# Patient Record
Sex: Male | Born: 1945
Health system: Southern US, Community
[De-identification: ages and names within clinical notes are randomized; demographics above are authoritative.]

## PROBLEM LIST (undated history)

## (undated) DIAGNOSIS — R7303 Prediabetes: Secondary | ICD-10-CM

## (undated) DIAGNOSIS — D494 Neoplasm of unspecified behavior of bladder: Secondary | ICD-10-CM

## (undated) DIAGNOSIS — M1990 Unspecified osteoarthritis, unspecified site: Secondary | ICD-10-CM

## (undated) DIAGNOSIS — K5790 Diverticulosis of intestine, part unspecified, without perforation or abscess without bleeding: Secondary | ICD-10-CM

## (undated) DIAGNOSIS — G4730 Sleep apnea, unspecified: Secondary | ICD-10-CM

## (undated) DIAGNOSIS — C801 Malignant (primary) neoplasm, unspecified: Secondary | ICD-10-CM

## (undated) DIAGNOSIS — K639 Disease of intestine, unspecified: Secondary | ICD-10-CM

## (undated) DIAGNOSIS — J189 Pneumonia, unspecified organism: Secondary | ICD-10-CM

## (undated) DIAGNOSIS — E785 Hyperlipidemia, unspecified: Secondary | ICD-10-CM

## (undated) DIAGNOSIS — I7121 Aneurysm of the ascending aorta, without rupture: Secondary | ICD-10-CM

## (undated) DIAGNOSIS — I10 Essential (primary) hypertension: Secondary | ICD-10-CM

## (undated) DIAGNOSIS — I2510 Atherosclerotic heart disease of native coronary artery without angina pectoris: Secondary | ICD-10-CM

## (undated) DIAGNOSIS — K5792 Diverticulitis of intestine, part unspecified, without perforation or abscess without bleeding: Secondary | ICD-10-CM

## (undated) DIAGNOSIS — I219 Acute myocardial infarction, unspecified: Secondary | ICD-10-CM

## (undated) HISTORY — PX: ABDOMINAL SURGERY: SHX537

## (undated) HISTORY — PX: COLOSTOMY: SHX63

## (undated) HISTORY — PX: APPENDECTOMY: SHX54

## (undated) HISTORY — PX: LUMBAR LAMINECTOMY: SHX95

## (undated) HISTORY — PX: NESBIT PROCEDURE: SHX2087

## (undated) HISTORY — PX: OTHER SURGICAL HISTORY: SHX169

---

## 1999-09-12 DIAGNOSIS — I471 Supraventricular tachycardia, unspecified: Secondary | ICD-10-CM

## 1999-09-12 HISTORY — DX: Supraventricular tachycardia: I47.1

## 1999-09-12 HISTORY — DX: Supraventricular tachycardia, unspecified: I47.10

## 2005-09-11 DIAGNOSIS — K5792 Diverticulitis of intestine, part unspecified, without perforation or abscess without bleeding: Secondary | ICD-10-CM

## 2005-09-11 HISTORY — DX: Diverticulitis of intestine, part unspecified, without perforation or abscess without bleeding: K57.92

## 2005-09-11 HISTORY — PX: COLOSTOMY REVERSAL: SHX5782

## 2005-09-11 HISTORY — PX: COLOSTOMY: SHX63

## 2005-09-11 HISTORY — PX: ABDOMINAL SURGERY: SHX537

## 2006-09-11 HISTORY — PX: HERNIA REPAIR: SHX51

## 2012-09-11 HISTORY — PX: HERNIA MESH REMOVAL: SHX1745

## 2018-07-05 ENCOUNTER — Inpatient Hospital Stay (HOSPITAL_COMMUNITY)
Admission: EM | Admit: 2018-07-05 | Discharge: 2018-07-08 | DRG: 389 | Disposition: A | Discharge status: Home / Self Care | Payer: Medicare HMO | Attending: Family Medicine | Admitting: Family Medicine

## 2018-07-05 DIAGNOSIS — Z0189 Encounter for other specified special examinations: Secondary | ICD-10-CM

## 2018-07-05 DIAGNOSIS — E860 Dehydration: Secondary | ICD-10-CM | POA: Diagnosis present

## 2018-07-05 DIAGNOSIS — Z7952 Long term (current) use of systemic steroids: Secondary | ICD-10-CM

## 2018-07-05 DIAGNOSIS — K5651 Intestinal adhesions [bands], with partial obstruction: Secondary | ICD-10-CM | POA: Diagnosis not present

## 2018-07-05 DIAGNOSIS — Z9049 Acquired absence of other specified parts of digestive tract: Secondary | ICD-10-CM

## 2018-07-05 DIAGNOSIS — I313 Pericardial effusion (noninflammatory): Secondary | ICD-10-CM

## 2018-07-05 DIAGNOSIS — Z87891 Personal history of nicotine dependence: Secondary | ICD-10-CM

## 2018-07-05 DIAGNOSIS — N401 Enlarged prostate with lower urinary tract symptoms: Secondary | ICD-10-CM | POA: Diagnosis present

## 2018-07-05 DIAGNOSIS — I10 Essential (primary) hypertension: Secondary | ICD-10-CM | POA: Diagnosis present

## 2018-07-05 DIAGNOSIS — I3139 Other pericardial effusion (noninflammatory): Secondary | ICD-10-CM

## 2018-07-05 DIAGNOSIS — N3289 Other specified disorders of bladder: Secondary | ICD-10-CM | POA: Diagnosis present

## 2018-07-05 DIAGNOSIS — Z79899 Other long term (current) drug therapy: Secondary | ICD-10-CM

## 2018-07-05 DIAGNOSIS — N329 Bladder disorder, unspecified: Secondary | ICD-10-CM | POA: Diagnosis present

## 2018-07-05 DIAGNOSIS — R739 Hyperglycemia, unspecified: Secondary | ICD-10-CM | POA: Diagnosis present

## 2018-07-05 DIAGNOSIS — Z933 Colostomy status: Secondary | ICD-10-CM

## 2018-07-05 DIAGNOSIS — N390 Urinary tract infection, site not specified: Secondary | ICD-10-CM | POA: Diagnosis present

## 2018-07-05 DIAGNOSIS — N179 Acute kidney failure, unspecified: Secondary | ICD-10-CM | POA: Diagnosis present

## 2018-07-05 DIAGNOSIS — E1165 Type 2 diabetes mellitus with hyperglycemia: Secondary | ICD-10-CM | POA: Diagnosis present

## 2018-07-05 DIAGNOSIS — K56609 Unspecified intestinal obstruction, unspecified as to partial versus complete obstruction: Secondary | ICD-10-CM | POA: Diagnosis present

## 2018-07-05 DIAGNOSIS — D72829 Elevated white blood cell count, unspecified: Secondary | ICD-10-CM | POA: Diagnosis present

## 2018-07-05 DIAGNOSIS — R1032 Left lower quadrant pain: Secondary | ICD-10-CM | POA: Diagnosis not present

## 2018-07-05 HISTORY — DX: Diverticulitis of intestine, part unspecified, without perforation or abscess without bleeding: K57.92

## 2018-07-05 HISTORY — DX: Essential (primary) hypertension: I10

## 2018-07-05 HISTORY — DX: Diverticulosis of intestine, part unspecified, without perforation or abscess without bleeding: K57.90

## 2018-07-06 ENCOUNTER — Emergency Department (HOSPITAL_COMMUNITY): Payer: Medicare HMO

## 2018-07-06 ENCOUNTER — Other Ambulatory Visit: Payer: Self-pay

## 2018-07-06 ENCOUNTER — Encounter (HOSPITAL_COMMUNITY): Payer: Self-pay | Admitting: Emergency Medicine

## 2018-07-06 ENCOUNTER — Inpatient Hospital Stay (HOSPITAL_COMMUNITY): Payer: Medicare HMO

## 2018-07-06 DIAGNOSIS — K56609 Unspecified intestinal obstruction, unspecified as to partial versus complete obstruction: Secondary | ICD-10-CM | POA: Diagnosis not present

## 2018-07-06 DIAGNOSIS — I10 Essential (primary) hypertension: Secondary | ICD-10-CM | POA: Diagnosis present

## 2018-07-06 DIAGNOSIS — D72829 Elevated white blood cell count, unspecified: Secondary | ICD-10-CM | POA: Diagnosis present

## 2018-07-06 DIAGNOSIS — I313 Pericardial effusion (noninflammatory): Secondary | ICD-10-CM | POA: Diagnosis present

## 2018-07-06 DIAGNOSIS — Z7952 Long term (current) use of systemic steroids: Secondary | ICD-10-CM | POA: Diagnosis not present

## 2018-07-06 DIAGNOSIS — R1032 Left lower quadrant pain: Secondary | ICD-10-CM | POA: Diagnosis present

## 2018-07-06 DIAGNOSIS — I361 Nonrheumatic tricuspid (valve) insufficiency: Secondary | ICD-10-CM | POA: Diagnosis not present

## 2018-07-06 DIAGNOSIS — Z9049 Acquired absence of other specified parts of digestive tract: Secondary | ICD-10-CM | POA: Diagnosis not present

## 2018-07-06 DIAGNOSIS — K5651 Intestinal adhesions [bands], with partial obstruction: Secondary | ICD-10-CM | POA: Diagnosis present

## 2018-07-06 DIAGNOSIS — Z933 Colostomy status: Secondary | ICD-10-CM | POA: Diagnosis not present

## 2018-07-06 DIAGNOSIS — Z87891 Personal history of nicotine dependence: Secondary | ICD-10-CM | POA: Diagnosis not present

## 2018-07-06 DIAGNOSIS — E860 Dehydration: Secondary | ICD-10-CM | POA: Diagnosis present

## 2018-07-06 DIAGNOSIS — N401 Enlarged prostate with lower urinary tract symptoms: Secondary | ICD-10-CM | POA: Diagnosis present

## 2018-07-06 DIAGNOSIS — N3289 Other specified disorders of bladder: Secondary | ICD-10-CM | POA: Diagnosis present

## 2018-07-06 DIAGNOSIS — N179 Acute kidney failure, unspecified: Secondary | ICD-10-CM | POA: Diagnosis present

## 2018-07-06 DIAGNOSIS — N390 Urinary tract infection, site not specified: Secondary | ICD-10-CM | POA: Diagnosis present

## 2018-07-06 DIAGNOSIS — E1165 Type 2 diabetes mellitus with hyperglycemia: Secondary | ICD-10-CM | POA: Diagnosis present

## 2018-07-06 DIAGNOSIS — R739 Hyperglycemia, unspecified: Secondary | ICD-10-CM | POA: Diagnosis present

## 2018-07-06 DIAGNOSIS — I3139 Other pericardial effusion (noninflammatory): Secondary | ICD-10-CM

## 2018-07-06 DIAGNOSIS — Z79899 Other long term (current) drug therapy: Secondary | ICD-10-CM | POA: Diagnosis not present

## 2018-07-06 DIAGNOSIS — N329 Bladder disorder, unspecified: Secondary | ICD-10-CM | POA: Diagnosis present

## 2018-07-06 LAB — COMPREHENSIVE METABOLIC PANEL
ALBUMIN: 4.6 g/dL (ref 3.5–5.0)
ALK PHOS: 93 U/L (ref 38–126)
ALT: 18 U/L (ref 0–44)
ANION GAP: 16 — AB (ref 5–15)
AST: 18 U/L (ref 15–41)
BILIRUBIN TOTAL: 1.1 mg/dL (ref 0.3–1.2)
BUN: 48 mg/dL — ABNORMAL HIGH (ref 8–23)
CHLORIDE: 94 mmol/L — AB (ref 98–111)
CO2: 27 mmol/L (ref 22–32)
Calcium: 11.1 mg/dL — ABNORMAL HIGH (ref 8.9–10.3)
Creatinine, Ser: 2.71 mg/dL — ABNORMAL HIGH (ref 0.61–1.24)
GFR calc non Af Amer: 22 mL/min — ABNORMAL LOW (ref >60)
GFR, EST AFRICAN AMERICAN: 26 mL/min — AB (ref >60)
Glucose, Bld: 188 mg/dL — ABNORMAL HIGH (ref 70–99)
Potassium: 4.8 mmol/L (ref 3.5–5.1)
SODIUM: 137 mmol/L (ref 135–145)
Total Protein: 8.6 g/dL — ABNORMAL HIGH (ref 6.5–8.1)

## 2018-07-06 LAB — BASIC METABOLIC PANEL
Anion gap: 14 (ref 5–15)
BUN: 61 mg/dL — AB (ref 8–23)
CALCIUM: 9.2 mg/dL (ref 8.9–10.3)
CHLORIDE: 101 mmol/L (ref 98–111)
CO2: 23 mmol/L (ref 22–32)
Creatinine, Ser: 4.04 mg/dL — ABNORMAL HIGH (ref 0.61–1.24)
GFR, EST AFRICAN AMERICAN: 16 mL/min — AB (ref >60)
GFR, EST NON AFRICAN AMERICAN: 14 mL/min — AB (ref >60)
Glucose, Bld: 122 mg/dL — ABNORMAL HIGH (ref 70–99)
POTASSIUM: 4.2 mmol/L (ref 3.5–5.1)
SODIUM: 138 mmol/L (ref 135–145)

## 2018-07-06 LAB — CBC WITH DIFFERENTIAL/PLATELET
ABS IMMATURE GRANULOCYTES: 0.07 10*3/uL (ref 0.00–0.07)
BASOS PCT: 0 %
Basophils Absolute: 0 10*3/uL (ref 0.0–0.1)
Eosinophils Absolute: 0.2 10*3/uL (ref 0.0–0.5)
Eosinophils Relative: 1 %
HCT: 42.1 % (ref 39.0–52.0)
HEMOGLOBIN: 13.3 g/dL (ref 13.0–17.0)
Immature Granulocytes: 1 %
LYMPHS PCT: 10 %
Lymphs Abs: 1.1 10*3/uL (ref 0.7–4.0)
MCH: 27.7 pg (ref 26.0–34.0)
MCHC: 31.6 g/dL (ref 30.0–36.0)
MCV: 87.5 fL (ref 80.0–100.0)
MONO ABS: 1 10*3/uL (ref 0.1–1.0)
Monocytes Relative: 8 %
NEUTROS PCT: 80 %
Neutro Abs: 9.3 10*3/uL — ABNORMAL HIGH (ref 1.7–7.7)
PLATELETS: 268 10*3/uL (ref 150–400)
RBC: 4.81 MIL/uL (ref 4.22–5.81)
RDW: 14.2 % (ref 11.5–15.5)
WBC: 11.6 10*3/uL — AB (ref 4.0–10.5)
nRBC: 0 % (ref 0.0–0.2)

## 2018-07-06 LAB — CBC
HEMATOCRIT: 45.7 % (ref 39.0–52.0)
Hemoglobin: 15 g/dL (ref 13.0–17.0)
MCH: 28.6 pg (ref 26.0–34.0)
MCHC: 32.8 g/dL (ref 30.0–36.0)
MCV: 87 fL (ref 80.0–100.0)
NRBC: 0 % (ref 0.0–0.2)
Platelets: 319 10*3/uL (ref 150–400)
RBC: 5.25 MIL/uL (ref 4.22–5.81)
RDW: 13.8 % (ref 11.5–15.5)
WBC: 18.9 10*3/uL — ABNORMAL HIGH (ref 4.0–10.5)

## 2018-07-06 LAB — CBG MONITORING, ED: GLUCOSE-CAPILLARY: 125 mg/dL — AB (ref 70–99)

## 2018-07-06 LAB — HEMOGLOBIN A1C
HEMOGLOBIN A1C: 6.4 % — AB (ref 4.8–5.6)
Mean Plasma Glucose: 136.98 mg/dL

## 2018-07-06 LAB — LIPASE, BLOOD: Lipase: 37 U/L (ref 11–51)

## 2018-07-06 MED ORDER — DIATRIZOATE MEGLUMINE & SODIUM 66-10 % PO SOLN
90.0000 mL | Freq: Once | ORAL | Status: AC
  Administered 2018-07-06: 90 mL via NASOGASTRIC
  Filled 2018-07-06: qty 90

## 2018-07-06 MED ORDER — FAMOTIDINE IN NACL 20-0.9 MG/50ML-% IV SOLN
20.0000 mg | Freq: Two times a day (BID) | INTRAVENOUS | Status: DC
  Administered 2018-07-06 – 2018-07-07 (×3): 20 mg via INTRAVENOUS
  Filled 2018-07-06 (×3): qty 50

## 2018-07-06 MED ORDER — LIDOCAINE VISCOUS HCL 2 % MT SOLN
OROMUCOSAL | Status: AC
  Filled 2018-07-06: qty 15

## 2018-07-06 MED ORDER — SODIUM CHLORIDE 0.9 % IV BOLUS
1000.0000 mL | Freq: Once | INTRAVENOUS | Status: AC
  Administered 2018-07-06: 1000 mL via INTRAVENOUS

## 2018-07-06 MED ORDER — LACTATED RINGERS IV BOLUS
1000.0000 mL | Freq: Once | INTRAVENOUS | Status: AC
  Administered 2018-07-06: 1000 mL via INTRAVENOUS

## 2018-07-06 MED ORDER — IOPAMIDOL (ISOVUE-300) INJECTION 61%
INTRAVENOUS | Status: AC
  Filled 2018-07-06: qty 50

## 2018-07-06 MED ORDER — HYDROMORPHONE HCL 1 MG/ML IJ SOLN
0.5000 mg | INTRAMUSCULAR | Status: AC | PRN
  Administered 2018-07-06: 1 mg via INTRAVENOUS
  Filled 2018-07-06: qty 1

## 2018-07-06 MED ORDER — ONDANSETRON HCL 4 MG/2ML IJ SOLN
4.0000 mg | Freq: Three times a day (TID) | INTRAMUSCULAR | Status: AC | PRN
  Administered 2018-07-06: 4 mg via INTRAVENOUS
  Filled 2018-07-06: qty 2

## 2018-07-06 MED ORDER — FAMOTIDINE IN NACL 20-0.9 MG/50ML-% IV SOLN
20.0000 mg | Freq: Once | INTRAVENOUS | Status: AC
  Administered 2018-07-06: 20 mg via INTRAVENOUS
  Filled 2018-07-06: qty 50

## 2018-07-06 MED ORDER — SODIUM CHLORIDE 0.9 % IV SOLN
INTRAVENOUS | Status: DC
  Administered 2018-07-06 – 2018-07-08 (×6): via INTRAVENOUS

## 2018-07-06 NOTE — ED Notes (Signed)
Pt states he is resting well. Denies any c/o nausea. States mild abd discomfort remains. Pt noted to be wearing hospital gown and eyeglasses.

## 2018-07-06 NOTE — ED Notes (Addendum)
X-Ray Tech transporting pt to X-ray for NG Tube Placement then will request Transport Team to transport pt to [phone]. Pt verified has all of his belongings - wearing his eyeglasses, clothing w/slippers, cell phone charger, and eyeglass case placed on bed per pt's request.

## 2018-07-06 NOTE — ED Provider Notes (Signed)
[facility] EMERGENCY DEPARTMENT Provider Note   CSN: [id number] Arrival date & time: 07/05/18  2348     History   Chief Complaint Chief Complaint  Patient presents with  . Abdominal Pain    concerned for bowel obstruction    HPI Richard Austin is a 72 y.o. male.  Patient is a 72 year old male who presents with abdominal pain.  He has had a prior history of diverticulitis as well as a prior history of small bowel obstruction.  He had multiple abdominal surgeries.  He complains of a 1 day history of pain in his left lower abdomen.  He has had vomiting which she describes as fecal matter.  He does have some diarrhea as well.  No fevers.  No hematemesis.  He states it feels similar to his prior small bowel obstructions.     Past Medical History:  Diagnosis Date  . Diverticulitis     Patient Active Problem List   Diagnosis Date Noted  . SBO (small bowel obstruction) ([place]) 07/06/2018    Past Surgical History:  Procedure Laterality Date  . ABDOMINAL SURGERY    . COLOSTOMY          Home Medications    Prior to Admission medications   Medication Sig Start Date End Date Taking? Authorizing Provider  chlorthalidone (HYGROTON) 25 MG tablet Take 25 mg by mouth daily.   Yes [provider]  fluticasone (FLONASE) 50 MCG/ACT nasal spray Place 1 spray into both nostrils daily as needed for allergies or rhinitis.   Yes [provider]  hydrocortisone 2.5 % cream Apply 1 application topically 2 (two) times daily.   Yes [provider]  Icosapent Ethyl (VASCEPA) 1 g CAPS Take 1 capsule by mouth 2 (two) times daily.   Yes [provider]  Javier Docker Oil 350 MG CAPS Take 350 mg by mouth 2 (two) times daily.   Yes [provider]  lisinopril (PRINIVIL,ZESTRIL) 40 MG tablet Take 40 mg by mouth daily.   Yes [provider]  meloxicam (MOBIC) 15 MG tablet Take 15 mg by mouth daily.   Yes [provider]    omeprazole (PRILOSEC) 20 MG capsule Take 40 mg by mouth daily as needed (acid reflux).   Yes [provider]  tadalafil (ADCIRCA/CIALIS) 20 MG tablet Take 20 mg by mouth daily as needed for erectile dysfunction.   Yes [provider]  tamsulosin (FLOMAX) 0.4 MG CAPS capsule Take 0.4 mg by mouth daily after supper.   Yes [provider]    Family History No family history on file.  Social History Social History   Tobacco Use  . Smoking status: Not on file  Substance Use Topics  . Alcohol use: Not Currently  . Drug use: Not Currently     Allergies   Amlodipine; Bystolic [nebivolol hcl]; Statins; and Latex   Review of Systems Review of Systems  Constitutional: Negative for chills, diaphoresis, fatigue and fever.  HENT: Negative for congestion, rhinorrhea and sneezing.   Eyes: Negative.   Respiratory: Negative for cough, chest tightness and shortness of breath.   Cardiovascular: Negative for chest pain and leg swelling.  Gastrointestinal: Positive for abdominal pain, nausea and vomiting. Negative for blood in stool and diarrhea.  Genitourinary: Negative for difficulty urinating, flank pain, frequency and hematuria.  Musculoskeletal: Negative for arthralgias and back pain.  Skin: Negative for rash.  Neurological: Negative for dizziness, speech difficulty, weakness, numbness and headaches.     Physical Exam  Updated Vital Signs BP 104/62 (BP Location: Right Arm)   Pulse 79   Temp 98.9 F (37.2 C)   Resp 18   Ht 5\' 8"  (1.727 m)   Wt 82.1 kg   SpO2 94%   BMI 27.52 kg/m   Physical Exam  Constitutional: He is oriented to person, place, and time. He appears well-developed and well-nourished.  HENT:  Head: Normocephalic and atraumatic.  Eyes: Pupils are equal, round, and reactive to light.  Neck: Normal range of motion. Neck supple.  Cardiovascular: Normal rate, regular rhythm and normal heart sounds.  Pulmonary/Chest: Effort normal and breath  sounds normal. No respiratory distress. He has no wheezes. He has no rales. He exhibits no tenderness.  Abdominal: Soft. Bowel sounds are normal. There is tenderness in the left lower quadrant. There is no rebound and no guarding.  Musculoskeletal: Normal range of motion. He exhibits no edema.  Lymphadenopathy:    He has no cervical adenopathy.  Neurological: He is alert and oriented to person, place, and time.  Skin: Skin is warm and dry. No rash noted.  Psychiatric: He has a normal mood and affect.     ED Treatments / Results  Labs (all labs ordered are listed, but only abnormal results are displayed) Labs Reviewed  COMPREHENSIVE METABOLIC PANEL - Abnormal; Notable for the following components:      Result Value   Chloride 94 (*)    Glucose, Bld 188 (*)    BUN 48 (*)    Creatinine, Ser 2.71 (*)    Calcium 11.1 (*)    Total Protein 8.6 (*)    GFR calc non Af Amer 22 (*)    GFR calc Af Amer 26 (*)    Anion gap 16 (*)    All other components within normal limits  CBC - Abnormal; Notable for the following components:   WBC 18.9 (*)    All other components within normal limits  LIPASE, BLOOD  URINALYSIS, ROUTINE W REFLEX MICROSCOPIC    EKG None  Radiology Ct Abdomen Pelvis Wo Contrast  Result Date: 07/06/2018 CLINICAL DATA:  72 year old male with left lower quadrant abdominal pain, nausea and vomiting. EXAM: CT ABDOMEN AND PELVIS WITHOUT CONTRAST TECHNIQUE: Multidetector CT imaging of the abdomen and pelvis was performed following the standard protocol without IV contrast. COMPARISON:  Abdominal radiograph dated 07/06/2018 FINDINGS: Evaluation of this exam is limited in the absence of intravenous contrast. Lower chest: There is a 6 mm right lower lobe subpleural nodule. The visualized lung bases are clear. Partially visualized pericardial effusion measuring 17 mm anterior to the heart. There is calcification of the mitral annulus. No intra-abdominal free air or free fluid.  Hepatobiliary: No focal liver abnormality is seen. No gallstones, gallbladder wall thickening, or biliary dilatation. Pancreas: Unremarkable. No pancreatic ductal dilatation or surrounding inflammatory changes. Spleen: Normal in size without focal abnormality. Adrenals/Urinary Tract: The adrenal glands are unremarkable. Multiple nonobstructing bilateral renal calculi measure up to 3-4 mm. No hydronephrosis. The visualized ureters appear unremarkable. There is a 2.3 x 1.7 cm partially calcified mass within the urinary bladder along the posterior wall adjacent to the right ureterovesical junction highly concerning for a bladder neoplasm. Further evaluation with cystoscopy is recommended. Stomach/Bowel: There is a small hiatal hernia. The stomach is distended with fluid content. There is dilatation of fluid-filled loops of proximal and mid small bowel measuring up to 4.3 cm in diameter. The distal small bowel loops are collapsed. A transition is noted in the left lower  quadrant. There are postsurgical changes of partial sigmoid resection with anastomotic suture. There is colonic diverticulosis without active inflammatory changes. Vascular/Lymphatic: Moderate aortoiliac atherosclerotic disease. No portal venous gas. There is no adenopathy. Reproductive: Mildly enlarged prostate gland measuring 5.3 cm in transverse diameter. Other: Fat containing left inguinal hernia with probable left inguinal hernia repair plug. Postsurgical changes and scarring of the midline anterior pelvic wall. Musculoskeletal: Degenerative changes of the spine. No acute osseous pathology. IMPRESSION: 1. Small-bowel obstruction with transition in the left lower quadrant, likely secondary to adhesions. 2. Partially visualized pericardial effusion measuring 17 mm anterior to the heart. 3. Bilateral nonobstructing renal calculi. No hydronephrosis. 4. Partially calcified mass in the urinary bladder concerning for bladder neoplasm. Further evaluation  with cystoscopy is recommended. 5. Colonic diverticulosis without active inflammatory changes. Electronically Signed   By: Anner Crete M.D.   On: 07/06/2018 05:54   Dg Abdomen 1 View  Result Date: 07/06/2018 CLINICAL DATA:  Abdominal pain EXAM: ABDOMEN - 1 VIEW COMPARISON:  None. FINDINGS: There is moderate stool in the colon. There is no bowel dilatation or air-fluid level to suggest bowel obstruction. No free air. There are surgical clips in the left abdomen. There are calcifications in the lower pelvis, likely of prostatic etiology. There is degenerative change in the lumbar spine. IMPRESSION: No evident bowel obstruction or free air. Moderate stool in colon. Prostatic calculi noted. Postoperative changes noted in left abdomen. Electronically Signed   By: Lowella Grip III M.D.   On: 07/06/2018 00:27    Procedures Procedures (including critical care time)  Medications Ordered in ED Medications  sodium chloride 0.9 % bolus 1,000 mL (has no administration in time range)  HYDROmorphone (DILAUDID) injection 0.5-1 mg (has no administration in time range)  ondansetron (ZOFRAN) injection 4 mg (has no administration in time range)     Initial Impression / Assessment and Plan / ED Course  I have reviewed the triage vital signs and the nursing notes.  Pertinent labs & imaging results that were available during my care of the patient were reviewed by me and considered in my medical decision making (see chart for details).     Patient presents with abdominal pain and vomiting.  CT scan shows evidence of a small bowel obstruction.  His labs show an acute kidney injury.  We do not have any old labs available for comparison but patient denies any known history of kidney dysfunction.  He was given IV fluids.  I spoke with Dr. Redmond Pulling with general surgery who will see the patient.  He requested medicine admission.  I spoke with Dr. Myna Hidalgo with the hospitalist service who will admit the patient for  further treatment.  NG tube was attempted by nursing and unsuccessful.  Final Clinical Impressions(s) / ED Diagnoses   Final diagnoses:  SBO (small bowel obstruction) ([place])  AKI (acute kidney injury) [facility])    ED Discharge Orders    None       Malvin Johns, MD 07/06/18 0710

## 2018-07-06 NOTE — Consult Note (Addendum)
[facility] Surgery Consult Note  Richard Austin [birth year]  [id number].    Requesting MD: Tamera Punt, MD Chief Complaint/Reason for Consult: SBO  HPI:  Richard Austin is a pleasant 72 year old male with a past medical history of hypertension, SBO, and diverticulitis who presented to [facility] emergency department with 24 hours of left lower quadrant abdominal pain.  Patient states that the pain started yesterday morning/afternoon.  Pain described as sharp, nonradiating, temporary relieved after vomiting.  He developed nausea, vomiting, and diarrhea last night.  Also endorses belching and denies flatus.  Patient reports similar episodes in the past, stating this is his fourth small bowel obstruction in the past 3 to 4 years.  He reports 2 previous hospitalizations in 2015 and 2016 for small bowel obstruction that resolved with nasogastric decompression and nonoperative management.    He reports a surgical history of sigmoid colectomy in 8250 complicated by anastomotic leak, take back to the OR and postop day 1 for colostomy. He also has a history of colostomy takedown. He developed a ventral hernia that was repaired with mesh and the mesh became chronically infected. His most recent abdominal surgery was an abdominal wall reconstruction in 2014 for his large ventral hernia.   The patient is a former smoker who quit roughly 15 years ago.  States he drinks alcohol occasionally but not daily.  Denies illicit drug use.  Is currently employed as a Presenter, broadcasting.  He and his wife moved to [place] in May 2019 from [place].  ROS: Review of Systems  Constitutional: Negative for chills and fever.  Gastrointestinal: Positive for abdominal pain, diarrhea, nausea and vomiting. Negative for blood in stool and melena.  All other systems reviewed and are negative.   History reviewed. No pertinent family history.  Past Medical History:  Diagnosis Date  . Diverticulitis   . Diverticulosis   .  Hypertension     Past Surgical History:  Procedure Laterality Date  . ABDOMINAL SURGERY    . COLOSTOMY      Social History:  reports that he drank alcohol. He reports that he has current or past drug history. His tobacco history is not on file.  Allergies:  Allergies  Allergen Reactions  . Amlodipine Other (See Comments)    Muscle tightness, fatigue   . Bystolic [Nebivolol Hcl] Other (See Comments)    Muscle tightness and fatigue   . Statins Other (See Comments)    Muscle tightness and fatigue   . Latex Rash     (Not in a hospital admission)  Blood pressure (!) 120/58, pulse 75, temperature 98.9 F (37.2 C), resp. rate 18, height _0  (1.727 m), weight 82.1 kg, SpO2 96 %. Physical Exam: Physical Exam  Constitutional: He is oriented to person, place, and time. He appears well-developed and well-nourished.  Non-toxic appearance. He does not appear ill.  HENT:  Head: Normocephalic and atraumatic.  Mouth/Throat: Oropharynx is clear and moist.  Eyes: Pupils are equal, round, and reactive to light. EOM are normal. No scleral icterus.  Cardiovascular: Normal rate, regular rhythm, normal heart sounds and intact distal pulses. Exam reveals no gallop and no friction rub.  No murmur heard. Pulmonary/Chest: Effort normal and breath sounds normal. No stridor. No respiratory distress. He has no wheezes. He has no rhonchi. He has no rales.  Abdominal: He exhibits distension. There is tenderness in the left lower quadrant. There is no rebound.  Tinkering bowel sounds in the right hemiabdomen; tender to palpation of left lower quadrant with  palpable underlying small bowel distention.  There is no peritonitis  Neurological: He is alert and oriented to person, place, and time.  Skin: Skin is warm and dry. No rash noted.  Psychiatric: He has a normal mood and affect. His behavior is normal.    Results for orders placed or performed during the hospital encounter of 07/05/18 (from the past 48  hour(s))  Lipase, blood     Status: None   Collection Time: 07/06/18 12:05 AM  Result Value Ref Range   Lipase 37 11 - 51 U/L    Comment: Performed at [facility], [address], [place], [place] [address]  Comprehensive metabolic panel     Status: Abnormal   Collection Time: 07/06/18 12:05 AM  Result Value Ref Range   Sodium 137 135 - 145 mmol/L   Potassium 4.8 3.5 - 5.1 mmol/L   Chloride 94 (L) 98 - 111 mmol/L   CO2 27 22 - 32 mmol/L   Glucose, Bld 188 (H) 70 - 99 mg/dL   BUN 48 (H) 8 - 23 mg/dL   Creatinine, Ser 2.71 (H) 0.61 - 1.24 mg/dL   Calcium 11.1 (H) 8.9 - 10.3 mg/dL   Total Protein 8.6 (H) 6.5 - 8.1 g/dL   Albumin 4.6 3.5 - 5.0 g/dL   AST 18 15 - 41 U/L   ALT 18 0 - 44 U/L   Alkaline Phosphatase 93 38 - 126 U/L   Total Bilirubin 1.1 0.3 - 1.2 mg/dL   GFR calc non Af Amer 22 (L) >60 mL/min   GFR calc Af Amer 26 (L) >60 mL/min    Comment: (NOTE) The eGFR has been calculated using the CKD EPI equation. This calculation has not been validated in all clinical situations. eGFR's persistently <60 mL/min signify possible Chronic Kidney Disease.    Anion gap 16 (H) 5 - 15    Comment: Performed at [facility], [place] [address] [place], [place] [address]  CBC     Status: Abnormal   Collection Time: 07/06/18 12:05 AM  Result Value Ref Range   WBC 18.9 (H) 4.0 - 10.5 K/uL   RBC 5.25 4.22 - 5.81 MIL/uL   Hemoglobin 15.0 13.0 - 17.0 g/dL   HCT 45.7 39.0 - 52.0 %   MCV 87.0 80.0 - 100.0 fL   MCH 28.6 26.0 - 34.0 pg   MCHC 32.8 30.0 - 36.0 g/dL   RDW 13.8 11.5 - 15.5 %   Platelets 319 150 - 400 K/uL   nRBC 0.0 0.0 - 0.2 %    Comment: Performed at [facility], [place] [address], [place], [place] [address]   Ct Abdomen Pelvis Wo Contrast  Result Date: 07/06/2018 CLINICAL DATA:  72 year old male with left lower quadrant abdominal pain, nausea and vomiting. EXAM: CT ABDOMEN AND PELVIS WITHOUT CONTRAST TECHNIQUE: Multidetector CT imaging of the abdomen and  pelvis was performed following the standard protocol without IV contrast. COMPARISON:  Abdominal radiograph dated 07/06/2018 FINDINGS: Evaluation of this exam is limited in the absence of intravenous contrast. Lower chest: There is a 6 mm right lower lobe subpleural nodule. The visualized lung bases are clear. Partially visualized pericardial effusion measuring 17 mm anterior to the heart. There is calcification of the mitral annulus. No intra-abdominal free air or free fluid. Hepatobiliary: No focal liver abnormality is seen. No gallstones, gallbladder wall thickening, or biliary dilatation. Pancreas: Unremarkable. No pancreatic ductal dilatation or surrounding inflammatory changes. Spleen: Normal in size without  focal abnormality. Adrenals/Urinary Tract: The adrenal glands are unremarkable. Multiple nonobstructing bilateral renal calculi measure up to 3-4 mm. No hydronephrosis. The visualized ureters appear unremarkable. There is a 2.3 x 1.7 cm partially calcified mass within the urinary bladder along the posterior wall adjacent to the right ureterovesical junction highly concerning for a bladder neoplasm. Further evaluation with cystoscopy is recommended. Stomach/Bowel: There is a small hiatal hernia. The stomach is distended with fluid content. There is dilatation of fluid-filled loops of proximal and mid small bowel measuring up to 4.3 cm in diameter. The distal small bowel loops are collapsed. A transition is noted in the left lower quadrant. There are postsurgical changes of partial sigmoid resection with anastomotic suture. There is colonic diverticulosis without active inflammatory changes. Vascular/Lymphatic: Moderate aortoiliac atherosclerotic disease. No portal venous gas. There is no adenopathy. Reproductive: Mildly enlarged prostate gland measuring 5.3 cm in transverse diameter. Other: Fat containing left inguinal hernia with probable left inguinal hernia repair plug. Postsurgical changes and scarring  of the midline anterior pelvic wall. Musculoskeletal: Degenerative changes of the spine. No acute osseous pathology. IMPRESSION: 1. Small-bowel obstruction with transition in the left lower quadrant, likely secondary to adhesions. 2. Partially visualized pericardial effusion measuring 17 mm anterior to the heart. 3. Bilateral nonobstructing renal calculi. No hydronephrosis. 4. Partially calcified mass in the urinary bladder concerning for bladder neoplasm. Further evaluation with cystoscopy is recommended. 5. Colonic diverticulosis without active inflammatory changes. Electronically Signed   By: Anner Crete M.D.   On: 07/06/2018 05:54   Dg Abdomen 1 View  Result Date: 07/06/2018 CLINICAL DATA:  Abdominal pain EXAM: ABDOMEN - 1 VIEW COMPARISON:  None. FINDINGS: There is moderate stool in the colon. There is no bowel dilatation or air-fluid level to suggest bowel obstruction. No free air. There are surgical clips in the left abdomen. There are calcifications in the lower pelvis, likely of prostatic etiology. There is degenerative change in the lumbar spine. IMPRESSION: No evident bowel obstruction or free air. Moderate stool in colon. Prostatic calculi noted. Postoperative changes noted in left abdomen. Electronically Signed   By: Lowella Grip III M.D.   On: 07/06/2018 00:27   Assessment/Plan HTN PMH Diverticulitis Bladder mass - will need urology F/U  AKI - Cr 2.7 per primary team Leukocytosis  SBO, recurrent, suspect secondary to adhesive disease - past abd surgeries:  Sigmoid colectomy >>leak>>colostomy in 2007, ventral hernia repair with mesh >>mesh infection>>persistent hernia, colostomy takedown, abdominal wall reconstruction 2014 by plastic surgery. - afebrile, VSS - CT abd/pelv 10/26 - small HH, SBO w/ transition zone LLQ, fat containing LIH - place NG tube and start small bowel protocol; initial NG tube placement in the ER failed.  I have asked another nurse to attempt NG  placement if this fails I recommend sending the patient to fluoroscopy for placement of nasogastric tube. -General surgery will follow  FEN - NPO. IVF, NG to LIWS ID - no abx recommended from surgical perspective VTE - SCDs, ok for chemical VTE from [place], [facility] Surgery 07/06/2018, 8:24 AM Pager: [phone] Consults: [phone]

## 2018-07-06 NOTE — ED Triage Notes (Addendum)
Pt reports L sided abd pain relieved with pressure applied to abd, emesis and distention starting today. States he thinks he was vomiting stool earlier today. Hs extensive bowel reconstruction and diverticulitis, bowel obstruction in the past. Last normal BM this morning. Poor PO intake today.

## 2018-07-06 NOTE — ED Notes (Signed)
Attempted NG tube x3 without success. MD Belfi made aware.

## 2018-07-06 NOTE — ED Notes (Signed)
Gen surg rounding

## 2018-07-06 NOTE — H&P (Addendum)
History and Physical    Richard Austin VVO:[id number] DOB: June 08, 1946 DOA: 07/05/2018  PCP: Pa, Eunice   Patient coming from: Home.  I have personally briefly reviewed patient's old medical records in [place]  Chief Complaint: Abdominal pain.  HPI: Richard Austin is a 72 y.o. male with medical history significant of diverticulosis, history of diverticulitis, history of SBO x3 in the past, hypertension who is coming to the emergency department with complaints of abdominal pain since yesterday about 1000 associated with nausea, 10-15 episodes of emesis and 3-4 episodes of diarrhea.  He states that he felt nauseous most of the day on Friday at work, but did not produce emesis until he started driving home, but had to pulled over and vomit.  Since then, he has had at least 10 more episodes of emesis.  He also started having diarrhea.  He mentions that his last regular/normal bowel movement was yesterday morning.  He denies fever, chills, sore throat, rhinorrhea, dyspnea, wheezing, hemoptysis, chest pain, palpitations, dizziness, diaphoresis, PND, orthopnea or pitting edema of the lower extremities.  No melena or hematochezia.  Denies dysuria, frequency or hematuria, but states that he is urinating less volume than usual.  His urine looks darker than usual.  He denies polyuria, polydipsia, polyphagia or blurred vision.  No heat or cold intolerance and denies skin pruritus.  ED Course: Initial vital signs temperature 90 F, pulse 100, respirations 18, blood pressure 122/82 mmHg and O2 sat 96% on room air.  The patient received a 1000 mL NS bolus and famotidine 20 mg IVPB in the emergency department.  His white count was 18.9, hemoglobin 15.0 g/dL and platelets 319.  CMP shows a chloride of 94 mmol/L.  Glucose 188, BUN 48, creatinine 2.71 and calcium 11.6 mg/dL.  Total protein was 8.6 g/dL, but this is secondary to hemoconcentration.  Lipase was 37.  Imaging: shows SBO with  transition in the left lower quadrant likely secondary to adhesions.  There was a partially visualized pericardial effusion in the anterior heart.  Please see images and full radiology report for further detail.  There is a partially calcified mass in the urinary bladder concerning for bladder neoplasm.  Review of Systems: As per HPI otherwise 10 point review of systems negative.   Past Medical History:  Diagnosis Date  . Diverticulitis   . Diverticulosis   . Hypertension     Past Surgical History:  Procedure Laterality Date  . ABDOMINAL SURGERY    . APPENDECTOMY  1970s  . COLOSTOMY    . LUMBAR LAMINECTOMY  1970s     reports that he drank alcohol. He reports that he has current or past drug history. His tobacco history is not on file.  Allergies  Allergen Reactions  . Amlodipine Other (See Comments)    Muscle tightness, fatigue   . Bystolic [Nebivolol Hcl] Other (See Comments)    Muscle tightness and fatigue   . Statins Other (See Comments)    Muscle tightness and fatigue   . Latex Rash    Family History  Problem Relation Age of Onset  . Peptic Ulcer Mother   . Cirrhosis Mother   . Alcohol abuse Mother   . Bladder Cancer Father   . Throat cancer Maternal Grandfather    Prior to Admission medications   Medication Sig Start Date End Date Taking? Authorizing Provider  chlorthalidone (HYGROTON) 25 MG tablet Take 25 mg by mouth daily.   Yes [provider]  fluticasone Asencion Islam)  50 MCG/ACT nasal spray Place 1 spray into both nostrils daily as needed for allergies or rhinitis.   Yes [provider]  hydrocortisone 2.5 % cream Apply 1 application topically 2 (two) times daily.   Yes [provider]  Icosapent Ethyl (VASCEPA) 1 g CAPS Take 1 capsule by mouth 2 (two) times daily.   Yes [provider]  Javier Docker Oil 350 MG CAPS Take 350 mg by mouth 2 (two) times daily.   Yes [provider]  lisinopril (PRINIVIL,ZESTRIL) 40 MG tablet Take  40 mg by mouth daily.   Yes [provider]  meloxicam (MOBIC) 15 MG tablet Take 15 mg by mouth daily.   Yes [provider]  omeprazole (PRILOSEC) 20 MG capsule Take 40 mg by mouth daily as needed (acid reflux).   Yes [provider]  tadalafil (ADCIRCA/CIALIS) 20 MG tablet Take 20 mg by mouth daily as needed for erectile dysfunction.   Yes [provider]  tamsulosin (FLOMAX) 0.4 MG CAPS capsule Take 0.4 mg by mouth daily after supper.   Yes [provider]    Physical Exam: Vitals:   07/06/18 0615 07/06/18 0715 07/06/18 0730 07/06/18 0800  BP: (!) 104/57 116/63 126/63 (!) 120/58  Pulse: 81 77 75 75  Resp:      Temp:      TempSrc:      SpO2: 97% 96% 97% 96%  Weight:      Height:        Constitutional: NAD, calm, comfortable Eyes: PERRL, lids and conjunctivae normal ENMT: Mucous membranes are mildly dry. Posterior pharynx clear of any exudate or lesions. Neck: normal, supple, no masses, no thyromegaly Respiratory: Clear to auscultation bilaterally, no wheezing, no crackles. Normal respiratory effort. No accessory muscle use.  Cardiovascular: Regular rate and rhythm, no murmurs / rubs / gallops. No extremity edema. 2+ pedal pulses. No carotid bruits.  Abdomen: Distended, positive left quadrants tenderness, no guarding or rebound.  No masses palpated. No hepatosplenomegaly. Bowel sounds positive.  Musculoskeletal: no clubbing / cyanosis. No joint deformity upper and lower extremities. Good ROM, no contractures. Normal muscle tone.  Skin: no rashes, lesions, ulcers. No induration Neurologic: CN 2-12 grossly intact. Sensation intact, DTR normal. Strength 5/5 in all 4.  Psychiatric: Normal judgment and insight. Alert and oriented x 3. Normal mood.   Labs on Admission: I have personally reviewed following labs and imaging studies  CBC: Recent Labs  Lab 07/06/18 0005  WBC 18.9*  HGB 15.0  HCT 45.7  MCV 87.0  PLT 878   Basic Metabolic  Panel: Recent Labs  Lab 07/06/18 0005  NA 137  K 4.8  CL 94*  CO2 27  GLUCOSE 188*  BUN 48*  CREATININE 2.71*  CALCIUM 11.1*   GFR: Estimated Creatinine Clearance: 26.1 mL/min (A) (by C-G formula based on SCr of 2.71 mg/dL (H)). Liver Function Tests: Recent Labs  Lab 07/06/18 0005  AST 18  ALT 18  ALKPHOS 93  BILITOT 1.1  PROT 8.6*  ALBUMIN 4.6   Recent Labs  Lab 07/06/18 0005  LIPASE 37   No results for input(s): AMMONIA in the last 168 hours. Coagulation Profile: No results for input(s): INR, PROTIME in the last 168 hours. Cardiac Enzymes: No results for input(s): CKTOTAL, CKMB, CKMBINDEX, TROPONINI in the last 168 hours. BNP (last 3 results) No results for input(s): PROBNP in the last 8760 hours. HbA1C: No results for input(s): HGBA1C in the last 72 hours. CBG: Recent Labs  Lab  07/06/18 1009  GLUCAP 125*   Lipid Profile: No results for input(s): CHOL, HDL, LDLCALC, TRIG, CHOLHDL, LDLDIRECT in the last 72 hours. Thyroid Function Tests: No results for input(s): TSH, T4TOTAL, FREET4, T3FREE, THYROIDAB in the last 72 hours. Anemia Panel: No results for input(s): VITAMINB12, FOLATE, FERRITIN, TIBC, IRON, RETICCTPCT in the last 72 hours. Urine analysis: No results found for: COLORURINE, APPEARANCEUR, LABSPEC, PHURINE, GLUCOSEU, HGBUR, BILIRUBINUR, KETONESUR, PROTEINUR, UROBILINOGEN, NITRITE, LEUKOCYTESUR  Radiological Exams on Admission: Ct Abdomen Pelvis Wo Contrast  Result Date: 07/06/2018 CLINICAL DATA:  72 year old male with left lower quadrant abdominal pain, nausea and vomiting. EXAM: CT ABDOMEN AND PELVIS WITHOUT CONTRAST TECHNIQUE: Multidetector CT imaging of the abdomen and pelvis was performed following the standard protocol without IV contrast. COMPARISON:  Abdominal radiograph dated 07/06/2018 FINDINGS: Evaluation of this exam is limited in the absence of intravenous contrast. Lower chest: There is a 6 mm right lower lobe subpleural nodule. The  visualized lung bases are clear. Partially visualized pericardial effusion measuring 17 mm anterior to the heart. There is calcification of the mitral annulus. No intra-abdominal free air or free fluid. Hepatobiliary: No focal liver abnormality is seen. No gallstones, gallbladder wall thickening, or biliary dilatation. Pancreas: Unremarkable. No pancreatic ductal dilatation or surrounding inflammatory changes. Spleen: Normal in size without focal abnormality. Adrenals/Urinary Tract: The adrenal glands are unremarkable. Multiple nonobstructing bilateral renal calculi measure up to 3-4 mm. No hydronephrosis. The visualized ureters appear unremarkable. There is a 2.3 x 1.7 cm partially calcified mass within the urinary bladder along the posterior wall adjacent to the right ureterovesical junction highly concerning for a bladder neoplasm. Further evaluation with cystoscopy is recommended. Stomach/Bowel: There is a small hiatal hernia. The stomach is distended with fluid content. There is dilatation of fluid-filled loops of proximal and mid small bowel measuring up to 4.3 cm in diameter. The distal small bowel loops are collapsed. A transition is noted in the left lower quadrant. There are postsurgical changes of partial sigmoid resection with anastomotic suture. There is colonic diverticulosis without active inflammatory changes. Vascular/Lymphatic: Moderate aortoiliac atherosclerotic disease. No portal venous gas. There is no adenopathy. Reproductive: Mildly enlarged prostate gland measuring 5.3 cm in transverse diameter. Other: Fat containing left inguinal hernia with probable left inguinal hernia repair plug. Postsurgical changes and scarring of the midline anterior pelvic wall. Musculoskeletal: Degenerative changes of the spine. No acute osseous pathology. IMPRESSION: 1. Small-bowel obstruction with transition in the left lower quadrant, likely secondary to adhesions. 2. Partially visualized pericardial effusion  measuring 17 mm anterior to the heart. 3. Bilateral nonobstructing renal calculi. No hydronephrosis. 4. Partially calcified mass in the urinary bladder concerning for bladder neoplasm. Further evaluation with cystoscopy is recommended. 5. Colonic diverticulosis without active inflammatory changes. Electronically Signed   By: Anner Crete M.D.   On: 07/06/2018 05:54   Dg Abdomen 1 View  Result Date: 07/06/2018 CLINICAL DATA:  Abdominal pain EXAM: ABDOMEN - 1 VIEW COMPARISON:  None. FINDINGS: There is moderate stool in the colon. There is no bowel dilatation or air-fluid level to suggest bowel obstruction. No free air. There are surgical clips in the left abdomen. There are calcifications in the lower pelvis, likely of prostatic etiology. There is degenerative change in the lumbar spine. IMPRESSION: No evident bowel obstruction or free air. Moderate stool in colon. Prostatic calculi noted. Postoperative changes noted in left abdomen. Electronically Signed   By: Lowella Grip III M.D.   On: 07/06/2018 00:27    EKG: Independently reviewed.  Assessment/Plan Principal Problem:   SBO (small bowel obstruction) (HCC) Admit to MedSurg/inpatient. Continue IV fluids. Continue NGT suction. Monitor intake and output. Monitor WBC, electrolytes and renal function. General surgery is following.  Active Problems:   AKI (acute kidney injury) ([place]) Continue IV fluids. Monitor intake and output. Follow-up BUN and creatinine. Consult nephrology if he worsens.    Pericardial effusion Check echocardiogram in a.m.    Bladder mass Will need cytoscopy at some point, may be transferred to [facility] for urology to evaluate once the SBO has resolved and he has been cleared by surgery.  His father had bladder CA.    Hypercalcemia I suspect this is due to dehydration and hemoconcentration. Continue IV fluids. Follow-up calcium level. Consider further work-up if hypercalcemia persists.     Hyperglycemia Currently n.p.o. Check hemoglobin A1c.    Hypertension Hold lisinopril. Monitor blood pressure, renal function electrolytes..    Leukocytosis Secondary to hemoconcentration. Continue IV hydration. Follow WBC.   DVT prophylaxis: SCDs. Code Status: Full code. Family Communication: His wife was present in the ED room Disposition Plan: Admit for NGT suction, IV hydration and symptoms management. Consults called: General surgery. Admission status: Inpatient/MedSurg.   Reubin Milan MD [facility] Pager [phone]  If 7PM-7AM, please contact night-coverage [email or web address] Password [email or web address]  07/06/2018, 10:41 AM

## 2018-07-06 NOTE — ED Notes (Signed)
Dr. Olevia Bowens rounding

## 2018-07-06 NOTE — ED Notes (Signed)
Pt arrived to [facility] via stretcher. Pt ambulated to bed w/o difficulty. Spouse at bedside.

## 2018-07-07 LAB — CBC WITH DIFFERENTIAL/PLATELET
Abs Immature Granulocytes: 0.04 10*3/uL (ref 0.00–0.07)
BASOS ABS: 0.1 10*3/uL (ref 0.0–0.1)
BASOS PCT: 1 %
Eosinophils Absolute: 0.4 10*3/uL (ref 0.0–0.5)
Eosinophils Relative: 4 %
HCT: 41.7 % (ref 39.0–52.0)
HEMOGLOBIN: 13 g/dL (ref 13.0–17.0)
IMMATURE GRANULOCYTES: 0 %
LYMPHS PCT: 10 %
Lymphs Abs: 1 10*3/uL (ref 0.7–4.0)
MCH: 27.6 pg (ref 26.0–34.0)
MCHC: 31.2 g/dL (ref 30.0–36.0)
MCV: 88.5 fL (ref 80.0–100.0)
MONO ABS: 0.8 10*3/uL (ref 0.1–1.0)
Monocytes Relative: 8 %
NEUTROS ABS: 7.5 10*3/uL (ref 1.7–7.7)
NEUTROS PCT: 77 %
NRBC: 0 % (ref 0.0–0.2)
PLATELETS: 246 10*3/uL (ref 150–400)
RBC: 4.71 MIL/uL (ref 4.22–5.81)
RDW: 14.2 % (ref 11.5–15.5)
WBC: 9.8 10*3/uL (ref 4.0–10.5)

## 2018-07-07 LAB — COMPREHENSIVE METABOLIC PANEL
ALBUMIN: 3.3 g/dL — AB (ref 3.5–5.0)
ALK PHOS: 73 U/L (ref 38–126)
ALT: 15 U/L (ref 0–44)
AST: 13 U/L — AB (ref 15–41)
Anion gap: 12 (ref 5–15)
BUN: 67 mg/dL — ABNORMAL HIGH (ref 8–23)
CALCIUM: 8.9 mg/dL (ref 8.9–10.3)
CHLORIDE: 103 mmol/L (ref 98–111)
CO2: 26 mmol/L (ref 22–32)
CREATININE: 3.86 mg/dL — AB (ref 0.61–1.24)
GFR calc non Af Amer: 14 mL/min — ABNORMAL LOW (ref >60)
GFR, EST AFRICAN AMERICAN: 17 mL/min — AB (ref >60)
GLUCOSE: 103 mg/dL — AB (ref 70–99)
Potassium: 4 mmol/L (ref 3.5–5.1)
Sodium: 141 mmol/L (ref 135–145)
Total Bilirubin: 1.1 mg/dL (ref 0.3–1.2)
Total Protein: 5.9 g/dL — ABNORMAL LOW (ref 6.5–8.1)

## 2018-07-07 LAB — URINALYSIS, ROUTINE W REFLEX MICROSCOPIC
Bilirubin Urine: NEGATIVE
GLUCOSE, UA: NEGATIVE mg/dL
Ketones, ur: NEGATIVE mg/dL
Nitrite: NEGATIVE
PROTEIN: 30 mg/dL — AB
Specific Gravity, Urine: 1.017 (ref 1.005–1.030)
WBC, UA: >50 WBC/hpf — ABNORMAL HIGH (ref 0–5)
pH: 5 (ref 5.0–8.0)

## 2018-07-07 LAB — GLUCOSE, CAPILLARY
GLUCOSE-CAPILLARY: 73 mg/dL (ref 70–99)
GLUCOSE-CAPILLARY: 93 mg/dL (ref 70–99)

## 2018-07-07 MED ORDER — TAMSULOSIN HCL 0.4 MG PO CAPS
0.4000 mg | ORAL_CAPSULE | Freq: Every day | ORAL | Status: DC
  Administered 2018-07-07: 0.4 mg via ORAL
  Filled 2018-07-07: qty 1

## 2018-07-07 MED ORDER — ACETAMINOPHEN 325 MG PO TABS
650.0000 mg | ORAL_TABLET | Freq: Four times a day (QID) | ORAL | Status: DC | PRN
  Administered 2018-07-07: 650 mg via ORAL
  Filled 2018-07-07: qty 2

## 2018-07-07 MED ORDER — ACETAMINOPHEN 650 MG RE SUPP: 650.0000 mg | Freq: Four times a day (QID) | RECTAL | Status: DC | PRN

## 2018-07-07 MED ORDER — HYDRALAZINE HCL 20 MG/ML IJ SOLN: 10.0000 mg | Freq: Four times a day (QID) | INTRAMUSCULAR | Status: DC | PRN

## 2018-07-07 MED ORDER — SODIUM CHLORIDE 0.9 % IV SOLN
1.0000 g | INTRAVENOUS | Status: DC
  Administered 2018-07-07: 1 g via INTRAVENOUS
  Filled 2018-07-07 (×2): qty 10

## 2018-07-07 MED ORDER — FAMOTIDINE IN NACL 20-0.9 MG/50ML-% IV SOLN: 20.0000 mg | INTRAVENOUS | Status: DC

## 2018-07-07 MED ORDER — HEPARIN SODIUM (PORCINE) 5000 UNIT/ML IJ SOLN
5000.0000 [IU] | Freq: Three times a day (TID) | INTRAMUSCULAR | Status: DC
  Administered 2018-07-07 – 2018-07-08 (×3): 5000 [IU] via SUBCUTANEOUS
  Filled 2018-07-07 (×3): qty 1

## 2018-07-07 NOTE — Progress Notes (Signed)
Patient Demographics:    Richard Austin, is a 72 y.o. male, DOB - 1946-03-08, MGN:[id number]  Admit date - 07/05/2018   Admitting Physician Vianne Bulls, MD  Outpatient Primary MD for the patient is [place], [place]  LOS - 1   Chief Complaint  Patient presents with  . Abdominal Pain    concerned for bowel obstruction        Subjective:    Richard Austin today has no fevers, no further emesis,  No chest pain, abdominal pain and nausea is no worse, had BM, tolerating NG tube okay  Assessment  & Plan :    Principal Problem:   SBO (small bowel obstruction) (HCC) Active Problems:   AKI (acute kidney injury) ([place])   Hyperglycemia   Hypertension   Leukocytosis   Bladder mass   Pericardial effusion   Hypercalcemia  CT Abd/Pelvis- IMPRESSION: 1. Small-bowel obstruction with transition in the left lower quadrant, likely secondary to adhesions. 2. Partially visualized pericardial effusion measuring 17 mm anterior to the heart. 3. Bilateral nonobstructing renal calculi. No hydronephrosis. 4. Partially calcified mass in the urinary bladder concerning for bladder neoplasm. Further evaluation with cystoscopy is recommended. 5. Colonic diverticulosis without active inflammatory changes  Brief Summary   71 y.o. male with medical history significant of diverticulosis, history of diverticulitis, history of SBO x3 in the past, hypertension admitted on 07/06/2018 with persistent nausea vomiting and loose stools as well as abdominal pain and found to have SBO with transition point in the left lower quadrant most likely due to adhesions... At the time of admission there was also concerns about calcified bladder mass and pericardial effusion  Plan:- 1)pSBO, suspect secondary to adhesive disease-   -had bowel movement on 07/06/2018, abdominal distention abdominal pain very slowly improving  with NG tube suction, discussed with general surgery team, past abd surgeries:Sigmoid colectomy >>leak>>colostomy in 2007, ventral hernia repair with mesh >>mesh infection>>persistent hernia, colostomy takedown, abdominal wall reconstruction 2014 by plastic surgery in [place]). - 07/06/18 NG placed in rads, SB Protocol >> contrast diffusely throughout colon - NG 1,050 mL/24 h  -Surgical team recommends NG clamping trial 07/07/18 - check residuals in 4-6 hours  after clamping if pt tolerates clamp trial and residuals <250 mL then D/C NGT and advance diet as tolerated to SOFT.  2)FEN - A1c 6.4, sips/chips, clamp NGT, c/n IVF until oral intake is more reliable  3)Possible UTI--- started on IV Rocephin on 07/07/2018 pending urine culture, abnormal UA could be secondary to dehydration and calcified bladder mass  4)AKI----acute kidney injury  , suspect this is due to dehydration in the setting of small bowel obstruction and GI losses, hold lisinopril, hold chlorthalidone, stop meloxicam,    creatinine on admission= 2.71 (peaked at 4.0) ,   baseline creatinine =  Unknown   , creatinine is now= 3.86 , renally adjust medications, avoid nephrotoxic agents/dehydration/hypotension  5)Calcified Bladder Mass----patient will need outpatient follow-up with urologist for cystoscopy and further evaluation   6)Possible Pericardial Effusion----CT tends to overestimate pericardial effusion, await echocardiogram patient appears hemodynamically stable  7)h/o BPH with LUTS--- restart Flomax, please see #5 above  Disposition/Need for in-Hospital Stay- patient unable to be discharged at this time due to need for IV  fluids given small bowel obstruction with inability to tolerate significant oral intake and significant acute kidney injury which will worsen without IV fluids, also awaiting urine cultures to decide on p.o. antibiotics  Code Status : Full  Disposition Plan  : home  Consults  :  Gen Surgery.... Will  need urology as outpatient may need cardiology depending on echo findings   DVT Prophylaxis  : Heparin    Lab Results  Component Value Date   PLT 246 07/07/2018    Inpatient Medications  Scheduled Meds: . heparin injection (subcutaneous)  5,000 Units Subcutaneous Q8H  . tamsulosin  0.4 mg Oral Daily   Continuous Infusions: . sodium chloride 125 mL/hr at 07/07/18 1222  . cefTRIAXone (ROCEPHIN)  IV 1 g (07/07/18 0828)  . famotidine (PEPCID) IV 20 mg (07/07/18 0829)   PRN Meds:.acetaminophen, acetaminophen    Anti-infectives (From admission, onward)   Start     Dose/Rate Route Frequency Ordered Stop   07/07/18 0900  cefTRIAXone (ROCEPHIN) 1 g in sodium chloride 0.9 % 100 mL IVPB     1 g 200 mL/hr over 30 Minutes Intravenous Every 24 hours 07/07/18 0749          Objective:   Vitals:   07/06/18 1050 07/06/18 1207 07/06/18 1441 07/07/18 0541  BP: (!) 101/58 124/72 114/60 132/70  Pulse: 77 74 72 73  Resp: 18 18 19 19   Temp: 98.3 F (36.8 C) 98.2 F (36.8 C) 97.9 F (36.6 C) 98 F (36.7 C)  TempSrc: Oral Oral Oral Oral  SpO2: 99% 96% 95% 95%  Weight:  80.5 kg    Height:  5' 8.5" (1.74 m)      Wt Readings from Last 3 Encounters:  07/06/18 80.5 kg     Intake/Output Summary (Last 24 hours) at 07/07/2018 1256 Last data filed at 07/07/2018 1030 Gross per 24 hour  Intake 1010.62 ml  Output 1250 ml  Net -239.38 ml   Physical Exam Patient is examined daily including today on 07/07/18 , exams remain the same as of yesterday except that has changed   Gen:- Awake Alert,  In no apparent distress  HEENT:- [place].AT, No sclera icterus Nose- NG tube intermittent wall suction Neck-Supple Neck,No JVD,.  Lungs-  CTAB , fairly symmetrical air movement CV- S1, S2 normal, regular Abd-  +ve B.Sounds, Abd Soft, less distended, abdominal discomfort with palpation without rebound or guarding Extremity/Skin:- No  edema, good pulses Psych-affect is appropriate, oriented  x3 Neuro-no new focal deficits, no tremors   Data Review:   Micro Results No results found for this or any previous visit (from the past 240 hour(s)).  Radiology Reports Ct Abdomen Pelvis Wo Contrast  Result Date: 07/06/2018 CLINICAL DATA:  72 year old male with left lower quadrant abdominal pain, nausea and vomiting. EXAM: CT ABDOMEN AND PELVIS WITHOUT CONTRAST TECHNIQUE: Multidetector CT imaging of the abdomen and pelvis was performed following the standard protocol without IV contrast. COMPARISON:  Abdominal radiograph dated 07/06/2018 FINDINGS: Evaluation of this exam is limited in the absence of intravenous contrast. Lower chest: There is a 6 mm right lower lobe subpleural nodule. The visualized lung bases are clear. Partially visualized pericardial effusion measuring 17 mm anterior to the heart. There is calcification of the mitral annulus. No intra-abdominal free air or free fluid. Hepatobiliary: No focal liver abnormality is seen. No gallstones, gallbladder wall thickening, or biliary dilatation. Pancreas: Unremarkable. No pancreatic ductal dilatation or surrounding inflammatory changes. Spleen: Normal in size without focal abnormality. Adrenals/Urinary Tract: The  adrenal glands are unremarkable. Multiple nonobstructing bilateral renal calculi measure up to 3-4 mm. No hydronephrosis. The visualized ureters appear unremarkable. There is a 2.3 x 1.7 cm partially calcified mass within the urinary bladder along the posterior wall adjacent to the right ureterovesical junction highly concerning for a bladder neoplasm. Further evaluation with cystoscopy is recommended. Stomach/Bowel: There is a small hiatal hernia. The stomach is distended with fluid content. There is dilatation of fluid-filled loops of proximal and mid small bowel measuring up to 4.3 cm in diameter. The distal small bowel loops are collapsed. A transition is noted in the left lower quadrant. There are postsurgical changes of partial  sigmoid resection with anastomotic suture. There is colonic diverticulosis without active inflammatory changes. Vascular/Lymphatic: Moderate aortoiliac atherosclerotic disease. No portal venous gas. There is no adenopathy. Reproductive: Mildly enlarged prostate gland measuring 5.3 cm in transverse diameter. Other: Fat containing left inguinal hernia with probable left inguinal hernia repair plug. Postsurgical changes and scarring of the midline anterior pelvic wall. Musculoskeletal: Degenerative changes of the spine. No acute osseous pathology. IMPRESSION: 1. Small-bowel obstruction with transition in the left lower quadrant, likely secondary to adhesions. 2. Partially visualized pericardial effusion measuring 17 mm anterior to the heart. 3. Bilateral nonobstructing renal calculi. No hydronephrosis. 4. Partially calcified mass in the urinary bladder concerning for bladder neoplasm. Further evaluation with cystoscopy is recommended. 5. Colonic diverticulosis without active inflammatory changes. Electronically Signed   By: Anner Crete M.D.   On: 07/06/2018 05:54   Dg Abdomen 1 View  Result Date: 07/06/2018 CLINICAL DATA:  Abdominal pain EXAM: ABDOMEN - 1 VIEW COMPARISON:  None. FINDINGS: There is moderate stool in the colon. There is no bowel dilatation or air-fluid level to suggest bowel obstruction. No free air. There are surgical clips in the left abdomen. There are calcifications in the lower pelvis, likely of prostatic etiology. There is degenerative change in the lumbar spine. IMPRESSION: No evident bowel obstruction or free air. Moderate stool in colon. Prostatic calculi noted. Postoperative changes noted in left abdomen. Electronically Signed   By: Lowella Grip III M.D.   On: 07/06/2018 00:27   Dg Abd Portable 1v-small Bowel Obstruction Protocol-initial, 8 Hr Delay  Result Date: 07/07/2018 CLINICAL DATA:  Small-bowel obstruction EXAM: PORTABLE ABDOMEN - 1 VIEW COMPARISON:  CT abdomen and  pelvis July 06, 2018 FINDINGS: Oral contrast was administered. By report, this image was obtained 8 hours after oral contrast administration. Nasogastric tube tip and side port are in the stomach. Contrast reaches the colon. Most of the contrast is in the colon. There are loops of mildly dilated small bowel without appreciable air-fluid levels. No free air. IMPRESSION: Contrast seen in colon. There are loops of mildly dilated small bowel. Suspect partially resolving small-bowel obstruction. No free air. Nasogastric tube tip and side port are in the stomach. Electronically Signed   By: Lowella Grip III M.D.   On: 07/07/2018 02:55   Dg Intro Long Gi Tube  Result Date: 07/06/2018 CLINICAL DATA:  Insert NG tube. EXAM: INTRO LONG GI TUBE CONTRAST:  None FLUOROSCOPY TIME:  Fluoroscopy Time:  48 seconds Radiation Exposure Index (if provided by the fluoroscopic device): Not provided Number of Acquired Spot Images: 0 COMPARISON:  None. FINDINGS: The NG tube was inserted under fluoroscopic guidance after lubricating the patient's right nostril with lidocaine jelly. By the end of the study, the distal tip of the NG tube was located in the fundus of the stomach. IMPRESSION: NG tube placement as above.  Electronically Signed   By: Dorise Bullion III M.D   On: 07/06/2018 12:08     CBC Recent Labs  Lab 07/06/18 0005 07/06/18 1308 07/07/18 0225  WBC 18.9* 11.6* 9.8  HGB 15.0 13.3 13.0  HCT 45.7 42.1 41.7  PLT 319 268 246  MCV 87.0 87.5 88.5  MCH 28.6 27.7 27.6  MCHC 32.8 31.6 31.2  RDW 13.8 14.2 14.2  LYMPHSABS  --  1.1 1.0  MONOABS  --  1.0 0.8  EOSABS  --  0.2 0.4  BASOSABS  --  0.0 0.1    Chemistries  Recent Labs  Lab 07/06/18 0005 07/06/18 1308 07/07/18 0225  NA 137 138 141  K 4.8 4.2 4.0  CL 94* 101 103  CO2 27 23 26   GLUCOSE 188* 122* 103*  BUN 48* 61* 67*  CREATININE 2.71* 4.04* 3.86*  CALCIUM 11.1* 9.2 8.9  AST 18  --  13*  ALT 18  --  15  ALKPHOS 93  --  73  BILITOT 1.1   --  1.1   ------------------------------------------------------------------------------------------------------------------ No results for input(s): CHOL, HDL, LDLCALC, TRIG, CHOLHDL, LDLDIRECT in the last 72 hours.  Lab Results  Component Value Date   HGBA1C 6.4 (H) 07/07/2018   ------------------------------------------------------------------------------------------------------------------ No results for input(s): TSH, T4TOTAL, T3FREE, THYROIDAB in the last 72 hours.  Invalid input(s): FREET3 ------------------------------------------------------------------------------------------------------------------ No results for input(s): VITAMINB12, FOLATE, FERRITIN, TIBC, IRON, RETICCTPCT in the last 72 hours.  Coagulation profile No results for input(s): INR, PROTIME in the last 168 hours.  No results for input(s): DDIMER in the last 72 hours.  Cardiac Enzymes No results for input(s): CKMB, TROPONINI, MYOGLOBIN in the last 168 hours.  Invalid input(s): CK ------------------------------------------------------------------------------------------------------------------ No results found for: BNP   Roxan Hockey M.D on 07/07/2018 at 12:56 PM  Pager---[phone] Go to [email or web address] - password [email or web address] for contact info  [facility] - Office  [phone]

## 2018-07-07 NOTE — Progress Notes (Signed)
Patient's NG clamping trial started, patient tolerated clamp trials for 6 hours now, checked residuals and with only 30 mls output, MD made aware , started patient on clear liquid.

## 2018-07-07 NOTE — Progress Notes (Addendum)
[facility] Progress Note     Subjective: CC:  Reports feeling better- still some abdominal soreness but less bloating. Has had 2-3 loose BMs. Reports high NG output yesterday that slowed down significantly after he had a BM yesterday evening.   Objective: Vital signs in last 24 hours: Temp:  [97.9 F (36.6 C)-98.3 F (36.8 C)] 98 F (36.7 C) (10/27 0541) Pulse Rate:  [72-77] 73 (10/27 0541) Resp:  [18-19] 19 (10/27 0541) BP: (101-132)/(58-72) 132/70 (10/27 0541) SpO2:  [95 %-99 %] 95 % (10/27 0541) Weight:  [80.5 kg] 80.5 kg (10/26 1207) Last BM Date: 07/07/18  Intake/Output from previous day: 10/26 0701 - 10/27 0700 In: 2010.6 [I.V.:10.6; IV Piggyback:2000] Out: 1050 [Emesis/NG output:1050] Intake/Output this shift: No intake/output data recorded.  PE: Gen:  Alert, NAD, pleasant Card:  Regular rate and rhythm, pedal pulses 2+ BL Pulm:  Normal effort, clear to auscultation bilaterally Abd: Soft, TTP LLQ without peritonitis, +BS  Skin: warm and dry, no rashes  Psych: A&Ox3   Lab Results:  Recent Labs    07/06/18 1308 07/07/18 0225  WBC 11.6* 9.8  HGB 13.3 13.0  HCT 42.1 41.7  PLT 268 246   BMET Recent Labs    07/06/18 1308 07/07/18 0225  NA 138 141  K 4.2 4.0  CL 101 103  CO2 23 26  GLUCOSE 122* 103*  BUN 61* 67*  CREATININE 4.04* 3.86*  CALCIUM 9.2 8.9   PT/INR No results for input(s): LABPROT, INR in the last 72 hours. CMP     Component Value Date/Time   NA 141 07/07/2018 0225   K 4.0 07/07/2018 0225   CL 103 07/07/2018 0225   CO2 26 07/07/2018 0225   GLUCOSE 103 (H) 07/07/2018 0225   BUN 67 (H) 07/07/2018 0225   CREATININE 3.86 (H) 07/07/2018 0225   CALCIUM 8.9 07/07/2018 0225   PROT 5.9 (L) 07/07/2018 0225   ALBUMIN 3.3 (L) 07/07/2018 0225   AST 13 (L) 07/07/2018 0225   ALT 15 07/07/2018 0225   ALKPHOS 73 07/07/2018 0225   BILITOT 1.1 07/07/2018 0225   GFRNONAA 14 (L) 07/07/2018 0225   GFRAA 17 (L) 07/07/2018 0225    Lipase     Component Value Date/Time   LIPASE 37 07/06/2018 0005       Studies/Results: Ct Abdomen Pelvis Wo Contrast  Result Date: 07/06/2018 CLINICAL DATA:  71 year old male with left lower quadrant abdominal pain, nausea and vomiting. EXAM: CT ABDOMEN AND PELVIS WITHOUT CONTRAST TECHNIQUE: Multidetector CT imaging of the abdomen and pelvis was performed following the standard protocol without IV contrast. COMPARISON:  Abdominal radiograph dated 07/06/2018 FINDINGS: Evaluation of this exam is limited in the absence of intravenous contrast. Lower chest: There is a 6 mm right lower lobe subpleural nodule. The visualized lung bases are clear. Partially visualized pericardial effusion measuring 17 mm anterior to the heart. There is calcification of the mitral annulus. No intra-abdominal free air or free fluid. Hepatobiliary: No focal liver abnormality is seen. No gallstones, gallbladder wall thickening, or biliary dilatation. Pancreas: Unremarkable. No pancreatic ductal dilatation or surrounding inflammatory changes. Spleen: Normal in size without focal abnormality. Adrenals/Urinary Tract: The adrenal glands are unremarkable. Multiple nonobstructing bilateral renal calculi measure up to 3-4 mm. No hydronephrosis. The visualized ureters appear unremarkable. There is a 2.3 x 1.7 cm partially calcified mass within the urinary bladder along the posterior wall adjacent to the right ureterovesical junction highly concerning for a bladder neoplasm. Further evaluation with cystoscopy is recommended.  Stomach/Bowel: There is a small hiatal hernia. The stomach is distended with fluid content. There is dilatation of fluid-filled loops of proximal and mid small bowel measuring up to 4.3 cm in diameter. The distal small bowel loops are collapsed. A transition is noted in the left lower quadrant. There are postsurgical changes of partial sigmoid resection with anastomotic suture. There is colonic diverticulosis  without active inflammatory changes. Vascular/Lymphatic: Moderate aortoiliac atherosclerotic disease. No portal venous gas. There is no adenopathy. Reproductive: Mildly enlarged prostate gland measuring 5.3 cm in transverse diameter. Other: Fat containing left inguinal hernia with probable left inguinal hernia repair plug. Postsurgical changes and scarring of the midline anterior pelvic wall. Musculoskeletal: Degenerative changes of the spine. No acute osseous pathology. IMPRESSION: 1. Small-bowel obstruction with transition in the left lower quadrant, likely secondary to adhesions. 2. Partially visualized pericardial effusion measuring 17 mm anterior to the heart. 3. Bilateral nonobstructing renal calculi. No hydronephrosis. 4. Partially calcified mass in the urinary bladder concerning for bladder neoplasm. Further evaluation with cystoscopy is recommended. 5. Colonic diverticulosis without active inflammatory changes. Electronically Signed   By: Anner Crete M.D.   On: 07/06/2018 05:54   Dg Abdomen 1 View  Result Date: 07/06/2018 CLINICAL DATA:  Abdominal pain EXAM: ABDOMEN - 1 VIEW COMPARISON:  None. FINDINGS: There is moderate stool in the colon. There is no bowel dilatation or air-fluid level to suggest bowel obstruction. No free air. There are surgical clips in the left abdomen. There are calcifications in the lower pelvis, likely of prostatic etiology. There is degenerative change in the lumbar spine. IMPRESSION: No evident bowel obstruction or free air. Moderate stool in colon. Prostatic calculi noted. Postoperative changes noted in left abdomen. Electronically Signed   By: Lowella Grip III M.D.   On: 07/06/2018 00:27   Dg Abd Portable 1v-small Bowel Obstruction Protocol-initial, 8 Hr Delay  Result Date: 07/07/2018 CLINICAL DATA:  Small-bowel obstruction EXAM: PORTABLE ABDOMEN - 1 VIEW COMPARISON:  CT abdomen and pelvis July 06, 2018 FINDINGS: Oral contrast was administered. By report,  this image was obtained 8 hours after oral contrast administration. Nasogastric tube tip and side port are in the stomach. Contrast reaches the colon. Most of the contrast is in the colon. There are loops of mildly dilated small bowel without appreciable air-fluid levels. No free air. IMPRESSION: Contrast seen in colon. There are loops of mildly dilated small bowel. Suspect partially resolving small-bowel obstruction. No free air. Nasogastric tube tip and side port are in the stomach. Electronically Signed   By: Lowella Grip III M.D.   On: 07/07/2018 02:55   Dg Intro Long Gi Tube  Result Date: 07/06/2018 CLINICAL DATA:  Insert NG tube. EXAM: INTRO LONG GI TUBE CONTRAST:  None FLUOROSCOPY TIME:  Fluoroscopy Time:  48 seconds Radiation Exposure Index (if provided by the fluoroscopic device): Not provided Number of Acquired Spot Images: 0 COMPARISON:  None. FINDINGS: The NG tube was inserted under fluoroscopic guidance after lubricating the patient's right nostril with lidocaine jelly. By the end of the study, the distal tip of the NG tube was located in the fundus of the stomach. IMPRESSION: NG tube placement as above. Electronically Signed   By: Dorise Bullion III M.D   On: 07/06/2018 12:08    Anti-infectives: Anti-infectives (From admission, onward)   Start     Dose/Rate Route Frequency Ordered Stop   07/07/18 0900  cefTRIAXone (ROCEPHIN) 1 g in sodium chloride 0.9 % 100 mL IVPB     1  g 200 mL/hr over 30 Minutes Intravenous Every 24 hours 07/07/18 0749       Assessment/Plan HTN PMH Diverticulitis Bladder mass - will need urology F/U  AKI - Cr 2.7 per primary team Leukocytosis  pSBO, suspect secondary to adhesive disease  - past abd surgeries:  Sigmoid colectomy >>leak>>colostomy in 2007, ventral hernia repair with mesh >>mesh infection>>persistent hernia, colostomy takedown, abdominal wall reconstruction 2014 by plastic surgery in [place]). - afebrile, VSS - CT abd/pelv 10/26 -  small HH, SBO w/ transition zone LLQ, fat containing LIH - 10/26 NG placed in rads, SB Protocol >> contrast diffusely throughout colon - NG 1,050 mL/24 h  - NG clamping trial today - check residuals in 4-6 hours (2:30-4 PM). If pt tolerates clamp trial and residuals <250 mL then D/C NGT and advance diet as tolerated to SOFT.  FEN - sips/chips, clamp NGT, IVF ID - no abx recommended from surgical perspective VTE - SCDs, ok for chemical VTE from surgial perspective   LOS: 1 day    Richard Austin, [facility] Surgery Pager: [phone]  Agree with above. His last obstruction was about a year ago.  He is doing better. Wife in room.    Richard Overall, MD, [facility] Surgery Pager: [phone] Office phone:  [phone]

## 2018-07-08 ENCOUNTER — Inpatient Hospital Stay (HOSPITAL_COMMUNITY): Payer: Medicare HMO

## 2018-07-08 ENCOUNTER — Encounter (HOSPITAL_COMMUNITY): Payer: Self-pay | Admitting: *Deleted

## 2018-07-08 DIAGNOSIS — I361 Nonrheumatic tricuspid (valve) insufficiency: Secondary | ICD-10-CM

## 2018-07-08 LAB — COMPREHENSIVE METABOLIC PANEL
ALT: 16 U/L (ref 0–44)
AST: 14 U/L — AB (ref 15–41)
Albumin: 3.1 g/dL — ABNORMAL LOW (ref 3.5–5.0)
Alkaline Phosphatase: 65 U/L (ref 38–126)
Anion gap: 7 (ref 5–15)
BILIRUBIN TOTAL: 0.9 mg/dL (ref 0.3–1.2)
BUN: 51 mg/dL — AB (ref 8–23)
CALCIUM: 8.2 mg/dL — AB (ref 8.9–10.3)
CHLORIDE: 110 mmol/L (ref 98–111)
CO2: 24 mmol/L (ref 22–32)
CREATININE: 1.73 mg/dL — AB (ref 0.61–1.24)
GFR, EST AFRICAN AMERICAN: 44 mL/min — AB (ref >60)
GFR, EST NON AFRICAN AMERICAN: 38 mL/min — AB (ref >60)
Glucose, Bld: 90 mg/dL (ref 70–99)
Potassium: 3.7 mmol/L (ref 3.5–5.1)
Sodium: 141 mmol/L (ref 135–145)
TOTAL PROTEIN: 5.4 g/dL — AB (ref 6.5–8.1)

## 2018-07-08 LAB — CBC WITH DIFFERENTIAL/PLATELET
ABS IMMATURE GRANULOCYTES: 0.03 10*3/uL (ref 0.00–0.07)
BASOS PCT: 1 %
Basophils Absolute: 0 10*3/uL (ref 0.0–0.1)
EOS ABS: 0.3 10*3/uL (ref 0.0–0.5)
Eosinophils Relative: 6 %
HEMATOCRIT: 36.5 % — AB (ref 39.0–52.0)
Hemoglobin: 11.8 g/dL — ABNORMAL LOW (ref 13.0–17.0)
IMMATURE GRANULOCYTES: 1 %
LYMPHS ABS: 1.1 10*3/uL (ref 0.7–4.0)
Lymphocytes Relative: 19 %
MCH: 28.5 pg (ref 26.0–34.0)
MCHC: 32.3 g/dL (ref 30.0–36.0)
MCV: 88.2 fL (ref 80.0–100.0)
MONO ABS: 0.7 10*3/uL (ref 0.1–1.0)
Monocytes Relative: 11 %
NEUTROS ABS: 3.8 10*3/uL (ref 1.7–7.7)
Neutrophils Relative %: 62 %
Platelets: 219 10*3/uL (ref 150–400)
RBC: 4.14 MIL/uL — ABNORMAL LOW (ref 4.22–5.81)
RDW: 13.8 % (ref 11.5–15.5)
WBC: 6 10*3/uL (ref 4.0–10.5)
nRBC: 0 % (ref 0.0–0.2)

## 2018-07-08 LAB — URINE CULTURE
CULTURE: NO GROWTH
Special Requests: NORMAL

## 2018-07-08 LAB — BASIC METABOLIC PANEL
ANION GAP: 5 (ref 5–15)
BUN: 40 mg/dL — ABNORMAL HIGH (ref 8–23)
CALCIUM: 8.4 mg/dL — AB (ref 8.9–10.3)
CO2: 27 mmol/L (ref 22–32)
CREATININE: 1.45 mg/dL — AB (ref 0.61–1.24)
Chloride: 109 mmol/L (ref 98–111)
GFR, EST AFRICAN AMERICAN: 54 mL/min — AB (ref >60)
GFR, EST NON AFRICAN AMERICAN: 47 mL/min — AB (ref >60)
Glucose, Bld: 115 mg/dL — ABNORMAL HIGH (ref 70–99)
Potassium: 4.3 mmol/L (ref 3.5–5.1)
SODIUM: 141 mmol/L (ref 135–145)

## 2018-07-08 LAB — ECHOCARDIOGRAM COMPLETE
HEIGHTINCHES: 68.5 in
Weight: 2839.52 oz

## 2018-07-08 LAB — GLUCOSE, CAPILLARY
GLUCOSE-CAPILLARY: 133 mg/dL — AB (ref 70–99)
Glucose-Capillary: 91 mg/dL (ref 70–99)

## 2018-07-08 NOTE — Discharge Summary (Signed)
Richard Austin, is a 72 y.o. male  DOB Jul 20, 1946  MRN [id number].  Admission date:  07/05/2018  Admitting Physician  Vianne Bulls, MD  Discharge Date:  07/08/2018   Primary MD  Denzil Magnuson, NP  Recommendations for primary care physician for things to follow:   1) GI soft diet advised----drink plenty fluids, okay to use stool softeners/laxatives as needed 2) follow-up with General surgery as advised 3) hold chlorthalidone/diuretic, hold lisinopril/ACE inhibitor, hold meloxicam due to kidney concerns 4) repeat BMP/kidney and electrolyte test with your regular doctor within the next 4 to 5 days 5)Avoid ibuprofen/Advil/Aleve/Motrin/Goody Powders/Naproxen/BC powders/Meloxicam/Diclofenac/Indomethacin and other Nonsteroidal anti-inflammatory medications as these will make you more likely to bleed and can cause stomach ulcers, can also cause Kidney problems.  6)Calcified Bladder Mass----patient will need outpatient follow-up with urologist for cystoscopy and further evaluation   Admission Diagnosis  Small bowel obstruction ([place]) [K56.609] SBO (small bowel obstruction) ([place]) [K56.609] Encounter for imaging study to confirm nasogastric (NG) tube placement [Z01.89] AKI (acute kidney injury) ([place]) [N17.9]   Discharge Diagnosis  Small bowel obstruction ([place]) [K56.609] SBO (small bowel obstruction) ([place]) [K56.609] Encounter for imaging study to confirm nasogastric (NG) tube placement [Z01.89] AKI (acute kidney injury) ([place]) [N17.9]    Principal Problem:   SBO (small bowel obstruction) ([place]) Active Problems:   AKI (acute kidney injury) ([place])   Hyperglycemia   Hypertension   Leukocytosis   Bladder mass   Pericardial effusion   Hypercalcemia     Past Medical History:  Diagnosis Date  . Diverticulitis   . Diverticulosis   . Hypertension     Past Surgical History:  Procedure Laterality Date  .  ABDOMINAL SURGERY    . APPENDECTOMY  1970s  . COLOSTOMY    . LUMBAR LAMINECTOMY  1970s     HPI  from the history and physical done on the day of admission:    Chief Complaint: Abdominal pain.  HPI: Richard Austin is a 72 y.o. male with medical history significant of diverticulosis, history of diverticulitis, history of SBO x3 in the past, hypertension who is coming to the emergency department with complaints of abdominal pain since yesterday about 1000 associated with nausea, 10-15 episodes of emesis and 3-4 episodes of diarrhea.  He states that he felt nauseous most of the day on Friday at work, but did not produce emesis until he started driving home, but had to pulled over and vomit.  Since then, he has had at least 10 more episodes of emesis.  He also started having diarrhea.  He mentions that his last regular/normal bowel movement was yesterday morning.  He denies fever, chills, sore throat, rhinorrhea, dyspnea, wheezing, hemoptysis, chest pain, palpitations, dizziness, diaphoresis, PND, orthopnea or pitting edema of the lower extremities.  No melena or hematochezia.  Denies dysuria, frequency or hematuria, but states that he is urinating less volume than usual.  His urine looks darker than usual.  He denies polyuria, polydipsia, polyphagia or blurred vision.  No heat or cold intolerance and denies  skin pruritus.  ED Course: Initial vital signs temperature 90 F, pulse 100, respirations 18, blood pressure 122/82 mmHg and O2 sat 96% on room air.  The patient received a 1000 mL NS bolus and famotidine 20 mg IVPB in the emergency department.  His white count was 18.9, hemoglobin 15.0 g/dL and platelets 319.  CMP shows a chloride of 94 mmol/L.  Glucose 188, BUN 48, creatinine 2.71 and calcium 11.6 mg/dL.  Total protein was 8.6 g/dL, but this is secondary to hemoconcentration.  Lipase was 37.  Imaging: shows SBO with transition in the left lower quadrant likely secondary to adhesions.  There was a  partially visualized pericardial effusion in the anterior heart.  Please see images and full radiology report for further detail.  There is a partially calcified mass in the urinary bladder concerning for bladder neoplasm.   Hospital Course:     CT Abd/Pelvis- IMPRESSION: 1. Small-bowel obstruction with transition in the left lower quadrant, likely secondary to adhesions. 2. Partially visualized pericardial effusion measuring 17 mm anterior to the heart. 3. Bilateral nonobstructing renal calculi. No hydronephrosis. 4. Partially calcified mass in the urinary bladder concerning for bladder neoplasm. Further evaluation with cystoscopy is recommended. 5. Colonic diverticulosis without active inflammatory changes  Brief Summary  71 y.o.malewith medical history significant ofdiverticulosis, history of diverticulitis, history of SBO x3 in the past, hypertension admitted on 07/06/2018 with persistent nausea vomiting and loose stools as well as abdominal pain and found to have SBO with transition point in the left lower quadrant most likely due to adhesions... At the time of admission there was also concerns about calcified bladder mass and pericardial effusion  Plan:- 1)pSBO, suspect secondary to adhesive disease- -had bowel movement on 07/06/2018, abdominal distention abdominal pain very slowly improving with NG tube suction, discussed with general surgery team, past abd surgeries:Sigmoid colectomy >>leak>>colostomy in 2007, ventral hernia repair with mesh >>mesh infection>>persistent hernia, colostomy takedown, abdominal wall reconstruction 2014 [place] -07/06/18 NG placed in rads, SB Protocol >> contrastdiffusely throughout colon - NG 1,050 mL/24 h -Patient tolerated NG tube clamping trial on 07/07/2018,.  NG tube removed on 07/07/2018, patient eating and drinking fine, having bowel movements and flatus, no abdominal pain no nausea no vomiting patient eager  to go home  2)Dm2/hyperglycemia-A1c 6.4,  stable, dietary modification discussed, no indication for oral hypoglycemics at this time  3)Possible UTI---  treated with IV Rocephin , urine culture negative to date, abnormal UA could be secondary to dehydration and calcified bladder mass  4)AKI----acute kidney injury  , suspect this is due to dehydration in the setting of small bowel obstruction and GI losses, continue to hold lisinopril, continue to hold chlorthalidone, stop meloxicam,    creatinine on admission= 2.71 (peaked at 4.0) ,   baseline creatinine =  Unknown   , creatinine is now= 1.4 , repeat BMP with PCP in the next 4 days  5)Calcified Bladder Mass----patient will need outpatient follow-up with urologist for cystoscopy and further evaluation   6)Possible Pericardial Effusion----CT tends to overestimate pericardial effusion, appears hemodynamically stable, echocardiogram completed official report pending.  Patient is ambulating without dyspnea on exertion, no dizziness, no palpitations, no chest pains, at rest no orthopnea  7)h/o BPH with LUTS--- restart Flomax, please see #5 above   Code Status : Full  Disposition Plan  : home  Consults  :  Gen Surgery....  Discharge Condition: Stable  Follow UP--urology as outpatient due to calcified bladder mass, general surgery as needed  Diet and Activity recommendation:  As advised  Discharge Instructions    Discharge Instructions    Call MD for:  difficulty breathing, headache or visual disturbances   Complete by:  As directed    Call MD for:  extreme fatigue   Complete by:  As directed    Call MD for:  persistant dizziness or light-headedness   Complete by:  As directed    Call MD for:  persistant nausea and vomiting   Complete by:  As directed    Call MD for:  severe uncontrolled pain   Complete by:  As directed    Call MD for:  temperature >100.4   Complete by:  As directed    Diet - low sodium heart healthy    Complete by:  As directed    Discharge instructions   Complete by:  As directed    1) GI soft diet advised----drink plenty fluids, okay to use stool softeners/laxatives as needed 2) follow-up with general surgery as advised 3) hold chlorthalidone/diuretic, hold lisinopril/ACE inhibitor hold meloxicam due to kidney concerns 4) repeat BMP/kidney and electrolyte test with your regular doctor within the next 4 to 5 days 5)Avoid ibuprofen/Advil/Aleve/Motrin/Goody Powders/Naproxen/BC powders/Meloxicam/Diclofenac/Indomethacin and other Nonsteroidal anti-inflammatory medications as these will make you more likely to bleed and can cause stomach ulcers, can also cause Kidney problems.  6)Calcified Bladder Mass----patient will need outpatient follow-up with urologist for cystoscopy and further evaluation   Increase activity slowly   Complete by:  As directed         Discharge Medications     Allergies as of 07/08/2018      Reactions   Amlodipine Other (See Comments)   Muscle tightness, fatigue    Bystolic [nebivolol Hcl] Other (See Comments)   Muscle tightness and fatigue    Statins Other (See Comments)   Muscle tightness and fatigue    Latex Rash      Medication List    STOP taking these medications   chlorthalidone 25 MG tablet Commonly known as:  HYGROTON   lisinopril 40 MG tablet Commonly known as:  PRINIVIL,ZESTRIL   meloxicam 15 MG tablet Commonly known as:  MOBIC     TAKE these medications   fluticasone 50 MCG/ACT nasal spray Commonly known as:  FLONASE Place 1 spray into both nostrils daily as needed for allergies or rhinitis.   hydrocortisone 2.5 % cream Apply 1 application topically 2 (two) times daily.   Krill Oil 350 MG Caps Take 350 mg by mouth 2 (two) times daily.   omeprazole 20 MG capsule Commonly known as:  PRILOSEC Take 40 mg by mouth daily as needed (acid reflux).   tadalafil 20 MG tablet Commonly known as:  ADCIRCA/CIALIS Take 20 mg by mouth daily  as needed for erectile dysfunction.   tamsulosin 0.4 MG Caps capsule Commonly known as:  FLOMAX Take 0.4 mg by mouth daily after supper.   VASCEPA 1 g Caps Generic drug:  Icosapent Ethyl Take 1 capsule by mouth 2 (two) times daily.       Major procedures and Radiology Reports - PLEASE review detailed and final reports for all details, in brief -   Ct Abdomen Pelvis Wo Contrast  Result Date: 07/06/2018 CLINICAL DATA:  72 year old male with left lower quadrant abdominal pain, nausea and vomiting. EXAM: CT ABDOMEN AND PELVIS WITHOUT CONTRAST TECHNIQUE: Multidetector CT imaging of the abdomen and pelvis was performed following the standard protocol without IV contrast. COMPARISON:  Abdominal radiograph dated 07/06/2018 FINDINGS: Evaluation  of this exam is limited in the absence of intravenous contrast. Lower chest: There is a 6 mm right lower lobe subpleural nodule. The visualized lung bases are clear. Partially visualized pericardial effusion measuring 17 mm anterior to the heart. There is calcification of the mitral annulus. No intra-abdominal free air or free fluid. Hepatobiliary: No focal liver abnormality is seen. No gallstones, gallbladder wall thickening, or biliary dilatation. Pancreas: Unremarkable. No pancreatic ductal dilatation or surrounding inflammatory changes. Spleen: Normal in size without focal abnormality. Adrenals/Urinary Tract: The adrenal glands are unremarkable. Multiple nonobstructing bilateral renal calculi measure up to 3-4 mm. No hydronephrosis. The visualized ureters appear unremarkable. There is a 2.3 x 1.7 cm partially calcified mass within the urinary bladder along the posterior wall adjacent to the right ureterovesical junction highly concerning for a bladder neoplasm. Further evaluation with cystoscopy is recommended. Stomach/Bowel: There is a small hiatal hernia. The stomach is distended with fluid content. There is dilatation of fluid-filled loops of proximal and mid  small bowel measuring up to 4.3 cm in diameter. The distal small bowel loops are collapsed. A transition is noted in the left lower quadrant. There are postsurgical changes of partial sigmoid resection with anastomotic suture. There is colonic diverticulosis without active inflammatory changes. Vascular/Lymphatic: Moderate aortoiliac atherosclerotic disease. No portal venous gas. There is no adenopathy. Reproductive: Mildly enlarged prostate gland measuring 5.3 cm in transverse diameter. Other: Fat containing left inguinal hernia with probable left inguinal hernia repair plug. Postsurgical changes and scarring of the midline anterior pelvic wall. Musculoskeletal: Degenerative changes of the spine. No acute osseous pathology. IMPRESSION: 1. Small-bowel obstruction with transition in the left lower quadrant, likely secondary to adhesions. 2. Partially visualized pericardial effusion measuring 17 mm anterior to the heart. 3. Bilateral nonobstructing renal calculi. No hydronephrosis. 4. Partially calcified mass in the urinary bladder concerning for bladder neoplasm. Further evaluation with cystoscopy is recommended. 5. Colonic diverticulosis without active inflammatory changes. Electronically Signed   By: Anner Crete M.D.   On: 07/06/2018 05:54   Dg Abdomen 1 View  Result Date: 07/06/2018 CLINICAL DATA:  Abdominal pain EXAM: ABDOMEN - 1 VIEW COMPARISON:  None. FINDINGS: There is moderate stool in the colon. There is no bowel dilatation or air-fluid level to suggest bowel obstruction. No free air. There are surgical clips in the left abdomen. There are calcifications in the lower pelvis, likely of prostatic etiology. There is degenerative change in the lumbar spine. IMPRESSION: No evident bowel obstruction or free air. Moderate stool in colon. Prostatic calculi noted. Postoperative changes noted in left abdomen. Electronically Signed   By: Lowella Grip III M.D.   On: 07/06/2018 00:27   Dg Abd Portable  1v-small Bowel Obstruction Protocol-initial, 8 Hr Delay  Result Date: 07/07/2018 CLINICAL DATA:  Small-bowel obstruction EXAM: PORTABLE ABDOMEN - 1 VIEW COMPARISON:  CT abdomen and pelvis July 06, 2018 FINDINGS: Oral contrast was administered. By report, this image was obtained 8 hours after oral contrast administration. Nasogastric tube tip and side port are in the stomach. Contrast reaches the colon. Most of the contrast is in the colon. There are loops of mildly dilated small bowel without appreciable air-fluid levels. No free air. IMPRESSION: Contrast seen in colon. There are loops of mildly dilated small bowel. Suspect partially resolving small-bowel obstruction. No free air. Nasogastric tube tip and side port are in the stomach. Electronically Signed   By: Lowella Grip III M.D.   On: 07/07/2018 02:55   Dg Intro Long Gi Tube  Result  Date: 07/06/2018 CLINICAL DATA:  Insert NG tube. EXAM: INTRO LONG GI TUBE CONTRAST:  None FLUOROSCOPY TIME:  Fluoroscopy Time:  48 seconds Radiation Exposure Index (if provided by the fluoroscopic device): Not provided Number of Acquired Spot Images: 0 COMPARISON:  None. FINDINGS: The NG tube was inserted under fluoroscopic guidance after lubricating the patient's right nostril with lidocaine jelly. By the end of the study, the distal tip of the NG tube was located in the fundus of the stomach. IMPRESSION: NG tube placement as above. Electronically Signed   By: Dorise Bullion III M.D   On: 07/06/2018 12:08    Micro Results   Recent Results (from the past 240 hour(s))  Urine Culture     Status: None   Collection Time: 07/07/18  7:50 AM  Result Value Ref Range Status   Specimen Description URINE, CLEAN CATCH  Final   Special Requests Normal  Final   Culture   Final    NO GROWTH Performed at [facility], [place] [address], [place], [place] [address]    Report Status 07/08/2018 FINAL  Final       Today   Subjective    Jakobe Blau today has  no new concerns, ambulating the hallways without dizziness, no palpitations, no chest pains, no dyspnea on exertion, no pleuritic symptoms, no neck pain, patient is eating and drinking well, had a couple bowel movements, passing gas, no abdominal pain no nausea no vomiting  Patient is eager to go home, echocardiogram done, official echo report pending, patient requesting discharge home, patient agrees to call back in a.m. to get official report of his echo          Patient has been seen and examined prior to discharge   Objective   Blood pressure 125/66, pulse 70, temperature (!) 97.5 F (36.4 C), temperature source Oral, resp. rate 18, height 5' 8.5" (1.74 m), weight 80.5 kg, SpO2 99 %.   Intake/Output Summary (Last 24 hours) at 07/08/2018 1800 Last data filed at 07/08/2018 0600 Gross per 24 hour  Intake 0 ml  Output 350 ml  Net -350 ml    Exam Patient is examined daily including today on 07/08/18 , exams remain the same as of yesterday except that has changed   Gen:- Awake Alert,  In no apparent distress  HEENT:- .AT, No sclera icterus Neck-Supple Neck,No JVD,.  Lungs-  CTAB , fairly symmetrical air movement CV- S1, S2 normal, regular Abd-  +ve B.Sounds, Abd Soft, ND, NT Extremity/Skin:- No  edema, good pulses Psych-affect is appropriate, oriented x3 Neuro-no new focal deficits, no tremors   Data Review   CBC w Diff:  Lab Results  Component Value Date   WBC 6.0 07/08/2018   HGB 11.8 (L) 07/08/2018   HCT 36.5 (L) 07/08/2018   PLT 219 07/08/2018   LYMPHOPCT 19 07/08/2018   MONOPCT 11 07/08/2018   EOSPCT 6 07/08/2018   BASOPCT 1 07/08/2018    CMP:  Lab Results  Component Value Date   NA 141 07/08/2018   K 4.3 07/08/2018   CL 109 07/08/2018   CO2 27 07/08/2018   BUN 40 (H) 07/08/2018   CREATININE 1.45 (H) 07/08/2018   PROT 5.4 (L) 07/08/2018   ALBUMIN 3.1 (L) 07/08/2018   BILITOT 0.9 07/08/2018   ALKPHOS 65 07/08/2018   AST 14 (L) 07/08/2018   ALT 16  07/08/2018  . Total Discharge time is about 33 minutes  Roxan Hockey M.D on 07/08/2018 at 6:00 PM  Pager---[phone]  Go to [email or web address] - password [email or web address] for contact info  [facility] - Office  [phone]

## 2018-07-08 NOTE — Discharge Instructions (Signed)
1) GI soft diet advised----drink plenty fluids, okay to use stool softeners/laxatives as needed 2) follow-up with general surgery as advised 3) hold chlorthalidone/diuretic, hold lisinopril/ACE inhibitor hold meloxicam due to kidney concerns 4) repeat BMP/kidney and electrolyte test with your regular doctor within the next 4 to 5 days 5)Avoid ibuprofen/Advil/Aleve/Motrin/Goody Powders/Naproxen/BC powders/Meloxicam/Diclofenac/Indomethacin and other Nonsteroidal anti-inflammatory medications as these will make you more likely to bleed and can cause stomach ulcers, can also cause Kidney problems.  6)Calcified Bladder Mass----patient will need outpatient follow-up with urologist for cystoscopy and further evaluation

## 2018-07-08 NOTE — Procedures (Signed)
Patient requested that physician speak to him about reason for ordering test before having test performed.

## 2018-07-08 NOTE — Progress Notes (Signed)
[facility] Progress Note     Subjective: CC:  Abdominal pain and distention improved. Having BMs and flatus. Denies nausea or vomiting.  Objective: Vital signs in last 24 hours: Temp:  [98.1 F (36.7 C)-98.5 F (36.9 C)] 98.1 F (36.7 C) (10/28 0434) Pulse Rate:  [64-78] 64 (10/28 0434) Resp:  [15-16] 16 (10/28 0434) BP: (128-143)/(65-70) 130/70 (10/28 0434) SpO2:  [96 %-97 %] 96 % (10/28 0434) Last BM Date: 07/07/18  Intake/Output from previous day: 10/27 0701 - 10/28 0700 In: 112 [P.O.:112] Out: 980 [Urine:550; Emesis/NG output:230; Stool:200] Intake/Output this shift: No intake/output data recorded.  PE: Gen:  Alert, NAD, pleasant Card:  Regular rate and rhythm, pedal pulses 2+ BL Pulm:  Normal effort, clear to auscultation bilaterally Abd: Soft, mild mostly subjective LLQ tenderness, ND, +BS  Skin: warm and dry, no rashes  Psych: A&Ox3   Lab Results:  Recent Labs    07/07/18 0225 07/08/18 0154  WBC 9.8 6.0  HGB 13.0 11.8*  HCT 41.7 36.5*  PLT 246 219   BMET Recent Labs    07/07/18 0225 07/08/18 0154  NA 141 141  K 4.0 3.7  CL 103 110  CO2 26 24  GLUCOSE 103* 90  BUN 67* 51*  CREATININE 3.86* 1.73*  CALCIUM 8.9 8.2*   PT/INR No results for input(s): LABPROT, INR in the last 72 hours. CMP     Component Value Date/Time   NA 141 07/08/2018 0154   K 3.7 07/08/2018 0154   CL 110 07/08/2018 0154   CO2 24 07/08/2018 0154   GLUCOSE 90 07/08/2018 0154   BUN 51 (H) 07/08/2018 0154   CREATININE 1.73 (H) 07/08/2018 0154   CALCIUM 8.2 (L) 07/08/2018 0154   PROT 5.4 (L) 07/08/2018 0154   ALBUMIN 3.1 (L) 07/08/2018 0154   AST 14 (L) 07/08/2018 0154   ALT 16 07/08/2018 0154   ALKPHOS 65 07/08/2018 0154   BILITOT 0.9 07/08/2018 0154   GFRNONAA 38 (L) 07/08/2018 0154   GFRAA 44 (L) 07/08/2018 0154   Lipase     Component Value Date/Time   LIPASE 37 07/06/2018 0005       Studies/Results: Dg Abd Portable 1v-small Bowel Obstruction  Protocol-initial, 8 Hr Delay  Result Date: 07/07/2018 CLINICAL DATA:  Small-bowel obstruction EXAM: PORTABLE ABDOMEN - 1 VIEW COMPARISON:  CT abdomen and pelvis July 06, 2018 FINDINGS: Oral contrast was administered. By report, this image was obtained 8 hours after oral contrast administration. Nasogastric tube tip and side port are in the stomach. Contrast reaches the colon. Most of the contrast is in the colon. There are loops of mildly dilated small bowel without appreciable air-fluid levels. No free air. IMPRESSION: Contrast seen in colon. There are loops of mildly dilated small bowel. Suspect partially resolving small-bowel obstruction. No free air. Nasogastric tube tip and side port are in the stomach. Electronically Signed   By: Lowella Grip III M.D.   On: 07/07/2018 02:55   Dg Intro Long Gi Tube  Result Date: 07/06/2018 CLINICAL DATA:  Insert NG tube. EXAM: INTRO LONG GI TUBE CONTRAST:  None FLUOROSCOPY TIME:  Fluoroscopy Time:  48 seconds Radiation Exposure Index (if provided by the fluoroscopic device): Not provided Number of Acquired Spot Images: 0 COMPARISON:  None. FINDINGS: The NG tube was inserted under fluoroscopic guidance after lubricating the patient's right nostril with lidocaine jelly. By the end of the study, the distal tip of the NG tube was located in the fundus of the stomach. IMPRESSION:  NG tube placement as above. Electronically Signed   By: Dorise Bullion III M.D   On: 07/06/2018 12:08    Anti-infectives: Anti-infectives (From admission, onward)   Start     Dose/Rate Route Frequency Ordered Stop   07/07/18 0900  cefTRIAXone (ROCEPHIN) 1 g in sodium chloride 0.9 % 100 mL IVPB     1 g 200 mL/hr over 30 Minutes Intravenous Every 24 hours 07/07/18 0749         Assessment/Plan HTN PMH Diverticulitis Bladder mass - will need urology F/U AKI- improving  Leukocytosis - resolved  pSBO, suspect secondary to adhesive disease             - past abd  surgeries:Sigmoid colectomy >>leak>>colostomy in 2007, ventral hernia repair with mesh >>mesh infection>>persistent hernia, colostomy takedown, abdominal wall reconstruction 2014 by plastic surgery in [place]). - afebrile, VSS - CT abd/pelv 10/26 - small HH, SBO w/ transition zone LLQ, fat containing LIH - 10/26 NG placed in rads, SB Protocol >> contrast diffusely throughout colon - NG removed and patient tolerating clears, advance to soft diet  - stable for discharge from surgical perspective. Patient knows to call as needed. We discussed chronic pSBO and all of his questions were answered.  FEN - SOFT ID - no abx recommended from surgical perspective VTE - SCDs, SQ heparin    LOS: 2 days    Obie Dredge, [facility] Surgery Pager: [phone]

## 2018-07-08 NOTE — Progress Notes (Signed)
  Echocardiogram 2D Echocardiogram has been performed.  Richard Austin M 07/08/2018, 1:57 PM

## 2018-07-26 ENCOUNTER — Other Ambulatory Visit: Payer: Self-pay | Admitting: Urology

## 2018-08-14 ENCOUNTER — Encounter (HOSPITAL_COMMUNITY): Payer: Self-pay

## 2018-08-14 ENCOUNTER — Other Ambulatory Visit: Payer: Self-pay

## 2018-08-14 ENCOUNTER — Encounter (HOSPITAL_COMMUNITY)
Admission: RE | Admit: 2018-08-14 | Discharge: 2018-08-14 | Disposition: A | Discharge status: Home / Self Care | Payer: Medicare HMO | Source: Ambulatory Visit | Attending: Urology | Admitting: Urology

## 2018-08-14 DIAGNOSIS — Z01818 Encounter for other preprocedural examination: Secondary | ICD-10-CM | POA: Diagnosis not present

## 2018-08-14 DIAGNOSIS — I10 Essential (primary) hypertension: Secondary | ICD-10-CM | POA: Insufficient documentation

## 2018-08-14 DIAGNOSIS — R9431 Abnormal electrocardiogram [ECG] [EKG]: Secondary | ICD-10-CM | POA: Insufficient documentation

## 2018-08-14 HISTORY — DX: Prediabetes: R73.03

## 2018-08-14 HISTORY — DX: Unspecified osteoarthritis, unspecified site: M19.90

## 2018-08-14 HISTORY — DX: Neoplasm of unspecified behavior of bladder: D49.4

## 2018-08-14 HISTORY — DX: Disease of intestine, unspecified: K63.9

## 2018-08-14 LAB — HEMOGLOBIN A1C
Hgb A1c MFr Bld: 6.2 % — ABNORMAL HIGH (ref 4.8–5.6)
Mean Plasma Glucose: 131.24 mg/dL

## 2018-08-14 LAB — BASIC METABOLIC PANEL
ANION GAP: 11 (ref 5–15)
BUN: 27 mg/dL — ABNORMAL HIGH (ref 8–23)
CO2: 24 mmol/L (ref 22–32)
Calcium: 9.7 mg/dL (ref 8.9–10.3)
Chloride: 100 mmol/L (ref 98–111)
Creatinine, Ser: 1 mg/dL (ref 0.61–1.24)
GFR calc Af Amer: >60 mL/min (ref >60)
GFR calc non Af Amer: >60 mL/min (ref >60)
Glucose, Bld: 222 mg/dL — ABNORMAL HIGH (ref 70–99)
Potassium: 4.3 mmol/L (ref 3.5–5.1)
Sodium: 135 mmol/L (ref 135–145)

## 2018-08-14 LAB — CBC
HCT: 44.1 % (ref 39.0–52.0)
Hemoglobin: 14 g/dL (ref 13.0–17.0)
MCH: 28.2 pg (ref 26.0–34.0)
MCHC: 31.7 g/dL (ref 30.0–36.0)
MCV: 88.9 fL (ref 80.0–100.0)
Platelets: 295 10*3/uL (ref 150–400)
RBC: 4.96 MIL/uL (ref 4.22–5.81)
RDW: 13.4 % (ref 11.5–15.5)
WBC: 7.4 10*3/uL (ref 4.0–10.5)
nRBC: 0 % (ref 0.0–0.2)

## 2018-08-14 NOTE — Patient Instructions (Signed)
Richard Austin  08/14/2018   Your procedure is scheduled on: 08-21-18  Report to [facility] Main  Entrance  Report to admitting at 630 AM    Call this number if you have problems the morning of surgery [phone]   Remember: Do not eat food or drink liquids :After Midnight. BRUSH YOUR TEETH MORNING OF SURGERY AND RINSE YOUR MOUTH OUT, NO CHEWING GUM CANDY OR MINTS.     Take these medicines the morning of surgery with A SIP OF WATER: FLONASE NASAL SPRAY IF NEEDED                                You may not have any metal on your body including hair pins and              piercings  Do not wear jewelry, make-up, lotions, powders or perfumes, deodorant             Do not wear nail polish.  Do not shave  48 hours prior to surgery.              Men may shave face and neck.   Do not bring valuables to the hospital. [place].  Contacts, dentures or bridgework may not be worn into surgery.  Leave suitcase in the car. After surgery it may be brought to your room.     Patients discharged the day of surgery will not be allowed to drive home.  Name and phone number of your driver: Eyvonne Left CELL [phone]  Special Instructions: N/A              Please read over the following fact sheets you were given: _____________________________________________________________________             [facility] - Preparing for Surgery Before surgery, you can play an important role.  Because skin is not sterile, your skin needs to be as free of germs as possible.  You can reduce the number of germs on your skin by washing with CHG (chlorahexidine gluconate) soap before surgery.  CHG is an antiseptic cleaner which kills germs and bonds with the skin to continue killing germs even after washing. Please DO NOT use if you have an allergy to CHG or antibacterial soaps.  If your skin becomes reddened/irritated stop using the CHG and  inform your nurse when you arrive at Short Stay. Do not shave (including legs and underarms) for at least 48 hours prior to the first CHG shower.  You may shave your face/neck. Please follow these instructions carefully:  1.  Shower with CHG Soap the night before surgery and the  morning of Surgery.  2.  If you choose to wash your hair, wash your hair first as usual with your  normal  shampoo.  3.  After you shampoo, rinse your hair and body thoroughly to remove the  shampoo.                           4.  Use CHG as you would any other liquid soap.  You can apply chg directly  to the skin and wash  Gently with a scrungie or clean washcloth.  5.  Apply the CHG Soap to your body ONLY FROM THE NECK DOWN.   Do not use on face/ open                           Wound or open sores. Avoid contact with eyes, ears mouth and genitals (private parts).                       Wash face,  Genitals (private parts) with your normal soap.             6.  Wash thoroughly, paying special attention to the area where your surgery  will be performed.  7.  Thoroughly rinse your body with warm water from the neck down.  8.  DO NOT shower/wash with your normal soap after using and rinsing off  the CHG Soap.                9.  Pat yourself dry with a clean towel.            10.  Wear clean pajamas.            11.  Place clean sheets on your bed the night of your first shower and do not  sleep with pets. Day of Surgery : Do not apply any lotions/deodorants the morning of surgery.  Please wear clean clothes to the hospital/surgery center.  FAILURE TO FOLLOW THESE INSTRUCTIONS MAY RESULT IN THE CANCELLATION OF YOUR SURGERY PATIENT SIGNATURE_________________________________  NURSE SIGNATURE__________________________________  ________________________________________________________________________

## 2018-08-15 NOTE — Progress Notes (Signed)
Final EKG done 08/14/2018-epic

## 2018-08-20 NOTE — Anesthesia Preprocedure Evaluation (Addendum)
Anesthesia Evaluation  Patient identified by MRN, date of birth, ID band Patient awake    Reviewed: Allergy & Precautions, NPO status , Patient's Chart, lab work & pertinent test results  History of Anesthesia Complications Negative for: history of anesthetic complications  Airway Mallampati: II  TM Distance: >3 FB Neck ROM: Full    Dental no notable dental hx. (+) Dental Advisory Given   Pulmonary former smoker,    Pulmonary exam normal        Cardiovascular hypertension, Pt. on medications Normal cardiovascular exam     Neuro/Psych negative neurological ROS     GI/Hepatic negative GI ROS, Neg liver ROS,   Endo/Other  negative endocrine ROS  Renal/GU      Musculoskeletal negative musculoskeletal ROS (+)   Abdominal   Peds  Hematology negative hematology ROS (+)   Anesthesia Other Findings Day of surgery medications reviewed with the patient.  Reproductive/Obstetrics                            Anesthesia Physical Anesthesia Plan  ASA: II  Anesthesia Plan: General   Post-op Pain Management:    Induction: Intravenous  PONV Risk Score and Plan: 3 and Ondansetron, Dexamethasone and Diphenhydramine  Airway Management Planned: LMA  Additional Equipment:   Intra-op Plan:   Post-operative Plan: Extubation in OR  Informed Consent: I have reviewed the patients History and Physical, chart, labs and discussed the procedure including the risks, benefits and alternatives for the proposed anesthesia with the patient or authorized representative who has indicated his/her understanding and acceptance.   Dental advisory given  Plan Discussed with: CRNA and Anesthesiologist  Anesthesia Plan Comments:        Anesthesia Quick Evaluation

## 2018-08-21 ENCOUNTER — Ambulatory Visit (HOSPITAL_COMMUNITY): Payer: Medicare HMO | Admitting: Anesthesiology

## 2018-08-21 ENCOUNTER — Ambulatory Visit (HOSPITAL_COMMUNITY)
Admission: RE | Admit: 2018-08-21 | Discharge: 2018-08-21 | Disposition: A | Discharge status: Home / Self Care | Payer: Medicare HMO | Source: Ambulatory Visit | Attending: Urology | Admitting: Urology

## 2018-08-21 ENCOUNTER — Encounter (HOSPITAL_COMMUNITY): Payer: Self-pay

## 2018-08-21 ENCOUNTER — Encounter (HOSPITAL_COMMUNITY)
Admission: RE | Disposition: A | Discharge status: Home / Self Care | Payer: Self-pay | Source: Ambulatory Visit | Attending: Urology

## 2018-08-21 DIAGNOSIS — Z79899 Other long term (current) drug therapy: Secondary | ICD-10-CM | POA: Insufficient documentation

## 2018-08-21 DIAGNOSIS — I10 Essential (primary) hypertension: Secondary | ICD-10-CM | POA: Insufficient documentation

## 2018-08-21 DIAGNOSIS — D494 Neoplasm of unspecified behavior of bladder: Secondary | ICD-10-CM | POA: Diagnosis present

## 2018-08-21 DIAGNOSIS — Z87891 Personal history of nicotine dependence: Secondary | ICD-10-CM | POA: Insufficient documentation

## 2018-08-21 DIAGNOSIS — N529 Male erectile dysfunction, unspecified: Secondary | ICD-10-CM | POA: Insufficient documentation

## 2018-08-21 DIAGNOSIS — C672 Malignant neoplasm of lateral wall of bladder: Secondary | ICD-10-CM | POA: Insufficient documentation

## 2018-08-21 DIAGNOSIS — N5201 Erectile dysfunction due to arterial insufficiency: Secondary | ICD-10-CM | POA: Insufficient documentation

## 2018-08-21 HISTORY — PX: TRANSURETHRAL RESECTION OF BLADDER TUMOR WITH MITOMYCIN-C: SHX6459

## 2018-08-21 LAB — GLUCOSE, CAPILLARY: Glucose-Capillary: 119 mg/dL — ABNORMAL HIGH (ref 70–99)

## 2018-08-21 SURGERY — TRANSURETHRAL RESECTION OF BLADDER TUMOR WITH MITOMYCIN-C
Anesthesia: General | Site: Bladder

## 2018-08-21 MED ORDER — 0.9 % SODIUM CHLORIDE (POUR BTL) OPTIME
TOPICAL | Status: DC | PRN
  Administered 2018-08-21: 1000 mL

## 2018-08-21 MED ORDER — EPHEDRINE SULFATE-NACL 50-0.9 MG/10ML-% IV SOSY
PREFILLED_SYRINGE | INTRAVENOUS | Status: DC | PRN
  Administered 2018-08-21: 5 mg via INTRAVENOUS

## 2018-08-21 MED ORDER — CEFAZOLIN SODIUM-DEXTROSE 2-4 GM/100ML-% IV SOLN
2.0000 g | INTRAVENOUS | Status: AC
  Administered 2018-08-21: 2 g via INTRAVENOUS
  Filled 2018-08-21: qty 100

## 2018-08-21 MED ORDER — DEXAMETHASONE SODIUM PHOSPHATE 10 MG/ML IJ SOLN
INTRAMUSCULAR | Status: AC
  Filled 2018-08-21: qty 1

## 2018-08-21 MED ORDER — SODIUM CHLORIDE 0.9 % IR SOLN
Status: DC | PRN
  Administered 2018-08-21: 6000 mL

## 2018-08-21 MED ORDER — FENTANYL CITRATE (PF) 100 MCG/2ML IJ SOLN
INTRAMUSCULAR | Status: AC
  Filled 2018-08-21: qty 2

## 2018-08-21 MED ORDER — GEMCITABINE CHEMO FOR BLADDER INSTILLATION 2000 MG
2000.0000 mg | Freq: Once | INTRAVENOUS | Status: AC
  Administered 2018-08-21: 2000 mg via INTRAVESICAL
  Filled 2018-08-21: qty 52.6

## 2018-08-21 MED ORDER — FENTANYL CITRATE (PF) 100 MCG/2ML IJ SOLN
INTRAMUSCULAR | Status: AC
  Administered 2018-08-21: 25 ug via INTRAVENOUS
  Filled 2018-08-21: qty 2

## 2018-08-21 MED ORDER — DIPHENHYDRAMINE HCL 50 MG/ML IJ SOLN
INTRAMUSCULAR | Status: AC
  Filled 2018-08-21: qty 1

## 2018-08-21 MED ORDER — DIPHENHYDRAMINE HCL 50 MG/ML IJ SOLN
INTRAMUSCULAR | Status: DC | PRN
  Administered 2018-08-21: 12.5 mg via INTRAVENOUS

## 2018-08-21 MED ORDER — HYDROCODONE-ACETAMINOPHEN 5-325 MG PO TABS: 1.0000 | ORAL_TABLET | ORAL | 0 refills | Status: AC | PRN

## 2018-08-21 MED ORDER — LIDOCAINE 2% (20 MG/ML) 5 ML SYRINGE
INTRAMUSCULAR | Status: DC | PRN
  Administered 2018-08-21: 100 mg via INTRAVENOUS

## 2018-08-21 MED ORDER — LACTATED RINGERS IV SOLN
INTRAVENOUS | Status: DC
  Administered 2018-08-21: 08:00:00 via INTRAVENOUS

## 2018-08-21 MED ORDER — FENTANYL CITRATE (PF) 100 MCG/2ML IJ SOLN
25.0000 ug | INTRAMUSCULAR | Status: DC | PRN
  Administered 2018-08-21 (×2): 25 ug via INTRAVENOUS
  Administered 2018-08-21: 50 ug via INTRAVENOUS

## 2018-08-21 MED ORDER — ONDANSETRON HCL 4 MG/2ML IJ SOLN
INTRAMUSCULAR | Status: DC | PRN
  Administered 2018-08-21: 4 mg via INTRAVENOUS

## 2018-08-21 MED ORDER — EPHEDRINE 5 MG/ML INJ
INTRAVENOUS | Status: AC
  Filled 2018-08-21: qty 10

## 2018-08-21 MED ORDER — LIDOCAINE 2% (20 MG/ML) 5 ML SYRINGE
INTRAMUSCULAR | Status: AC
  Filled 2018-08-21: qty 5

## 2018-08-21 MED ORDER — PROPOFOL 10 MG/ML IV BOLUS
INTRAVENOUS | Status: AC
  Filled 2018-08-21: qty 20

## 2018-08-21 MED ORDER — FENTANYL CITRATE (PF) 100 MCG/2ML IJ SOLN
INTRAMUSCULAR | Status: DC | PRN
  Administered 2018-08-21: 50 ug via INTRAVENOUS

## 2018-08-21 MED ORDER — PROPOFOL 10 MG/ML IV BOLUS
INTRAVENOUS | Status: DC | PRN
  Administered 2018-08-21: 200 mg via INTRAVENOUS

## 2018-08-21 MED ORDER — DEXAMETHASONE SODIUM PHOSPHATE 10 MG/ML IJ SOLN
INTRAMUSCULAR | Status: DC | PRN
  Administered 2018-08-21: 10 mg via INTRAVENOUS

## 2018-08-21 MED ORDER — PROMETHAZINE HCL 25 MG/ML IJ SOLN: 6.2500 mg | INTRAMUSCULAR | Status: DC | PRN

## 2018-08-21 MED ORDER — ONDANSETRON HCL 4 MG/2ML IJ SOLN
INTRAMUSCULAR | Status: AC
  Filled 2018-08-21: qty 2

## 2018-08-21 SURGICAL SUPPLY — 16 items
BAG URINE DRAINAGE (UROLOGICAL SUPPLIES) ×3 IMPLANT
BAG URO CATCHER STRL LF (MISCELLANEOUS) ×3 IMPLANT
CATH SILICON 18FR 30CC (CATHETERS) ×3 IMPLANT
COVER WAND RF STERILE (DRAPES) IMPLANT
ELECT REM PT RETURN 15FT ADLT (MISCELLANEOUS) ×3 IMPLANT
GLOVE BIO SURGEON STRL SZ7.5 (GLOVE) ×3 IMPLANT
GOWN STRL REUS W/TWL LRG LVL3 (GOWN DISPOSABLE) ×3 IMPLANT
LOOP CUT BIPOLAR 24F LRG (ELECTROSURGICAL) IMPLANT
MANIFOLD NEPTUNE II (INSTRUMENTS) ×3 IMPLANT
PACK CYSTO (CUSTOM PROCEDURE TRAY) ×3 IMPLANT
PLUG CATH AND CAP STER (CATHETERS) ×3 IMPLANT
SET ASPIRATION TUBING (TUBING) IMPLANT
SYRINGE IRR TOOMEY STRL 70CC (SYRINGE) IMPLANT
TUBING CONNECTING 10 (TUBING) ×2 IMPLANT
TUBING CONNECTING 10' (TUBING) ×1
TUBING UROLOGY SET (TUBING) ×3 IMPLANT

## 2018-08-21 NOTE — Anesthesia Procedure Notes (Signed)
Procedure Name: LMA Insertion Date/Time: 08/21/2018 8:30 AM Performed by: Lind Covert, CRNA Pre-anesthesia Checklist: Patient identified, Emergency Drugs available, Suction available, Patient being monitored and Timeout performed Patient Re-evaluated:Patient Re-evaluated prior to induction Oxygen Delivery Method: Circle system utilized Preoxygenation: Pre-oxygenation with 100% oxygen Induction Type: IV induction Ventilation: Mask ventilation without difficulty LMA: LMA inserted LMA Size: 5.0 Number of attempts: 1 Placement Confirmation: positive ETCO2 and breath sounds checked- equal and bilateral Tube secured with: Tape Dental Injury: Teeth and Oropharynx as per pre-operative assessment

## 2018-08-21 NOTE — Transfer of Care (Signed)
Immediate Anesthesia Transfer of Care Note  Patient: Richard Austin  Procedure(s) Performed: TRANSURETHRAL RESECTION OF BLADDER TUMOR WITH GEMCITABINE (N/A Bladder)  Patient Location: PACU  Anesthesia Type:General  Level of Consciousness: sedated  Airway & Oxygen Therapy: Patient Spontanous Breathing and Patient connected to face mask oxygen  Post-op Assessment: Report given to RN and Post -op Vital signs reviewed and stable  Post vital signs: Reviewed and stable  Last Vitals:  Vitals Value Taken Time  BP    Temp    Pulse    Resp    SpO2      Last Pain:  Vitals:   08/21/18 0730  TempSrc:   PainSc: 0-No pain         Complications: No apparent anesthesia complications

## 2018-08-21 NOTE — Discharge Instructions (Addendum)

## 2018-08-21 NOTE — Op Note (Signed)
Operative Note  Preoperative diagnosis:  1.  Bladder tumor  Postoperative diagnosis: 1.  Bladder tumor  Procedure(s): 1.  Transurethral resection of bladder tumor--medium 2.  Intravesical instillation of chemotherapy--gemcitabine  Surgeon: Link Snuffer, MD  Assistants: None  Anesthesia: General  Complications: None immediate  EBL: Minimal  Specimens: 1.  Bladder tumor  Drains/Catheters: 1.  18 French Foley catheter  Intraoperative findings: 1.  Normal urethra.  Prostate was mildly obstructing 2.  Solitary 3 cm bladder tumor papillary in nature on the right posterior lateral wall overlying the right ureteral orifice.  I was unable to locate the right ureteral orifice but did see some efflux of urine at the conclusion of the case in the expected location of it.  Entire tumor appeared to be resected.  Left ureteral orifice was completely free of tumor.  I did have to resect over the right ureteral orifice.  No other bladder tumors  Indication: 72 year old male with a bladder tumor presents for the previously mentioned operation.  Upper tract imaging negative for tumor  Description of procedure:  The patient was identified and consent was obtained.  The patient was taken to the operating room and placed in the supine position.  The patient was placed under general anesthesia.  Perioperative antibiotics were administered.  The patient was placed in dorsal lithotomy.  Patient was prepped and draped in a standard sterile fashion and a timeout was performed.  45 French resectoscope with a visual obturator in place was advanced into the urethra and into the bladder.  Visual obturator was exchanged for the working element.  Complete cystoscopy was performed which identified 1 lesion of interest.  This was resected on bipolar settings down to muscle fibers.  I did have to resect over the right ureteral orifice.  I tried to use careful fulguration around the area of the right ureteral orifice  but fulgurated the remainder of the tumor bed completely.  Also fulgurated the surrounding tissue.  The tumor appeared to be completely resected.  I evacuated all the bladder tumor chips and sent it off for permanent specimen.  I reinspected the bladder mucosa and there were no areas of active bleeding.  No other bladder tumors.  I therefore removed the scope and placed a 18 French Foley catheter.  The patient tolerated the procedure well and was stable postoperatively.  In the PACU, gemcitabine was instilled into the bladder and remain for approximately 2 hours at which point it was discarded of appropriately.  Plan: Follow-up in 1 week for pathology review.

## 2018-08-21 NOTE — Anesthesia Postprocedure Evaluation (Signed)
Anesthesia Post Note  Patient: Richard Austin  Procedure(s) Performed: TRANSURETHRAL RESECTION OF BLADDER TUMOR WITH GEMCITABINE (N/A Bladder)     Patient location during evaluation: PACU Anesthesia Type: General Level of consciousness: sedated Pain management: pain level controlled Vital Signs Assessment: post-procedure vital signs reviewed and stable Respiratory status: spontaneous breathing and respiratory function stable Cardiovascular status: stable Postop Assessment: no apparent nausea or vomiting Anesthetic complications: no    Last Vitals:  Vitals:   08/21/18 0945 08/21/18 1000  BP: 134/70 138/80  Pulse: 65 66  Resp: 19 14  Temp:    SpO2: 100% 98%                 Angelys Yetman DANIEL

## 2018-08-21 NOTE — H&P (Signed)
CC/HPI: Cc: Bladder tumor  HPI:  07/25/2018:  72 year old male underwent a CT scan of the abdomen and pelvis without contrast. This revealed a 3.5 cm calcified bladder tumor. There was no hydronephrosis. He also had a small bowel obstruction at the time. He denies any hematuria or dysuria. Urinalysis is negative today. He also complains of erectile dysfunction. He has taken Viagra in the past which worked well for him. Cialis does not work as well for him. He does not take nitrates for chest pain.     ALLERGIES: Amlodipine Bystolic Statins     MEDICATIONS: Flomax     GU PSH: None     PSH Notes: Correct Peyronie's    NON-GU PSH: Bowel Surgery Procedure Hernia Repair    GU PMH: None   NON-GU PMH: Arthritis GERD Hypercholesterolemia Hypertension    FAMILY HISTORY: 1 Daughter - Other 1 son - Other Bladder Cancer - Father   SOCIAL HISTORY: Marital Status: Married Preferred Language: English Current Smoking Status: Patient does not smoke anymore. Has not smoked since 07/13/2003.   Tobacco Use Assessment Completed: Used Tobacco in last 30 days? Drinks 2 caffeinated drinks per day.    REVIEW OF SYSTEMS:    GU Review Male:   Patient reports get up at night to urinate and erection problems. Patient denies frequent urination, hard to postpone urination, burning/ pain with urination, leakage of urine, stream starts and stops, trouble starting your stream, have to strain to urinate , and penile pain.  Gastrointestinal (Upper):   Patient reports indigestion/ heartburn. Patient denies nausea and vomiting.  Gastrointestinal (Lower):   Patient denies diarrhea and constipation.  Constitutional:   Patient reports night sweats and fatigue. Patient denies fever and weight loss.  Skin:   Patient reports skin rash/ lesion and itching.   Eyes:   Patient denies double vision and blurred vision.  Ears/ Nose/ Throat:   Patient denies sore throat and sinus problems.  Hematologic/Lymphatic:    Patient reports easy bruising. Patient denies swollen glands.  Cardiovascular:   Patient denies leg swelling and chest pains.  Respiratory:   Patient denies cough and shortness of breath.  Endocrine:   Patient denies excessive thirst.  Musculoskeletal:   Patient reports back pain and joint pain.   Neurological:   Patient denies headaches and dizziness.  Psychologic:   Patient denies depression and anxiety.   Notes: weak stream    VITAL SIGNS:      07/25/2018 12:50 PM  Weight 184 lb / 83.46 kg  Height 68 in / 172.72 cm  BP 192/95 mmHg  Heart Rate 77 /min  BMI 28.0 kg/m   GU PHYSICAL EXAMINATION:    Anus and Perineum: No hemorrhoids. No anal stenosis. No rectal fissure, no anal fissure. No edema, no dimple, no perineal tenderness, no anal tenderness.  Scrotum: No lesions. No edema. No cysts. No warts.  Epididymides: Right: no spermatocele, no masses, no cysts, no tenderness, no induration, no enlargement. Left: no spermatocele, no masses, no cysts, no tenderness, no induration, no enlargement.  Testes: No tenderness, no swelling, no enlargement left testes. No tenderness, no swelling, no enlargement right testes. Normal location left testes. Normal location right testes. No mass, no cyst, no varicocele, no hydrocele left testes. No mass, no cyst, no varicocele, no hydrocele right testes.  Urethral Meatus: Normal size. No lesion, no wart, no discharge, no polyp. Normal location.  Penis: Circumcised, no warts, no cracks. No dorsal Peyronie's plaques, no left corporal Peyronie's plaques, no right corporal  Peyronie's plaques, no scarring, no warts. No balanitis, no meatal stenosis.  Prostate: Prostate about 40 grams. Left lobe normal consistency, right lobe normal consistency. Symmetrical lobes. No prostate nodule. Left lobe no tenderness, right lobe no tenderness.   Seminal Vesicles: Nonpalpable.  Sphincter Tone: Normal sphincter. No rectal tenderness. No rectal mass.    MULTI-SYSTEM  PHYSICAL EXAMINATION:    Constitutional: Well-nourished. No physical deformities. Normally developed. Good grooming.  Respiratory: No labored breathing, no use of accessory muscles.   Cardiovascular: Normal temperature, adequate perfusion of extremities  Skin: No paleness, no jaundice  Neurologic / Psychiatric: Oriented to time, oriented to place, oriented to person. No depression, no anxiety, no agitation.  Gastrointestinal: No mass, no tenderness, no rigidity, non obese abdomen.  Eyes: Normal conjunctivae. Normal eyelids.  Musculoskeletal: Normal gait and station of head and neck.     PAST DATA REVIEWED:  Source Of History:  Patient  Records Review:   Previous Patient Records  X-Ray Review: C.T. Abdomen/Pelvis: Reviewed Films. Reviewed Report. Discussed With Patient.     PROCEDURES:         Flexible Cystoscopy - 52000  Risks, benefits, and some of the potential complications of the procedure were discussed at length with the patient including infection, bleeding, voiding discomfort, urinary retention, fever, chills, sepsis, and others. All questions were answered. Informed consent was obtained. Antibiotic prophylaxis was given. Sterile technique and intraurethral analgesia were used.  Meatus:  Normal size. Normal location. Normal condition.  Urethra:  No strictures.  External Sphincter:  Normal.  Verumontanum:  Normal.  Prostate:  Obstructing. Moderate hyperplasia.  Bladder Neck:  Non-obstructing.  Ureteral Orifices:  There was overlying the right ureteral orifice. Potentially obstructing this a small amount.tumor left ureteral orifice was normal  Bladder:  3 cm bladder tumor papillary in appearance with overlying calcifications on the right posterior lateral wall close to the right ureteral orifice, potentially obstructing it.      The lower urinary tract was carefully examined. The procedure was well-tolerated and without complications. Antibiotic instructions were given.  Instructions were given to call the office immediately for bloody urine, difficulty urinating, urinary retention, painful or frequent urination, fever, chills, nausea, vomiting or other illness. The patient stated that he understood these instructions and would comply with them.         Urinalysis Dipstick Dipstick Cont'd  Color: Yellow Bilirubin: Neg mg/dL  Appearance: Clear Ketones: Neg mg/dL  Specific Gravity: 1.010 Blood: Neg ery/uL  pH: 6.0 Protein: Neg mg/dL  Glucose: Neg mg/dL Urobilinogen: 0.2 mg/dL    Nitrites: Neg    Leukocyte Esterase: Neg leu/uL    ASSESSMENT:      ICD-10 Details  1 GU:   Bladder tumor/neoplasm - D41.4   2   ED due to arterial insufficiency - N52.01    PLAN:            Medications New Meds: Viagra 100 mg tablet 1 tablet PO Daily As Directed As needed for erectile dysfunction.  #5  3 Refill(s)            Orders Labs BUN/Creatinine          Schedule X-Rays: 1 Week - C.T. Hematuria With and Without I.V. Contrast  Return Visit/Planned Activity: 1 Week - C.T. Hematuria  Return Visit/Planned Activity: Next Available Appointment - Schedule Surgery          Document Letter(s):  Created for Patient: Clinical Summary         Notes:   Obtain  CT IVP to rule out any ureteral lesions.   Scheduled for TURBT with instillation of gemcitabine. He understands potential risks including but not limited to bleeding, infection, injury to surrounding structures, potential bladder perforation, need for additional procedures.   He would also like to try Viagra. I sent a prescription for that to his pharmacy. He does not take nitrates for chest pain. He has tolerated PDE 5 inhibitors in the past        Next Appointment:      Next Appointment: 07/30/2018 08:45 AM    Appointment Type: CT Scan    Location: [facility] [phone]    Provider: CT CT    Reason for Visit: Ct hematuria w/wo contrast / Gloriann Loan / bladder tumor    Signed by Link Snuffer,  III, M.D. on 07/25/18 at 10:38 PM (EST

## 2018-08-22 ENCOUNTER — Encounter (HOSPITAL_COMMUNITY): Payer: Self-pay | Admitting: Urology

## 2018-09-18 ENCOUNTER — Other Ambulatory Visit: Payer: Self-pay | Admitting: Ophthalmology

## 2018-09-18 DIAGNOSIS — N3020 Other chronic cystitis without hematuria: Secondary | ICD-10-CM | POA: Diagnosis not present

## 2018-09-18 DIAGNOSIS — N3080 Other cystitis without hematuria: Secondary | ICD-10-CM | POA: Diagnosis not present

## 2018-09-18 DIAGNOSIS — N3000 Acute cystitis without hematuria: Secondary | ICD-10-CM | POA: Diagnosis not present

## 2018-09-18 DIAGNOSIS — C678 Malignant neoplasm of overlapping sites of bladder: Secondary | ICD-10-CM | POA: Diagnosis not present

## 2018-09-23 ENCOUNTER — Telehealth: Payer: Self-pay | Admitting: *Deleted

## 2018-09-23 NOTE — Telephone Encounter (Signed)
Copied from [place] [id number]. Topic: General - Inquiry >> Sep 23, 2018  3:01 PM Rutherford Nail, [place] wrote: Reason for CRM: Patient calling and would like to become a patient of Dr Maudie Mercury. Advised that she is currently not accepting new patients unless referred by current patients. Denies being referred by current patient. States that he just moved to the area. States that he has to have a DO, that MDs cannot treat him. States that sometimes he has to get injections in his shoulders and MDs cannot give them. Was not referred by a current patient. Covered under [place] [employer].  CB#: [phone] or [phone]

## 2018-09-24 NOTE — Telephone Encounter (Signed)
Returned call to patient- he stated he would continue to look for a DO that also did joint injections.  He appreciated the prompt response and would call back if needed to.

## 2018-09-24 NOTE — Telephone Encounter (Signed)
Please let him know I am quite full.  I do not usually do joint injections.  Injections are not specific to date those.  Some of our other MDs actually do them.  Would recommend that he establish with 1 of our providers taking new patients.  If they do not do joint injections and he requires 1, we have excellent sports medicine physicians within our practice that are very capable and they also are DO's and MDs.  Some of the sports medicine doctors  also do osteopathic treatments if needed.

## 2018-09-26 DIAGNOSIS — C674 Malignant neoplasm of posterior wall of bladder: Secondary | ICD-10-CM | POA: Diagnosis not present

## 2018-10-14 DIAGNOSIS — C674 Malignant neoplasm of posterior wall of bladder: Secondary | ICD-10-CM | POA: Diagnosis not present

## 2018-10-14 DIAGNOSIS — Z5111 Encounter for antineoplastic chemotherapy: Secondary | ICD-10-CM | POA: Diagnosis not present

## 2018-10-14 DIAGNOSIS — R8271 Bacteriuria: Secondary | ICD-10-CM | POA: Diagnosis not present

## 2018-10-28 DIAGNOSIS — Z5111 Encounter for antineoplastic chemotherapy: Secondary | ICD-10-CM | POA: Diagnosis not present

## 2018-10-28 DIAGNOSIS — C674 Malignant neoplasm of posterior wall of bladder: Secondary | ICD-10-CM | POA: Diagnosis not present

## 2018-10-30 ENCOUNTER — Telehealth: Payer: Self-pay | Admitting: Family Medicine

## 2018-10-30 NOTE — Telephone Encounter (Signed)
LVM for the patient call back if he would like to schedule appt with dr Juleen China.

## 2018-10-30 NOTE — Telephone Encounter (Signed)
Contact patient and advise that Dr. Juleen China refers to Dr. Paulla Fore typically for joint injections who is also a DO but he is only specialty.

## 2018-10-30 NOTE — Telephone Encounter (Signed)
Pt called to thank office for the call back. He declined to schedule with Dr. Paulla Fore at this time as he is looking for a DO, non-specialist, that will do the injections.

## 2018-10-30 NOTE — Telephone Encounter (Signed)
Copied from [place] [phone]. Topic: General - Inquiry >> Oct 30, 2018 11:07 AM Sheran Luz wrote: Reason for CRM:  Patient states that he is currently looking for DO, advised him that Dr. Juleen China is accepting new patients. Patient would like to know, before scheduling NP appointment, if Dr. Juleen China preforms joint injections. Please advise.

## 2018-11-04 DIAGNOSIS — Z5111 Encounter for antineoplastic chemotherapy: Secondary | ICD-10-CM | POA: Diagnosis not present

## 2018-11-04 DIAGNOSIS — C674 Malignant neoplasm of posterior wall of bladder: Secondary | ICD-10-CM | POA: Diagnosis not present

## 2018-11-11 DIAGNOSIS — C674 Malignant neoplasm of posterior wall of bladder: Secondary | ICD-10-CM | POA: Diagnosis not present

## 2018-11-11 DIAGNOSIS — Z5111 Encounter for antineoplastic chemotherapy: Secondary | ICD-10-CM | POA: Diagnosis not present

## 2018-11-13 DIAGNOSIS — N400 Enlarged prostate without lower urinary tract symptoms: Secondary | ICD-10-CM | POA: Diagnosis not present

## 2018-11-13 DIAGNOSIS — I10 Essential (primary) hypertension: Secondary | ICD-10-CM | POA: Diagnosis not present

## 2018-11-13 DIAGNOSIS — K219 Gastro-esophageal reflux disease without esophagitis: Secondary | ICD-10-CM | POA: Diagnosis not present

## 2018-11-13 DIAGNOSIS — J309 Allergic rhinitis, unspecified: Secondary | ICD-10-CM | POA: Diagnosis not present

## 2018-11-18 DIAGNOSIS — Z5111 Encounter for antineoplastic chemotherapy: Secondary | ICD-10-CM | POA: Diagnosis not present

## 2018-11-18 DIAGNOSIS — C674 Malignant neoplasm of posterior wall of bladder: Secondary | ICD-10-CM | POA: Diagnosis not present

## 2018-11-25 DIAGNOSIS — Z5111 Encounter for antineoplastic chemotherapy: Secondary | ICD-10-CM | POA: Diagnosis not present

## 2018-11-25 DIAGNOSIS — C674 Malignant neoplasm of posterior wall of bladder: Secondary | ICD-10-CM | POA: Diagnosis not present

## 2018-11-27 DIAGNOSIS — I10 Essential (primary) hypertension: Secondary | ICD-10-CM | POA: Diagnosis not present

## 2018-11-27 DIAGNOSIS — J309 Allergic rhinitis, unspecified: Secondary | ICD-10-CM | POA: Diagnosis not present

## 2018-11-27 DIAGNOSIS — C679 Malignant neoplasm of bladder, unspecified: Secondary | ICD-10-CM | POA: Diagnosis not present

## 2018-11-27 DIAGNOSIS — M1990 Unspecified osteoarthritis, unspecified site: Secondary | ICD-10-CM | POA: Diagnosis not present

## 2018-11-28 DIAGNOSIS — I10 Essential (primary) hypertension: Secondary | ICD-10-CM | POA: Diagnosis not present

## 2018-11-28 DIAGNOSIS — R7309 Other abnormal glucose: Secondary | ICD-10-CM | POA: Diagnosis not present

## 2018-11-28 DIAGNOSIS — E7800 Pure hypercholesterolemia, unspecified: Secondary | ICD-10-CM | POA: Diagnosis not present

## 2018-12-04 DIAGNOSIS — M1990 Unspecified osteoarthritis, unspecified site: Secondary | ICD-10-CM | POA: Diagnosis not present

## 2018-12-04 DIAGNOSIS — E785 Hyperlipidemia, unspecified: Secondary | ICD-10-CM | POA: Diagnosis not present

## 2018-12-04 DIAGNOSIS — I10 Essential (primary) hypertension: Secondary | ICD-10-CM | POA: Diagnosis not present

## 2018-12-04 DIAGNOSIS — J309 Allergic rhinitis, unspecified: Secondary | ICD-10-CM | POA: Diagnosis not present

## 2019-01-10 DIAGNOSIS — M1990 Unspecified osteoarthritis, unspecified site: Secondary | ICD-10-CM | POA: Diagnosis not present

## 2019-02-12 DIAGNOSIS — Z0120 Encounter for dental examination and cleaning without abnormal findings: Secondary | ICD-10-CM | POA: Diagnosis not present

## 2019-02-20 DIAGNOSIS — C674 Malignant neoplasm of posterior wall of bladder: Secondary | ICD-10-CM | POA: Diagnosis not present

## 2019-03-06 DIAGNOSIS — E785 Hyperlipidemia, unspecified: Secondary | ICD-10-CM | POA: Diagnosis not present

## 2019-03-06 DIAGNOSIS — R7303 Prediabetes: Secondary | ICD-10-CM | POA: Diagnosis not present

## 2019-03-06 DIAGNOSIS — I10 Essential (primary) hypertension: Secondary | ICD-10-CM | POA: Diagnosis not present

## 2019-03-10 DIAGNOSIS — E785 Hyperlipidemia, unspecified: Secondary | ICD-10-CM | POA: Diagnosis not present

## 2019-03-10 DIAGNOSIS — I10 Essential (primary) hypertension: Secondary | ICD-10-CM | POA: Diagnosis not present

## 2019-03-10 DIAGNOSIS — Z0000 Encounter for general adult medical examination without abnormal findings: Secondary | ICD-10-CM | POA: Diagnosis not present

## 2019-03-10 DIAGNOSIS — E119 Type 2 diabetes mellitus without complications: Secondary | ICD-10-CM | POA: Diagnosis not present

## 2019-05-23 DIAGNOSIS — C674 Malignant neoplasm of posterior wall of bladder: Secondary | ICD-10-CM | POA: Diagnosis not present

## 2019-06-03 DIAGNOSIS — E119 Type 2 diabetes mellitus without complications: Secondary | ICD-10-CM | POA: Diagnosis not present

## 2019-06-03 DIAGNOSIS — I10 Essential (primary) hypertension: Secondary | ICD-10-CM | POA: Diagnosis not present

## 2019-06-03 DIAGNOSIS — E785 Hyperlipidemia, unspecified: Secondary | ICD-10-CM | POA: Diagnosis not present

## 2019-06-10 DIAGNOSIS — M1990 Unspecified osteoarthritis, unspecified site: Secondary | ICD-10-CM | POA: Diagnosis not present

## 2019-06-10 DIAGNOSIS — E785 Hyperlipidemia, unspecified: Secondary | ICD-10-CM | POA: Diagnosis not present

## 2019-06-10 DIAGNOSIS — I10 Essential (primary) hypertension: Secondary | ICD-10-CM | POA: Diagnosis not present

## 2019-06-10 DIAGNOSIS — E119 Type 2 diabetes mellitus without complications: Secondary | ICD-10-CM | POA: Diagnosis not present

## 2019-07-31 DIAGNOSIS — M1990 Unspecified osteoarthritis, unspecified site: Secondary | ICD-10-CM | POA: Diagnosis not present

## 2019-08-12 DIAGNOSIS — C674 Malignant neoplasm of posterior wall of bladder: Secondary | ICD-10-CM | POA: Diagnosis not present

## 2019-08-12 DIAGNOSIS — C679 Malignant neoplasm of bladder, unspecified: Secondary | ICD-10-CM | POA: Diagnosis not present

## 2019-08-15 DIAGNOSIS — C674 Malignant neoplasm of posterior wall of bladder: Secondary | ICD-10-CM | POA: Diagnosis not present

## 2019-08-26 DIAGNOSIS — L910 Hypertrophic scar: Secondary | ICD-10-CM | POA: Diagnosis not present

## 2019-08-26 DIAGNOSIS — T8189XA Other complications of procedures, not elsewhere classified, initial encounter: Secondary | ICD-10-CM | POA: Diagnosis not present

## 2019-08-26 DIAGNOSIS — R222 Localized swelling, mass and lump, trunk: Secondary | ICD-10-CM | POA: Diagnosis not present

## 2019-10-06 DIAGNOSIS — K439 Ventral hernia without obstruction or gangrene: Secondary | ICD-10-CM | POA: Diagnosis not present

## 2019-10-06 DIAGNOSIS — K8020 Calculus of gallbladder without cholecystitis without obstruction: Secondary | ICD-10-CM | POA: Diagnosis not present

## 2019-10-06 DIAGNOSIS — K4020 Bilateral inguinal hernia, without obstruction or gangrene, not specified as recurrent: Secondary | ICD-10-CM | POA: Diagnosis not present

## 2019-10-14 DIAGNOSIS — T8132XA Disruption of internal operation (surgical) wound, not elsewhere classified, initial encounter: Secondary | ICD-10-CM | POA: Diagnosis not present

## 2019-10-14 DIAGNOSIS — R1032 Left lower quadrant pain: Secondary | ICD-10-CM | POA: Diagnosis not present

## 2019-10-17 DIAGNOSIS — Z03818 Encounter for observation for suspected exposure to other biological agents ruled out: Secondary | ICD-10-CM | POA: Diagnosis not present

## 2019-10-17 DIAGNOSIS — Z20828 Contact with and (suspected) exposure to other viral communicable diseases: Secondary | ICD-10-CM | POA: Diagnosis not present

## 2019-10-17 DIAGNOSIS — Z01818 Encounter for other preprocedural examination: Secondary | ICD-10-CM | POA: Diagnosis not present

## 2019-10-17 DIAGNOSIS — Z1159 Encounter for screening for other viral diseases: Secondary | ICD-10-CM | POA: Diagnosis not present

## 2019-10-22 DIAGNOSIS — L728 Other follicular cysts of the skin and subcutaneous tissue: Secondary | ICD-10-CM | POA: Diagnosis not present

## 2019-10-22 DIAGNOSIS — L986 Other infiltrative disorders of the skin and subcutaneous tissue: Secondary | ICD-10-CM | POA: Diagnosis not present

## 2019-10-30 DIAGNOSIS — N200 Calculus of kidney: Secondary | ICD-10-CM | POA: Diagnosis not present

## 2019-10-30 DIAGNOSIS — N281 Cyst of kidney, acquired: Secondary | ICD-10-CM | POA: Diagnosis not present

## 2019-10-30 DIAGNOSIS — R1084 Generalized abdominal pain: Secondary | ICD-10-CM | POA: Diagnosis not present

## 2019-10-30 DIAGNOSIS — K8020 Calculus of gallbladder without cholecystitis without obstruction: Secondary | ICD-10-CM | POA: Diagnosis not present

## 2019-11-14 DIAGNOSIS — C674 Malignant neoplasm of posterior wall of bladder: Secondary | ICD-10-CM | POA: Diagnosis not present

## 2019-12-01 DIAGNOSIS — I10 Essential (primary) hypertension: Secondary | ICD-10-CM | POA: Diagnosis not present

## 2019-12-01 DIAGNOSIS — E119 Type 2 diabetes mellitus without complications: Secondary | ICD-10-CM | POA: Diagnosis not present

## 2019-12-01 DIAGNOSIS — E785 Hyperlipidemia, unspecified: Secondary | ICD-10-CM | POA: Diagnosis not present

## 2019-12-08 DIAGNOSIS — E119 Type 2 diabetes mellitus without complications: Secondary | ICD-10-CM | POA: Diagnosis not present

## 2019-12-08 DIAGNOSIS — N400 Enlarged prostate without lower urinary tract symptoms: Secondary | ICD-10-CM | POA: Diagnosis not present

## 2019-12-08 DIAGNOSIS — E785 Hyperlipidemia, unspecified: Secondary | ICD-10-CM | POA: Diagnosis not present

## 2019-12-08 DIAGNOSIS — I10 Essential (primary) hypertension: Secondary | ICD-10-CM | POA: Diagnosis not present

## 2020-01-05 DIAGNOSIS — Z0120 Encounter for dental examination and cleaning without abnormal findings: Secondary | ICD-10-CM | POA: Diagnosis not present

## 2020-01-12 DIAGNOSIS — M1990 Unspecified osteoarthritis, unspecified site: Secondary | ICD-10-CM | POA: Diagnosis not present

## 2020-03-02 DIAGNOSIS — K5730 Diverticulosis of large intestine without perforation or abscess without bleeding: Secondary | ICD-10-CM | POA: Insufficient documentation

## 2020-03-02 DIAGNOSIS — N179 Acute kidney failure, unspecified: Secondary | ICD-10-CM | POA: Diagnosis not present

## 2020-03-02 DIAGNOSIS — N400 Enlarged prostate without lower urinary tract symptoms: Secondary | ICD-10-CM | POA: Insufficient documentation

## 2020-03-02 DIAGNOSIS — I10 Essential (primary) hypertension: Secondary | ICD-10-CM | POA: Diagnosis not present

## 2020-03-02 DIAGNOSIS — R1032 Left lower quadrant pain: Secondary | ICD-10-CM | POA: Diagnosis not present

## 2020-03-02 DIAGNOSIS — K56609 Unspecified intestinal obstruction, unspecified as to partial versus complete obstruction: Secondary | ICD-10-CM | POA: Diagnosis not present

## 2020-03-02 DIAGNOSIS — R109 Unspecified abdominal pain: Secondary | ICD-10-CM | POA: Diagnosis not present

## 2020-03-02 DIAGNOSIS — Z933 Colostomy status: Secondary | ICD-10-CM | POA: Diagnosis not present

## 2020-03-02 DIAGNOSIS — M1990 Unspecified osteoarthritis, unspecified site: Secondary | ICD-10-CM | POA: Diagnosis not present

## 2020-03-02 DIAGNOSIS — Z9049 Acquired absence of other specified parts of digestive tract: Secondary | ICD-10-CM | POA: Diagnosis not present

## 2020-03-02 DIAGNOSIS — R1909 Other intra-abdominal and pelvic swelling, mass and lump: Secondary | ICD-10-CM | POA: Diagnosis not present

## 2020-03-02 DIAGNOSIS — K50012 Crohn's disease of small intestine with intestinal obstruction: Secondary | ICD-10-CM | POA: Diagnosis not present

## 2020-03-02 DIAGNOSIS — K4090 Unilateral inguinal hernia, without obstruction or gangrene, not specified as recurrent: Secondary | ICD-10-CM | POA: Diagnosis not present

## 2020-03-02 DIAGNOSIS — Z4682 Encounter for fitting and adjustment of non-vascular catheter: Secondary | ICD-10-CM | POA: Diagnosis not present

## 2020-03-02 DIAGNOSIS — D72829 Elevated white blood cell count, unspecified: Secondary | ICD-10-CM | POA: Diagnosis not present

## 2020-03-02 DIAGNOSIS — Z20822 Contact with and (suspected) exposure to covid-19: Secondary | ICD-10-CM | POA: Diagnosis not present

## 2020-03-02 DIAGNOSIS — N289 Disorder of kidney and ureter, unspecified: Secondary | ICD-10-CM | POA: Diagnosis not present

## 2020-03-18 DIAGNOSIS — N401 Enlarged prostate with lower urinary tract symptoms: Secondary | ICD-10-CM | POA: Diagnosis not present

## 2020-03-18 DIAGNOSIS — R351 Nocturia: Secondary | ICD-10-CM | POA: Diagnosis not present

## 2020-03-18 DIAGNOSIS — C674 Malignant neoplasm of posterior wall of bladder: Secondary | ICD-10-CM | POA: Diagnosis not present

## 2020-03-18 DIAGNOSIS — R3912 Poor urinary stream: Secondary | ICD-10-CM | POA: Diagnosis not present

## 2020-03-26 DIAGNOSIS — M1990 Unspecified osteoarthritis, unspecified site: Secondary | ICD-10-CM | POA: Diagnosis not present

## 2020-04-23 ENCOUNTER — Other Ambulatory Visit: Payer: Self-pay | Admitting: Ophthalmology

## 2020-04-23 DIAGNOSIS — D494 Neoplasm of unspecified behavior of bladder: Secondary | ICD-10-CM | POA: Diagnosis not present

## 2020-04-23 DIAGNOSIS — C676 Malignant neoplasm of ureteric orifice: Secondary | ICD-10-CM | POA: Diagnosis not present

## 2020-04-23 DIAGNOSIS — C679 Malignant neoplasm of bladder, unspecified: Secondary | ICD-10-CM | POA: Diagnosis not present

## 2020-04-30 DIAGNOSIS — D414 Neoplasm of uncertain behavior of bladder: Secondary | ICD-10-CM | POA: Diagnosis not present

## 2020-05-19 DIAGNOSIS — E119 Type 2 diabetes mellitus without complications: Secondary | ICD-10-CM | POA: Diagnosis not present

## 2020-05-19 DIAGNOSIS — I10 Essential (primary) hypertension: Secondary | ICD-10-CM | POA: Diagnosis not present

## 2020-05-19 DIAGNOSIS — Z125 Encounter for screening for malignant neoplasm of prostate: Secondary | ICD-10-CM | POA: Diagnosis not present

## 2020-05-19 DIAGNOSIS — E785 Hyperlipidemia, unspecified: Secondary | ICD-10-CM | POA: Diagnosis not present

## 2020-05-24 DIAGNOSIS — C674 Malignant neoplasm of posterior wall of bladder: Secondary | ICD-10-CM | POA: Diagnosis not present

## 2020-05-24 DIAGNOSIS — Z5111 Encounter for antineoplastic chemotherapy: Secondary | ICD-10-CM | POA: Diagnosis not present

## 2020-05-31 DIAGNOSIS — Z5111 Encounter for antineoplastic chemotherapy: Secondary | ICD-10-CM | POA: Diagnosis not present

## 2020-05-31 DIAGNOSIS — C674 Malignant neoplasm of posterior wall of bladder: Secondary | ICD-10-CM | POA: Diagnosis not present

## 2020-06-03 DIAGNOSIS — H2511 Age-related nuclear cataract, right eye: Secondary | ICD-10-CM | POA: Diagnosis not present

## 2020-06-07 DIAGNOSIS — Z5111 Encounter for antineoplastic chemotherapy: Secondary | ICD-10-CM | POA: Diagnosis not present

## 2020-06-07 DIAGNOSIS — C674 Malignant neoplasm of posterior wall of bladder: Secondary | ICD-10-CM | POA: Diagnosis not present

## 2020-06-09 DIAGNOSIS — Z0000 Encounter for general adult medical examination without abnormal findings: Secondary | ICD-10-CM | POA: Diagnosis not present

## 2020-06-09 DIAGNOSIS — C679 Malignant neoplasm of bladder, unspecified: Secondary | ICD-10-CM | POA: Diagnosis not present

## 2020-06-09 DIAGNOSIS — M1990 Unspecified osteoarthritis, unspecified site: Secondary | ICD-10-CM | POA: Diagnosis not present

## 2020-06-09 DIAGNOSIS — E119 Type 2 diabetes mellitus without complications: Secondary | ICD-10-CM | POA: Diagnosis not present

## 2020-06-14 DIAGNOSIS — C674 Malignant neoplasm of posterior wall of bladder: Secondary | ICD-10-CM | POA: Diagnosis not present

## 2020-06-14 DIAGNOSIS — Z5111 Encounter for antineoplastic chemotherapy: Secondary | ICD-10-CM | POA: Diagnosis not present

## 2020-06-21 DIAGNOSIS — Z5111 Encounter for antineoplastic chemotherapy: Secondary | ICD-10-CM | POA: Diagnosis not present

## 2020-06-21 DIAGNOSIS — C674 Malignant neoplasm of posterior wall of bladder: Secondary | ICD-10-CM | POA: Diagnosis not present

## 2020-06-28 DIAGNOSIS — C674 Malignant neoplasm of posterior wall of bladder: Secondary | ICD-10-CM | POA: Diagnosis not present

## 2020-06-28 DIAGNOSIS — Z5111 Encounter for antineoplastic chemotherapy: Secondary | ICD-10-CM | POA: Diagnosis not present

## 2020-07-15 DIAGNOSIS — M1990 Unspecified osteoarthritis, unspecified site: Secondary | ICD-10-CM | POA: Diagnosis not present

## 2020-08-04 DIAGNOSIS — H524 Presbyopia: Secondary | ICD-10-CM | POA: Diagnosis not present

## 2020-08-11 DIAGNOSIS — N401 Enlarged prostate with lower urinary tract symptoms: Secondary | ICD-10-CM | POA: Diagnosis not present

## 2020-08-11 DIAGNOSIS — C674 Malignant neoplasm of posterior wall of bladder: Secondary | ICD-10-CM | POA: Diagnosis not present

## 2020-08-11 DIAGNOSIS — R351 Nocturia: Secondary | ICD-10-CM | POA: Diagnosis not present

## 2020-08-23 DIAGNOSIS — M1990 Unspecified osteoarthritis, unspecified site: Secondary | ICD-10-CM | POA: Diagnosis not present

## 2020-09-08 DIAGNOSIS — Z20828 Contact with and (suspected) exposure to other viral communicable diseases: Secondary | ICD-10-CM | POA: Diagnosis not present

## 2020-09-11 HISTORY — PX: ABCESS DRAINAGE: SHX399

## 2020-10-15 DIAGNOSIS — M1990 Unspecified osteoarthritis, unspecified site: Secondary | ICD-10-CM | POA: Diagnosis not present

## 2020-11-11 DIAGNOSIS — C674 Malignant neoplasm of posterior wall of bladder: Secondary | ICD-10-CM | POA: Diagnosis not present

## 2020-11-30 DIAGNOSIS — E785 Hyperlipidemia, unspecified: Secondary | ICD-10-CM | POA: Diagnosis not present

## 2020-11-30 DIAGNOSIS — E119 Type 2 diabetes mellitus without complications: Secondary | ICD-10-CM | POA: Diagnosis not present

## 2020-11-30 DIAGNOSIS — I10 Essential (primary) hypertension: Secondary | ICD-10-CM | POA: Diagnosis not present

## 2020-11-30 DIAGNOSIS — R946 Abnormal results of thyroid function studies: Secondary | ICD-10-CM | POA: Diagnosis not present

## 2020-12-01 DIAGNOSIS — E119 Type 2 diabetes mellitus without complications: Secondary | ICD-10-CM | POA: Diagnosis not present

## 2020-12-07 DIAGNOSIS — C679 Malignant neoplasm of bladder, unspecified: Secondary | ICD-10-CM | POA: Diagnosis not present

## 2020-12-07 DIAGNOSIS — N400 Enlarged prostate without lower urinary tract symptoms: Secondary | ICD-10-CM | POA: Diagnosis not present

## 2020-12-07 DIAGNOSIS — K219 Gastro-esophageal reflux disease without esophagitis: Secondary | ICD-10-CM | POA: Diagnosis not present

## 2020-12-07 DIAGNOSIS — E785 Hyperlipidemia, unspecified: Secondary | ICD-10-CM | POA: Diagnosis not present

## 2020-12-07 DIAGNOSIS — M47812 Spondylosis without myelopathy or radiculopathy, cervical region: Secondary | ICD-10-CM | POA: Diagnosis not present

## 2020-12-20 DIAGNOSIS — C674 Malignant neoplasm of posterior wall of bladder: Secondary | ICD-10-CM | POA: Diagnosis not present

## 2021-01-05 DIAGNOSIS — K56609 Unspecified intestinal obstruction, unspecified as to partial versus complete obstruction: Secondary | ICD-10-CM | POA: Diagnosis not present

## 2021-01-05 DIAGNOSIS — K5730 Diverticulosis of large intestine without perforation or abscess without bleeding: Secondary | ICD-10-CM | POA: Diagnosis not present

## 2021-01-05 DIAGNOSIS — Z1211 Encounter for screening for malignant neoplasm of colon: Secondary | ICD-10-CM | POA: Diagnosis not present

## 2021-01-10 DIAGNOSIS — R1084 Generalized abdominal pain: Secondary | ICD-10-CM | POA: Diagnosis not present

## 2021-01-10 DIAGNOSIS — R310 Gross hematuria: Secondary | ICD-10-CM | POA: Diagnosis not present

## 2021-01-10 DIAGNOSIS — C674 Malignant neoplasm of posterior wall of bladder: Secondary | ICD-10-CM | POA: Diagnosis not present

## 2021-01-11 DIAGNOSIS — R310 Gross hematuria: Secondary | ICD-10-CM | POA: Diagnosis not present

## 2021-01-12 DIAGNOSIS — K449 Diaphragmatic hernia without obstruction or gangrene: Secondary | ICD-10-CM | POA: Diagnosis not present

## 2021-01-12 DIAGNOSIS — R1084 Generalized abdominal pain: Secondary | ICD-10-CM | POA: Diagnosis not present

## 2021-01-12 DIAGNOSIS — K439 Ventral hernia without obstruction or gangrene: Secondary | ICD-10-CM | POA: Diagnosis not present

## 2021-01-12 DIAGNOSIS — K429 Umbilical hernia without obstruction or gangrene: Secondary | ICD-10-CM | POA: Diagnosis not present

## 2021-01-12 DIAGNOSIS — R310 Gross hematuria: Secondary | ICD-10-CM | POA: Diagnosis not present

## 2021-01-18 DIAGNOSIS — C674 Malignant neoplasm of posterior wall of bladder: Secondary | ICD-10-CM | POA: Diagnosis not present

## 2021-01-20 DIAGNOSIS — K635 Polyp of colon: Secondary | ICD-10-CM | POA: Diagnosis not present

## 2021-01-20 DIAGNOSIS — K5730 Diverticulosis of large intestine without perforation or abscess without bleeding: Secondary | ICD-10-CM | POA: Diagnosis not present

## 2021-01-20 DIAGNOSIS — D124 Benign neoplasm of descending colon: Secondary | ICD-10-CM | POA: Diagnosis not present

## 2021-01-20 DIAGNOSIS — Z1211 Encounter for screening for malignant neoplasm of colon: Secondary | ICD-10-CM | POA: Diagnosis not present

## 2021-01-24 DIAGNOSIS — M47812 Spondylosis without myelopathy or radiculopathy, cervical region: Secondary | ICD-10-CM | POA: Diagnosis not present

## 2021-01-24 DIAGNOSIS — M4802 Spinal stenosis, cervical region: Secondary | ICD-10-CM | POA: Diagnosis not present

## 2021-01-24 DIAGNOSIS — M4312 Spondylolisthesis, cervical region: Secondary | ICD-10-CM | POA: Diagnosis not present

## 2021-01-24 DIAGNOSIS — M50222 Other cervical disc displacement at C5-C6 level: Secondary | ICD-10-CM | POA: Diagnosis not present

## 2021-01-26 DIAGNOSIS — Z5111 Encounter for antineoplastic chemotherapy: Secondary | ICD-10-CM | POA: Diagnosis not present

## 2021-01-26 DIAGNOSIS — C674 Malignant neoplasm of posterior wall of bladder: Secondary | ICD-10-CM | POA: Diagnosis not present

## 2021-02-02 DIAGNOSIS — Z5111 Encounter for antineoplastic chemotherapy: Secondary | ICD-10-CM | POA: Diagnosis not present

## 2021-02-02 DIAGNOSIS — C674 Malignant neoplasm of posterior wall of bladder: Secondary | ICD-10-CM | POA: Diagnosis not present

## 2021-02-09 DIAGNOSIS — Z5111 Encounter for antineoplastic chemotherapy: Secondary | ICD-10-CM | POA: Diagnosis not present

## 2021-02-09 DIAGNOSIS — C674 Malignant neoplasm of posterior wall of bladder: Secondary | ICD-10-CM | POA: Diagnosis not present

## 2021-03-07 DIAGNOSIS — M542 Cervicalgia: Secondary | ICD-10-CM | POA: Diagnosis not present

## 2021-03-18 DIAGNOSIS — C674 Malignant neoplasm of posterior wall of bladder: Secondary | ICD-10-CM | POA: Diagnosis not present

## 2021-03-18 DIAGNOSIS — R310 Gross hematuria: Secondary | ICD-10-CM | POA: Diagnosis not present

## 2021-04-11 DIAGNOSIS — M1990 Unspecified osteoarthritis, unspecified site: Secondary | ICD-10-CM | POA: Diagnosis not present

## 2021-05-02 DIAGNOSIS — C679 Malignant neoplasm of bladder, unspecified: Secondary | ICD-10-CM | POA: Diagnosis not present

## 2021-05-02 DIAGNOSIS — M5412 Radiculopathy, cervical region: Secondary | ICD-10-CM | POA: Diagnosis not present

## 2021-05-10 DIAGNOSIS — C674 Malignant neoplasm of posterior wall of bladder: Secondary | ICD-10-CM | POA: Diagnosis not present

## 2021-05-10 DIAGNOSIS — Z5111 Encounter for antineoplastic chemotherapy: Secondary | ICD-10-CM | POA: Diagnosis not present

## 2021-05-17 DIAGNOSIS — Z5111 Encounter for antineoplastic chemotherapy: Secondary | ICD-10-CM | POA: Diagnosis not present

## 2021-05-17 DIAGNOSIS — C674 Malignant neoplasm of posterior wall of bladder: Secondary | ICD-10-CM | POA: Diagnosis not present

## 2021-05-22 DIAGNOSIS — M4802 Spinal stenosis, cervical region: Secondary | ICD-10-CM | POA: Insufficient documentation

## 2021-05-24 DIAGNOSIS — C674 Malignant neoplasm of posterior wall of bladder: Secondary | ICD-10-CM | POA: Diagnosis not present

## 2021-05-24 DIAGNOSIS — Z5111 Encounter for antineoplastic chemotherapy: Secondary | ICD-10-CM | POA: Diagnosis not present

## 2021-06-07 DIAGNOSIS — E785 Hyperlipidemia, unspecified: Secondary | ICD-10-CM | POA: Diagnosis not present

## 2021-06-07 DIAGNOSIS — I10 Essential (primary) hypertension: Secondary | ICD-10-CM | POA: Diagnosis not present

## 2021-06-07 DIAGNOSIS — R946 Abnormal results of thyroid function studies: Secondary | ICD-10-CM | POA: Diagnosis not present

## 2021-06-07 DIAGNOSIS — Z125 Encounter for screening for malignant neoplasm of prostate: Secondary | ICD-10-CM | POA: Diagnosis not present

## 2021-06-07 DIAGNOSIS — E119 Type 2 diabetes mellitus without complications: Secondary | ICD-10-CM | POA: Diagnosis not present

## 2021-06-09 DIAGNOSIS — M5412 Radiculopathy, cervical region: Secondary | ICD-10-CM | POA: Diagnosis not present

## 2021-06-14 DIAGNOSIS — Z0000 Encounter for general adult medical examination without abnormal findings: Secondary | ICD-10-CM | POA: Diagnosis not present

## 2021-06-14 DIAGNOSIS — D649 Anemia, unspecified: Secondary | ICD-10-CM | POA: Diagnosis not present

## 2021-06-14 DIAGNOSIS — N1831 Chronic kidney disease, stage 3a: Secondary | ICD-10-CM | POA: Diagnosis not present

## 2021-06-14 DIAGNOSIS — J309 Allergic rhinitis, unspecified: Secondary | ICD-10-CM | POA: Diagnosis not present

## 2021-06-29 ENCOUNTER — Other Ambulatory Visit: Payer: Self-pay

## 2021-06-29 ENCOUNTER — Ambulatory Visit: Payer: Medicare Other | Admitting: Podiatry

## 2021-06-29 DIAGNOSIS — M2011 Hallux valgus (acquired), right foot: Secondary | ICD-10-CM

## 2021-06-29 DIAGNOSIS — R252 Cramp and spasm: Secondary | ICD-10-CM

## 2021-07-08 NOTE — Progress Notes (Signed)
   HPI: 75 y.o. male presenting today as a new patient for evaluation of possible bunion to the right foot.  He states that he noticed that his toe crosses over the second toe.  It is mildly symptomatic with certain shoes.  Patient walks without shoes much of the time. He also has been experiencing some nocturnal foot cramps.  He presents for further treatment and evaluation  Past Medical History:  Diagnosis Date   Arthritis    Bladder tumor    Diverticulitis    Diverticulosis    Hypertension    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 2019   Pre-diabetes      Physical Exam: General: The patient is alert and oriented x3 in no acute distress.  Dermatology: Skin is warm, dry and supple bilateral lower extremities. Negative for open lesions or macerations.  Vascular: Palpable pedal pulses bilaterally. No edema or erythema noted. Capillary refill within normal limits.  Neurological: Epicritic and protective threshold grossly intact bilaterally.   Musculoskeletal Exam: Range of motion within normal limits to all pedal and ankle joints bilateral. Muscle strength 5/5 in all groups bilateral.  There is no clinical evidence of an isolated hallux abductus with lateral deviation of the toe.  There is not a prominent medial eminence of the first metatarsal head.  Assessment: 1.  Hallux valgus right 2.  Nocturnal foot cramps intermittent   Plan of Care:  1. Patient evaluated. 2.  Today we discussed surgical versus conservative treatment options.  The patient is not interested in any surgical intervention at the moment.  Recommend conservative treatment including wide shoes that do not irritate the toe 3.  Recommend that the patient stays hydrated.  He admits to not drinking enough water throughout the day 4.  Return to clinic as needed      Edrick Kins, DPM [facility]  Dr. Edrick Kins, DPM    [address], [place]  [address]                Office [phone]  Fax [phone]

## 2021-07-13 DIAGNOSIS — M5412 Radiculopathy, cervical region: Secondary | ICD-10-CM | POA: Diagnosis not present

## 2021-07-13 DIAGNOSIS — M25549 Pain in joints of unspecified hand: Secondary | ICD-10-CM | POA: Diagnosis not present

## 2021-07-13 DIAGNOSIS — R200 Anesthesia of skin: Secondary | ICD-10-CM | POA: Diagnosis not present

## 2021-07-13 DIAGNOSIS — M542 Cervicalgia: Secondary | ICD-10-CM | POA: Diagnosis not present

## 2021-07-14 DIAGNOSIS — C674 Malignant neoplasm of posterior wall of bladder: Secondary | ICD-10-CM | POA: Diagnosis not present

## 2021-07-16 DIAGNOSIS — G5603 Carpal tunnel syndrome, bilateral upper limbs: Secondary | ICD-10-CM | POA: Diagnosis not present

## 2021-07-18 ENCOUNTER — Encounter (HOSPITAL_COMMUNITY): Payer: Self-pay

## 2021-07-18 ENCOUNTER — Observation Stay (HOSPITAL_COMMUNITY)
Admission: EM | Admit: 2021-07-18 | Discharge: 2021-07-19 | Disposition: A | Discharge status: Home / Self Care | Payer: Medicare Other | Attending: Urology | Admitting: Urology

## 2021-07-18 ENCOUNTER — Emergency Department (HOSPITAL_COMMUNITY): Payer: Medicare Other

## 2021-07-18 ENCOUNTER — Other Ambulatory Visit: Payer: Self-pay

## 2021-07-18 ENCOUNTER — Encounter (HOSPITAL_COMMUNITY)
Admission: EM | Disposition: A | Discharge status: Home / Self Care | Payer: Self-pay | Source: Home / Self Care | Attending: Emergency Medicine

## 2021-07-18 ENCOUNTER — Emergency Department (HOSPITAL_COMMUNITY): Payer: Medicare Other | Admitting: Certified Registered Nurse Anesthetist

## 2021-07-18 ENCOUNTER — Other Ambulatory Visit: Payer: Self-pay | Admitting: Urology

## 2021-07-18 DIAGNOSIS — Z20822 Contact with and (suspected) exposure to covid-19: Secondary | ICD-10-CM | POA: Diagnosis not present

## 2021-07-18 DIAGNOSIS — K449 Diaphragmatic hernia without obstruction or gangrene: Secondary | ICD-10-CM | POA: Diagnosis not present

## 2021-07-18 DIAGNOSIS — L0291 Cutaneous abscess, unspecified: Secondary | ICD-10-CM | POA: Insufficient documentation

## 2021-07-18 DIAGNOSIS — L02214 Cutaneous abscess of groin: Principal | ICD-10-CM

## 2021-07-18 DIAGNOSIS — R7303 Prediabetes: Secondary | ICD-10-CM | POA: Insufficient documentation

## 2021-07-18 DIAGNOSIS — N179 Acute kidney failure, unspecified: Secondary | ICD-10-CM | POA: Diagnosis present

## 2021-07-18 DIAGNOSIS — I10 Essential (primary) hypertension: Secondary | ICD-10-CM | POA: Insufficient documentation

## 2021-07-18 DIAGNOSIS — Z87891 Personal history of nicotine dependence: Secondary | ICD-10-CM | POA: Insufficient documentation

## 2021-07-18 DIAGNOSIS — Z8052 Family history of malignant neoplasm of bladder: Secondary | ICD-10-CM | POA: Diagnosis not present

## 2021-07-18 DIAGNOSIS — Z79899 Other long term (current) drug therapy: Secondary | ICD-10-CM | POA: Insufficient documentation

## 2021-07-18 DIAGNOSIS — R739 Hyperglycemia, unspecified: Secondary | ICD-10-CM | POA: Diagnosis not present

## 2021-07-18 DIAGNOSIS — I700 Atherosclerosis of aorta: Secondary | ICD-10-CM | POA: Diagnosis not present

## 2021-07-18 DIAGNOSIS — Z87438 Personal history of other diseases of male genital organs: Secondary | ICD-10-CM | POA: Insufficient documentation

## 2021-07-18 DIAGNOSIS — L02219 Cutaneous abscess of trunk, unspecified: Secondary | ICD-10-CM

## 2021-07-18 DIAGNOSIS — K4020 Bilateral inguinal hernia, without obstruction or gangrene, not specified as recurrent: Secondary | ICD-10-CM | POA: Diagnosis not present

## 2021-07-18 DIAGNOSIS — Z87448 Personal history of other diseases of urinary system: Secondary | ICD-10-CM | POA: Insufficient documentation

## 2021-07-18 DIAGNOSIS — L02211 Cutaneous abscess of abdominal wall: Secondary | ICD-10-CM | POA: Diagnosis not present

## 2021-07-18 HISTORY — PX: IRRIGATION AND DEBRIDEMENT ABSCESS: SHX5252

## 2021-07-18 LAB — CBC WITH DIFFERENTIAL/PLATELET
Abs Immature Granulocytes: 0.08 10*3/uL — ABNORMAL HIGH (ref 0.00–0.07)
Basophils Absolute: 0 10*3/uL (ref 0.0–0.1)
Basophils Relative: 0 %
Eosinophils Absolute: 0.2 10*3/uL (ref 0.0–0.5)
Eosinophils Relative: 2 %
HCT: 34.2 % — ABNORMAL LOW (ref 39.0–52.0)
Hemoglobin: 11.3 g/dL — ABNORMAL LOW (ref 13.0–17.0)
Immature Granulocytes: 1 %
Lymphocytes Relative: 6 %
Lymphs Abs: 0.8 10*3/uL (ref 0.7–4.0)
MCH: 29.2 pg (ref 26.0–34.0)
MCHC: 33 g/dL (ref 30.0–36.0)
MCV: 88.4 fL (ref 80.0–100.0)
Monocytes Absolute: 0.9 10*3/uL (ref 0.1–1.0)
Monocytes Relative: 7 %
Neutro Abs: 11.3 10*3/uL — ABNORMAL HIGH (ref 1.7–7.7)
Neutrophils Relative %: 84 %
Platelets: 218 10*3/uL (ref 150–400)
RBC: 3.87 MIL/uL — ABNORMAL LOW (ref 4.22–5.81)
RDW: 15.1 % (ref 11.5–15.5)
WBC: 13.4 10*3/uL — ABNORMAL HIGH (ref 4.0–10.5)
nRBC: 0 % (ref 0.0–0.2)

## 2021-07-18 LAB — COMPREHENSIVE METABOLIC PANEL
ALT: 15 U/L (ref 0–44)
AST: 14 U/L — ABNORMAL LOW (ref 15–41)
Albumin: 4 g/dL (ref 3.5–5.0)
Alkaline Phosphatase: 73 U/L (ref 38–126)
Anion gap: 9 (ref 5–15)
BUN: 48 mg/dL — ABNORMAL HIGH (ref 8–23)
CO2: 20 mmol/L — ABNORMAL LOW (ref 22–32)
Calcium: 9 mg/dL (ref 8.9–10.3)
Chloride: 107 mmol/L (ref 98–111)
Creatinine, Ser: 1.6 mg/dL — ABNORMAL HIGH (ref 0.61–1.24)
GFR, Estimated: 45 mL/min — ABNORMAL LOW (ref >60)
Glucose, Bld: 234 mg/dL — ABNORMAL HIGH (ref 70–99)
Potassium: 3.4 mmol/L — ABNORMAL LOW (ref 3.5–5.1)
Sodium: 136 mmol/L (ref 135–145)
Total Bilirubin: 0.7 mg/dL (ref 0.3–1.2)
Total Protein: 7.1 g/dL (ref 6.5–8.1)

## 2021-07-18 LAB — RESP PANEL BY RT-PCR (FLU A&B, COVID) ARPGX2
Influenza A by PCR: NEGATIVE
Influenza B by PCR: NEGATIVE
SARS Coronavirus 2 by RT PCR: NEGATIVE

## 2021-07-18 SURGERY — IRRIGATION AND DEBRIDEMENT ABSCESS
Anesthesia: General | Site: Perineum

## 2021-07-18 MED ORDER — EPHEDRINE SULFATE-NACL 50-0.9 MG/10ML-% IV SOSY
PREFILLED_SYRINGE | INTRAVENOUS | Status: DC | PRN
  Administered 2021-07-18: 10 mg via INTRAVENOUS

## 2021-07-18 MED ORDER — FENTANYL CITRATE (PF) 100 MCG/2ML IJ SOLN
INTRAMUSCULAR | Status: DC | PRN
  Administered 2021-07-18 (×2): 50 ug via INTRAVENOUS

## 2021-07-18 MED ORDER — PANTOPRAZOLE SODIUM 40 MG PO TBEC
80.0000 mg | DELAYED_RELEASE_TABLET | Freq: Every day | ORAL | Status: DC
  Administered 2021-07-18: 80 mg via ORAL
  Filled 2021-07-18: qty 2

## 2021-07-18 MED ORDER — CHLORTHALIDONE 25 MG PO TABS
25.0000 mg | ORAL_TABLET | Freq: Every morning | ORAL | Status: DC
  Filled 2021-07-18: qty 1

## 2021-07-18 MED ORDER — OXYCODONE HCL 5 MG PO TABS: 5.0000 mg | ORAL_TABLET | Freq: Once | ORAL | Status: DC | PRN

## 2021-07-18 MED ORDER — SUGAMMADEX SODIUM 500 MG/5ML IV SOLN
INTRAVENOUS | Status: DC | PRN
  Administered 2021-07-18: 400 mg via INTRAVENOUS

## 2021-07-18 MED ORDER — OXYCODONE-ACETAMINOPHEN 5-325 MG PO TABS
1.0000 | ORAL_TABLET | Freq: Once | ORAL | Status: AC
  Administered 2021-07-18: 1 via ORAL
  Filled 2021-07-18: qty 1

## 2021-07-18 MED ORDER — HYDROMORPHONE HCL 1 MG/ML IJ SOLN
0.5000 mg | INTRAMUSCULAR | Status: DC | PRN
  Administered 2021-07-18 – 2021-07-19 (×2): 1 mg via INTRAVENOUS

## 2021-07-18 MED ORDER — BISACODYL 10 MG RE SUPP
10.0000 mg | Freq: Every day | RECTAL | Status: DC | PRN
  Filled 2021-07-18: qty 1

## 2021-07-18 MED ORDER — ONDANSETRON HCL 4 MG/2ML IJ SOLN
4.0000 mg | Freq: Once | INTRAMUSCULAR | Status: AC
  Administered 2021-07-18: 4 mg via INTRAVENOUS
  Filled 2021-07-18: qty 2

## 2021-07-18 MED ORDER — FLEET ENEMA 7-19 GM/118ML RE ENEM
1.0000 | ENEMA | Freq: Once | RECTAL | Status: DC | PRN
  Filled 2021-07-18: qty 1

## 2021-07-18 MED ORDER — TAMSULOSIN HCL 0.4 MG PO CAPS
0.4000 mg | ORAL_CAPSULE | Freq: Every day | ORAL | Status: DC
  Administered 2021-07-18: 0.4 mg via ORAL
  Filled 2021-07-18: qty 1

## 2021-07-18 MED ORDER — FENTANYL CITRATE (PF) 100 MCG/2ML IJ SOLN
INTRAMUSCULAR | Status: AC
  Filled 2021-07-18: qty 2

## 2021-07-18 MED ORDER — SODIUM CHLORIDE 0.9 % IV SOLN
1.0000 g | Freq: Once | INTRAVENOUS | Status: AC
  Administered 2021-07-18: 1 g via INTRAVENOUS
  Filled 2021-07-18: qty 10

## 2021-07-18 MED ORDER — IOHEXOL 350 MG/ML SOLN
100.0000 mL | Freq: Once | INTRAVENOUS | Status: AC | PRN
  Administered 2021-07-18: 60 mL via INTRAVENOUS

## 2021-07-18 MED ORDER — PIPERACILLIN-TAZOBACTAM 3.375 G IVPB
3.3750 g | Freq: Three times a day (TID) | INTRAVENOUS | Status: DC
  Administered 2021-07-19: 3.375 g via INTRAVENOUS

## 2021-07-18 MED ORDER — POTASSIUM CHLORIDE IN NACL 20-0.45 MEQ/L-% IV SOLN
INTRAVENOUS | Status: DC
  Filled 2021-07-18 (×3): qty 1000

## 2021-07-18 MED ORDER — LIDOCAINE 2% (20 MG/ML) 5 ML SYRINGE
INTRAMUSCULAR | Status: DC | PRN
  Administered 2021-07-18: 80 mg via INTRAVENOUS

## 2021-07-18 MED ORDER — MORPHINE SULFATE (PF) 4 MG/ML IV SOLN
4.0000 mg | Freq: Once | INTRAVENOUS | Status: AC
  Administered 2021-07-18: 4 mg via INTRAVENOUS
  Filled 2021-07-18: qty 1

## 2021-07-18 MED ORDER — ROCURONIUM BROMIDE 10 MG/ML (PF) SYRINGE
PREFILLED_SYRINGE | INTRAVENOUS | Status: DC | PRN
  Administered 2021-07-18: 70 mg via INTRAVENOUS

## 2021-07-18 MED ORDER — OXYCODONE HCL 5 MG PO TABS: 5.0000 mg | ORAL_TABLET | ORAL | Status: DC | PRN

## 2021-07-18 MED ORDER — FENTANYL CITRATE PF 50 MCG/ML IJ SOSY
PREFILLED_SYRINGE | INTRAMUSCULAR | Status: AC
  Filled 2021-07-18: qty 2

## 2021-07-18 MED ORDER — PROPOFOL 10 MG/ML IV BOLUS
INTRAVENOUS | Status: AC
  Filled 2021-07-18: qty 20

## 2021-07-18 MED ORDER — DEXAMETHASONE SODIUM PHOSPHATE 10 MG/ML IJ SOLN
INTRAMUSCULAR | Status: AC
  Filled 2021-07-18: qty 1

## 2021-07-18 MED ORDER — DEXAMETHASONE SODIUM PHOSPHATE 10 MG/ML IJ SOLN
INTRAMUSCULAR | Status: DC | PRN
  Administered 2021-07-18: 5 mg via INTRAVENOUS

## 2021-07-18 MED ORDER — PIPERACILLIN-TAZOBACTAM 3.375 G IVPB 30 MIN
3.3750 g | Freq: Once | INTRAVENOUS | Status: AC
  Administered 2021-07-18: 3.375 g via INTRAVENOUS
  Filled 2021-07-18: qty 50

## 2021-07-18 MED ORDER — LIDOCAINE HCL (PF) 2 % IJ SOLN
INTRAMUSCULAR | Status: AC
  Filled 2021-07-18: qty 5

## 2021-07-18 MED ORDER — ONDANSETRON HCL 4 MG/2ML IJ SOLN
INTRAMUSCULAR | Status: DC | PRN
  Administered 2021-07-18: 4 mg via INTRAVENOUS

## 2021-07-18 MED ORDER — PHENYLEPHRINE HCL-NACL 20-0.9 MG/250ML-% IV SOLN
INTRAVENOUS | Status: DC | PRN
  Administered 2021-07-18: 50 ug/min via INTRAVENOUS

## 2021-07-18 MED ORDER — ACETAMINOPHEN 325 MG PO TABS: 650.0000 mg | ORAL_TABLET | ORAL | Status: DC | PRN

## 2021-07-18 MED ORDER — SUCCINYLCHOLINE CHLORIDE 200 MG/10ML IV SOSY
PREFILLED_SYRINGE | INTRAVENOUS | Status: AC
  Filled 2021-07-18: qty 10

## 2021-07-18 MED ORDER — ROCURONIUM BROMIDE 10 MG/ML (PF) SYRINGE
PREFILLED_SYRINGE | INTRAVENOUS | Status: AC
  Filled 2021-07-18: qty 10

## 2021-07-18 MED ORDER — FLUTICASONE PROPIONATE 50 MCG/ACT NA SUSP
2.0000 | Freq: Two times a day (BID) | NASAL | Status: DC | PRN
  Filled 2021-07-18: qty 16

## 2021-07-18 MED ORDER — FENTANYL CITRATE PF 50 MCG/ML IJ SOSY
25.0000 ug | PREFILLED_SYRINGE | INTRAMUSCULAR | Status: DC | PRN
Start: 2021-07-18 — End: 2021-07-19
  Administered 2021-07-18: 50 ug via INTRAVENOUS

## 2021-07-18 MED ORDER — CHLORHEXIDINE GLUCONATE 0.12 % MT SOLN
15.0000 mL | Freq: Once | OROMUCOSAL | Status: AC
  Administered 2021-07-18: 15 mL via OROMUCOSAL

## 2021-07-18 MED ORDER — HYDROMORPHONE HCL 1 MG/ML IJ SOLN: 0.5000 mg | INTRAMUSCULAR | Status: DC | PRN

## 2021-07-18 MED ORDER — SENNOSIDES-DOCUSATE SODIUM 8.6-50 MG PO TABS
1.0000 | ORAL_TABLET | Freq: Every evening | ORAL | Status: DC | PRN
  Filled 2021-07-18: qty 1

## 2021-07-18 MED ORDER — PROPOFOL 10 MG/ML IV BOLUS
INTRAVENOUS | Status: DC | PRN
  Administered 2021-07-18: 150 mg via INTRAVENOUS

## 2021-07-18 MED ORDER — LISINOPRIL 20 MG PO TABS
40.0000 mg | ORAL_TABLET | Freq: Every morning | ORAL | Status: DC
  Filled 2021-07-18: qty 2

## 2021-07-18 MED ORDER — 0.9 % SODIUM CHLORIDE (POUR BTL) OPTIME
TOPICAL | Status: DC | PRN
  Administered 2021-07-18: 1000 mL

## 2021-07-18 MED ORDER — LACTATED RINGERS IV SOLN: INTRAVENOUS | Status: DC

## 2021-07-18 MED ORDER — ONDANSETRON HCL 4 MG/2ML IJ SOLN: 4.0000 mg | INTRAMUSCULAR | Status: DC | PRN

## 2021-07-18 MED ORDER — ONDANSETRON HCL 4 MG/2ML IJ SOLN: 4.0000 mg | Freq: Once | INTRAMUSCULAR | Status: DC | PRN

## 2021-07-18 MED ORDER — ONDANSETRON HCL 4 MG/2ML IJ SOLN
INTRAMUSCULAR | Status: AC
  Filled 2021-07-18: qty 2

## 2021-07-18 MED ORDER — MIDAZOLAM HCL 2 MG/2ML IJ SOLN: INTRAMUSCULAR | Status: DC | PRN

## 2021-07-18 MED ORDER — PHENYLEPHRINE 40 MCG/ML (10ML) SYRINGE FOR IV PUSH (FOR BLOOD PRESSURE SUPPORT)
PREFILLED_SYRINGE | INTRAVENOUS | Status: DC | PRN
  Administered 2021-07-18: 120 ug via INTRAVENOUS

## 2021-07-18 MED ORDER — VANCOMYCIN HCL 1500 MG/300ML IV SOLN
1500.0000 mg | Freq: Once | INTRAVENOUS | Status: AC
Start: 2021-07-18 — End: 2021-07-18
  Administered 2021-07-18: 1500 mg via INTRAVENOUS
  Filled 2021-07-18: qty 300

## 2021-07-18 MED ORDER — AMISULPRIDE (ANTIEMETIC) 5 MG/2ML IV SOLN: 10.0000 mg | Freq: Once | INTRAVENOUS | Status: DC | PRN

## 2021-07-18 MED ORDER — HYDROMORPHONE HCL 1 MG/ML IJ SOLN
INTRAMUSCULAR | Status: AC
  Filled 2021-07-18: qty 1

## 2021-07-18 MED ORDER — OXYCODONE HCL 5 MG/5ML PO SOLN: 5.0000 mg | Freq: Once | ORAL | Status: DC | PRN

## 2021-07-18 SURGICAL SUPPLY — 20 items
BENZOIN TINCTURE PRP APPL 2/3 (GAUZE/BANDAGES/DRESSINGS) ×2 IMPLANT
BNDG GAUZE ELAST 4 BULKY (GAUZE/BANDAGES/DRESSINGS) ×2 IMPLANT
DECANTER SPIKE VIAL GLASS SM (MISCELLANEOUS) ×2 IMPLANT
DRAIN PENROSE 0.25X18 (DRAIN) IMPLANT
DRAIN PENROSE 0.5X18 (DRAIN) IMPLANT
DRAPE LAPAROTOMY T 98X78 PEDS (DRAPES) ×2 IMPLANT
DRSG KUZMA FLUFF (GAUZE/BANDAGES/DRESSINGS) ×2 IMPLANT
ELECT REM PT RETURN 15FT ADLT (MISCELLANEOUS) ×2 IMPLANT
GAUZE SPONGE 4X4 12PLY STRL (GAUZE/BANDAGES/DRESSINGS) ×2 IMPLANT
GLOVE SURG POLYISO LF SZ8 (GLOVE) ×4 IMPLANT
GOWN STRL REUS W/TWL XL LVL3 (GOWN DISPOSABLE) ×4 IMPLANT
KIT BASIN OR (CUSTOM PROCEDURE TRAY) ×2 IMPLANT
KIT TURNOVER KIT A (KITS) IMPLANT
NDL SAFETY ECLIPSE 18X1.5 (NEEDLE) ×1 IMPLANT
NEEDLE HYPO 18GX1.5 SHARP (NEEDLE) ×1
NEEDLE HYPO 22GX1.5 SAFETY (NEEDLE) ×2 IMPLANT
PACK GENERAL/GYN (CUSTOM PROCEDURE TRAY) ×2 IMPLANT
SUPPORT SCROTAL LG STRP (MISCELLANEOUS) ×2 IMPLANT
SYR CONTROL 10ML LL (SYRINGE) ×2 IMPLANT
TAPE CLOTH SURG 6X10 WHT LF (GAUZE/BANDAGES/DRESSINGS) ×2 IMPLANT

## 2021-07-18 NOTE — Anesthesia Procedure Notes (Signed)
Date/Time: 07/18/2021 7:23 PM Performed by: Cynda Familia, CRNA Oxygen Delivery Method: Simple face mask Placement Confirmation: positive ETCO2 and breath sounds checked- equal and bilateral Dental Injury: Teeth and Oropharynx as per pre-operative assessment

## 2021-07-18 NOTE — ED Triage Notes (Signed)
Patient is alert and oriented x4. He is complaining of groin swelling and pain. Feels a lump with pain radiating to under his penis. He had a cystoscope procedure done Thursday   Hx: bladder cancer

## 2021-07-18 NOTE — Progress Notes (Signed)
A consult was received from an ED physician for vancomycin per pharmacy dosing.  The patient's profile has been reviewed for ht/wt/allergies/indication/available labs.  Scr 1.6 (appears to be near baseline). No current weight in chart, most recent is 81 kg from 06/14/21  A one time order has been placed for vancomycin 1500mg  IV x1.  Further antibiotics/pharmacy consults should be ordered by admitting physician if indicated.                       Thank you,  Dimple Nanas, PharmD 07/18/2021 2:08 PM

## 2021-07-18 NOTE — Anesthesia Preprocedure Evaluation (Addendum)
Anesthesia Evaluation  Patient identified by MRN, date of birth, ID band Patient awake    Reviewed: Allergy & Precautions, NPO status , Patient's Chart, lab work & pertinent test results  Airway Mallampati: II  TM Distance: >3 FB Neck ROM: Full    Dental no notable dental hx.    Pulmonary former smoker,    Pulmonary exam normal breath sounds clear to auscultation       Cardiovascular Exercise Tolerance: Good METS: 3 - Mets hypertension, Pt. on medications Normal cardiovascular exam+ dysrhythmias (s/p ablation) Supra Ventricular Tachycardia  Rhythm:Regular Rate:Normal  2019 ECHO:  - Left ventricle: LVEF is approximately 50% with hypokinesis of the  distal ilnferior, inferoseptal walls. The cavity size was  moderately dilated. Wall thickness was increased in a pattern of  mild LVH. Systolic function was normal. The estimated ejection  fraction was in the range of 50% to 55%.  - Aortic valve: There was trivial regurgitation.  - Mitral valve: Moderately calcified annulus. Mildly thickened  leaflets . There was mild regurgitation.  - Left atrium: The atrium was mildly dilated.  - Pericardium, extracardiac: A small pericardial effusion was  identified.    Neuro/Psych negative neurological ROS  negative psych ROS   GI/Hepatic Neg liver ROS, Diverticulosis    Endo/Other    Renal/GU ARF and Renal InsufficiencyRenal disease   Suprapubic abscess H/o bladder cancer    Musculoskeletal  (+) Arthritis ,   Abdominal   Peds negative pediatric ROS (+)  Hematology  (+) anemia ,   Anesthesia Other Findings   Reproductive/Obstetrics negative OB ROS                          Anesthesia Physical Anesthesia Plan  ASA: 3 and emergent  Anesthesia Plan: General   Post-op Pain Management:    Induction: Intravenous  PONV Risk Score and Plan: 2 and Treatment may vary due to age or medical  condition, Ondansetron and Dexamethasone  Airway Management Planned: Oral ETT  Additional Equipment: None  Intra-op Plan:   Post-operative Plan: Extubation in OR  Informed Consent: I have reviewed the patients History and Physical, chart, labs and discussed the procedure including the risks, benefits and alternatives for the proposed anesthesia with the patient or authorized representative who has indicated his/her understanding and acceptance.     Dental advisory given  Plan Discussed with: CRNA, Anesthesiologist and Surgeon  Anesthesia Plan Comments: (No current nausea/vomiting or reflux. GETA. Existing IV. Norton Blizzard, MD  )       Anesthesia Quick Evaluation

## 2021-07-18 NOTE — Anesthesia Procedure Notes (Signed)
Procedure Name: Intubation Date/Time: 07/18/2021 6:51 PM Performed by: Cynda Familia, CRNA Pre-anesthesia Checklist: Patient identified, Emergency Drugs available, Suction available and Patient being monitored Patient Re-evaluated:Patient Re-evaluated prior to induction Oxygen Delivery Method: Circle System Utilized Preoxygenation: Pre-oxygenation with 100% oxygen Induction Type: IV induction and Cricoid Pressure applied Ventilation: Mask ventilation without difficulty Laryngoscope Size: Miller and 2 Grade View: Grade I Tube type: Oral Number of attempts: 1 Airway Equipment and Method: Stylet Placement Confirmation: ETT inserted through vocal cords under direct vision, positive ETCO2 and breath sounds checked- equal and bilateral Secured at: 22 cm Tube secured with: Tape Dental Injury: Teeth and Oropharynx as per pre-operative assessment  Comments: Smooth IV induction Dr Elgie Congo- intubation AM CRNA atraumatic- teeth and mouth as preop- chipping in front upper and lower- bilat BS

## 2021-07-18 NOTE — ED Provider Notes (Signed)
[facility] DEPT Provider Note   CSN: [id number] Arrival date & time: 07/18/21  0502     History Chief Complaint  Patient presents with   Groin Swelling   Mass    In groin area    Richard Austin is a 75 y.o. male.  Patient is a 75 year old male with a history of hypertension, bladder tumors status post resection and BCG treatments who is presenting today due to worsening mass, pain and swelling in his groin area.  Patient reports having a cystoscopy done on Thursday of last week for surveillance everything looked normal and no intervention was taken.  He felt fine on Thursday and Friday and Saturday started noticing a swollen tender area just right of his penis in his groin area.  In the last 2 days it has gradually worsened and has now become much more tender, swollen with swelling of the shaft of the penis and erythematous.  He has not had fever, nausea or vomiting.  He has been having normal bowel movements.  He has been able to eat without difficulty.  He has had no difficulty with urinating.  No blood noted in his urine.  No prior episodes of similar things happening.  The history is provided by the patient.      Past Medical History:  Diagnosis Date   Arthritis    Bladder tumor    Diverticulitis    Diverticulosis    Hypertension    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 2019   Pre-diabetes     Patient Active Problem List   Diagnosis Date Noted   SBO (small bowel obstruction) ([place]) 07/06/2018   AKI (acute kidney injury) ([place]) 07/06/2018   Hyperglycemia 07/06/2018   Hypertension 07/06/2018   Leukocytosis 07/06/2018   Bladder mass 07/06/2018   Pericardial effusion 07/06/2018   Hypercalcemia 07/06/2018    Past Surgical History:  Procedure Laterality Date   ABDOMINAL SURGERY     ABLATION FOR SVT  2000 OR 2001   APPENDECTOMY  1970s   COLOSTOMY     LATER REVERSED   LUMBAR LAMINECTOMY  1970s   TRANSURETHRAL RESECTION OF BLADDER TUMOR  WITH MITOMYCIN-C N/A 08/21/2018   Procedure: TRANSURETHRAL RESECTION OF BLADDER TUMOR WITH GEMCITABINE;  Surgeon: Lucas Mallow, MD;  Location: [facility];  Service: Urology;  Laterality: N/A;       Family History  Problem Relation Age of Onset   Peptic Ulcer Mother    Cirrhosis Mother    Alcohol abuse Mother    Bladder Cancer Father    Throat cancer Maternal Grandfather     Social History   Tobacco Use   Smoking status: Former    Packs/day: 1.50    Years: 30.00    Pack years: 45.00    Types: Cigarettes   Smokeless tobacco: Never   Tobacco comments:    QUIT 31 YRS AGO  Vaping Use   Vaping Use: Every day  Substance Use Topics   Alcohol use: Yes    Comment: SELDOM   Drug use: Not Currently    Home Medications Prior to Admission medications   Medication Sig Start Date End Date Taking? Authorizing Provider  chlorthalidone (HYGROTON) 25 MG tablet Take 25 mg by mouth daily.    [provider]  fluticasone (FLONASE) 50 MCG/ACT nasal spray Place 2 sprays into both nostrils 2 (two) times daily as needed for allergies or rhinitis.     [provider]  hydrocortisone 2.5 % cream Apply 1  application topically 2 (two) times daily as needed (for itching).     [provider]  Javier Docker Oil 350 MG CAPS Take 350 mg by mouth every evening.     [provider]  lisinopril (PRINIVIL,ZESTRIL) 40 MG tablet Take 40 mg by mouth daily.    [provider]  omeprazole (PRILOSEC) 40 MG capsule Take 40 mg by mouth daily as needed (for acid reflux).     [provider]  sildenafil (VIAGRA) 100 MG tablet Take 100 mg by mouth daily as needed for erectile dysfunction.    [provider]  tamsulosin (FLOMAX) 0.4 MG CAPS capsule Take 0.4 mg by mouth daily after supper.    [provider]    Allergies    Amlodipine, Bystolic [nebivolol hcl], Statins, and Latex  Review of Systems   Review of Systems  All other systems reviewed and  are negative.  Physical Exam Updated Vital Signs BP 127/60 (BP Location: Right Arm)   Pulse 84   Temp 98.3 F (36.8 C) (Oral)   Resp 18   SpO2 98%   Physical Exam Vitals and nursing note reviewed.  Constitutional:      General: He is not in acute distress.    Appearance: He is well-developed.  HENT:     Head: Normocephalic and atraumatic.  Eyes:     Conjunctiva/sclera: Conjunctivae normal.     Pupils: Pupils are equal, round, and reactive to light.  Cardiovascular:     Rate and Rhythm: Normal rate and regular rhythm.     Heart sounds: No murmur heard. Pulmonary:     Effort: Pulmonary effort is normal. No respiratory distress.     Breath sounds: Normal breath sounds. No wheezing or rales.  Abdominal:     General: There is no distension.     Palpations: Abdomen is soft.     Tenderness: There is no abdominal tenderness. There is no guarding or rebound.     Comments: Multiple midline well-healed abdominal scars.  No specific abdominal pain noted.  Genitourinary:    Penis: Circumcised. Swelling present.      Testes: Normal.       Comments: The inguinal canal also feels firm but no palpable hernia at this time.  He has no lesions present.Left side of the pelvis is soft.   Musculoskeletal:        General: No tenderness. Normal range of motion.     Cervical back: Normal range of motion and neck supple.  Skin:    General: Skin is warm and dry.     Findings: No erythema or rash.  Neurological:     Mental Status: He is alert and oriented to person, place, and time.  Psychiatric:        Behavior: Behavior normal.    ED Results / Procedures / Treatments   Labs (all labs ordered are listed, but only abnormal results are displayed) Labs Reviewed  CBC WITH DIFFERENTIAL/PLATELET - Abnormal; Notable for the following components:      Result Value   WBC 13.4 (*)    RBC 3.87 (*)    Hemoglobin 11.3 (*)    HCT 34.2 (*)    Neutro Abs 11.3 (*)    Abs Immature Granulocytes 0.08  (*)    All other components within normal limits  COMPREHENSIVE METABOLIC PANEL - Abnormal; Notable for the following components:   Potassium 3.4 (*)    CO2 20 (*)    Glucose, Bld 234 (*)  BUN 48 (*)    Creatinine, Ser 1.60 (*)    AST 14 (*)    GFR, Estimated 45 (*)    All other components within normal limits  URINALYSIS, ROUTINE W REFLEX MICROSCOPIC    EKG None  Radiology CT ABDOMEN PELVIS W CONTRAST  Result Date: 07/18/2021 CLINICAL DATA:  Abdominal pain, groin swelling, hernia suspected, history of bladder cancer EXAM: CT ABDOMEN AND PELVIS WITH CONTRAST TECHNIQUE: Multidetector CT imaging of the abdomen and pelvis was performed using the standard protocol following bolus administration of intravenous contrast. CONTRAST:  49mL OMNIPAQUE IOHEXOL 350 MG/ML SOLN COMPARISON:  01/12/2021 FINDINGS: Lower chest: No acute abnormality.  Small hiatal hernia. Hepatobiliary: No solid liver abnormality is seen. No gallstones, gallbladder wall thickening, or biliary dilatation. Pancreas: Unremarkable. No pancreatic ductal dilatation or surrounding inflammatory changes. Spleen: Normal in size without significant abnormality. Adrenals/Urinary Tract: Adrenal glands are unremarkable. Kidneys are normal, without renal calculi, solid lesion, or hydronephrosis. Bladder is unremarkable. Stomach/Bowel: Stomach is within normal limits. Appendix is not clearly visualized and may be surgically absent. No evidence of bowel wall thickening, distention, or inflammatory changes. Status post sigmoid colon resection and reanastomosis. Vascular/Lymphatic: Aortic atherosclerosis. No enlarged abdominal or pelvic lymph nodes. Reproductive: There is a rim enhancing, elongated fluid collection about the right aspect of the pubic mild and abutting the base of the penis, measuring 6.1 x 3.4 x 3.2 cm (series 6, image 82, series 2, image 84). Other: Small, fat containing bilateral inguinal hernias. Status post ventral mesh hernia  repair. No abdominopelvic ascites. Musculoskeletal: No acute or significant osseous findings. IMPRESSION: 1. There is a rim enhancing, elongated fluid collection about the right aspect of the pubic mild and abutting the base of the penis, measuring 6.1 x 3.4 x 3.2 cm. This is concerning for hematoma or abscess. The presence or absence of infection is not established by CT. 2. Small, fat containing bilateral inguinal hernias. 3. Status post sigmoid colon resection and reanastomosis. Aortic Atherosclerosis (ICD10-I70.0). Electronically Signed   By: Delanna Ahmadi M.D.   On: 07/18/2021 13:42    Procedures Procedures   Medications Ordered in ED Medications  oxyCODONE-acetaminophen (PERCOCET/ROXICET) 5-325 MG per tablet 1 tablet (1 tablet Oral Given 07/18/21 1254)  iohexol (OMNIPAQUE) 350 MG/ML injection 100 mL (60 mLs Intravenous Contrast Given 07/18/21 1327)    ED Course  I have reviewed the triage vital signs and the nursing notes.  Pertinent labs & imaging results that were available during my care of the patient were reviewed by me and considered in my medical decision making (see chart for details).    MDM Rules/Calculators/A&P                           Patient presenting with a 2-day history of worsening swelling and tenderness in his groin area.  Patient has notable swelling and induration noted to the mons pubis area just directly proximal to the penis but also swelling of the shaft of his penis and tenderness to the right side.    Testicles are both normal with no tenderness or fullness. With the erythema concern for possible infectious etiology although patient is having no systemic symptoms.  Also given patient's extensive abdominal surgery past could be an atypical hernia.  Patient is not having any urinary symptoms at this time and lower suspicion for UTI.  Labs and imaging are pending.  He has no obstructive symptoms at this time as he is having no  nausea vomiting and has had normal bowel  movements.  1:56 PM CBC with leukocytosis of 13, stable hemoglobin, CMP with creatinine of 1.6 which appears to be baseline.  CT shows debris and enhancing elongated fluid collection about the right aspect of the pubic symphysis and abutting the base of the penis measuring 6 x 3 x 3 cm.  Based on the erythema noted on exam as well concern for infection.  Will discuss with urology.  Patient started on vancomycin and rocephin.  Dr. Jeffie Pollock to see the pt.   MDM   Amount and/or Complexity of Data Reviewed Clinical lab tests: ordered and reviewed Tests in the radiology section of CPT: ordered and reviewed Independent visualization of images, tracings, or specimens: yes     Final Clinical Impression(s) / ED Diagnoses Final diagnoses:  Abscess of groin, right    Rx / DC Orders ED Discharge Orders     None        Blanchie Dessert, MD 07/18/21 1543

## 2021-07-18 NOTE — Anesthesia Postprocedure Evaluation (Signed)
Anesthesia Post Note  Patient: Richard Austin  Procedure(s) Performed: IRRIGATION AND DEBRIDEMENT ABSCESS (Perineum)     Patient location during evaluation: PACU Anesthesia Type: General Level of consciousness: awake Pain management: pain level controlled Vital Signs Assessment: post-procedure vital signs reviewed and stable Respiratory status: spontaneous breathing and respiratory function stable Cardiovascular status: stable Postop Assessment: no apparent nausea or vomiting Anesthetic complications: no   No notable events documented.  Last Vitals:  Vitals:   07/18/21 2000 07/18/21 2005  BP: 121/61   Pulse: 95 97  Resp: 17 19  Temp:    SpO2: 97% 93%    Last Pain:  Vitals:   07/18/21 2005  TempSrc:   PainSc: 0-No pain                 Merlinda Frederick

## 2021-07-18 NOTE — Op Note (Signed)
Procedure: I&D of suprapubic abscess and removal of foreign body.  Preop diagnosis: Suprapubic abscess.  Postop diagnosis: Same with a retained stitch from prior surgery.  Surgeon: Dr. Irine Seal.  Anesthesia: General.  Specimen: Aerobic and anaerobic wound cultures.  Drain: None.  EBL: None.  Complications: None.  Indications: Richard Austin is a 75 year old male who presented to the emergency room today with a suprapubic mass with redness and tenderness that on CT appeared consistent with an abscess that was adjacent to the base of the penis.  He has a history of complicated abdominal surgery 14 years ago and has had subsequent CT scan that showed some inflammatory changes in the area of the abscess but now a defined fluid collection is apparent with surrounding inflammatory changes.  It was felt I&D of the abscess was indicated.  Procedure: He was given vancomycin and Zosyn.  He was taken operating room where general anesthetic was induced.  He was placed in the supine position and fitted with PAS hose.  His lower abdomen and genitalia were clipped and he was prepped with a combination of ChloraPrep with Betadine on the genitalia.  He was then draped in the usual sterile fashion.  Initially an attempt to aspirate the fluid collection with an 18-gauge needle and a 50 cc syringe was unsuccessful due to lack of return.  I then made a midline incision over the mass in the suprapubic area with a scalpel and carried down through the subcutaneous tissues with the Bovie.  A fluid collection was encountered approximately 2 cm deep in the midline.  The fluid was foul-smelling purulent material in both aerobic and anaerobic cultures were obtained.  The abscess cavity was drained and then opened widely.  In the superior aspect of the abscess cavity an 0 Prolene stitch with a knot was noted.  It was removed without difficulty as it was not firmly attached.  The abscess cavity extended distally down to the  base of the penis but it did not appear to be associated with the penis.  Once the cavity was sufficiently opened and cleared of purulent material, hemostasis was assured and a saline soaked Kerlix was packed into the cavity.  The Curlex was trimmed to an appropriate length and additional dressing of 4 x 4's and an adhesive bandage was applied.  His anesthetic was then reversed and he was moved recovery room in stable condition there were no complications with the procedure.

## 2021-07-18 NOTE — Consult Note (Addendum)
Urology Consult   Physician requesting consult: Blanchie Dessert  Reason for consult: Suprapubic abscess   History of Present Illness: Richard Austin is a 75 y.o.  male with PMH significant for bladder cancer (s/p cysto 07/14/21), diverticulitis s/p colectomy and colostomy reversal, and arthritis who presented to the ED early this morning with c/o right groin pain. He states he first noted a small area of discomfort along the right aspect of his penis on Sat.  He worked in the yard Sunday and that night noted it had increased in size and pain.  This morning the area had increased in size again to involve the suprapubic area and the right lateral aspect of his penis.  He denies any trauma to the area, no excessive stressing or pulling on the area, and no pimples/skin breakdown. No similar episodes in the past. He is voiding without difficulty.  He denies F/C, HA, CP, SOB, N/V, diarrhea/constipation, and hematuria.  WBC 13.4, H/H 11.3/34.2, Cr 1.6.  UA pending. CT scan shows a rim enhancing elongated fluid collection at the right aspect of the public mild and abutting the base of the penis measuring 6.1 x 3.4 x 3.2 cm concerning for hematoma or abscess.   He is currently resting in the ED and requesting "stronger pain meds in the IV" because one Oxycodone did not relieve his pain.  He has not eaten since 7 pm last night and last drank ginger ale at 11 am today.  Past Medical History:  Diagnosis Date   Arthritis    Bladder tumor    Diverticulitis    Diverticulosis    Hypertension    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 2019   Pre-diabetes     Past Surgical History:  Procedure Laterality Date   ABDOMINAL SURGERY     ABLATION FOR SVT  2000 OR 2001   APPENDECTOMY  1970s   COLOSTOMY     LATER REVERSED   LUMBAR LAMINECTOMY  1970s   TRANSURETHRAL RESECTION OF BLADDER TUMOR WITH MITOMYCIN-C N/A 08/21/2018   Procedure: TRANSURETHRAL RESECTION OF BLADDER TUMOR WITH GEMCITABINE;  Surgeon: Lucas Mallow, MD;  Location: [facility];  Service: Urology;  Laterality: N/A;    Current Hospital Medications:  Home Meds:  . No outpatient medications have been marked as taking for the 07/18/21 encounter [facility] Encounter).     Scheduled Meds:   morphine injection  4 mg Intravenous Once   ondansetron  4 mg Intravenous Once   Continuous Infusions:  vancomycin     PRN Meds:.  Allergies:  Allergies  Allergen Reactions   Amlodipine Other (See Comments)    Muscle tightness, fatigue    Bystolic [Nebivolol Hcl] Other (See Comments)    Muscle tightness and fatigue    Statins Other (See Comments)    Muscle tightness and fatigue    Latex Rash    Family History  Problem Relation Age of Onset   Peptic Ulcer Mother    Cirrhosis Mother    Alcohol abuse Mother    Bladder Cancer Father    Throat cancer Maternal Grandfather     Social History:  reports that he has quit smoking. He has a 45.00 pack-year smoking history. He has never used smokeless tobacco. He reports current alcohol use. He reports that he does not currently use drugs.  ROS: A complete review of systems was performed.  All systems are negative except for pertinent findings as noted.  Physical Exam:  Vital signs in last 24 hours:  Temp:  [98.3 F (36.8 C)] 98.3 F (36.8 C) (11/07 0609) Pulse Rate:  [80-84] 82 (11/07 1434) Resp:  [16-18] 16 (11/07 1434) BP: (111-147)/(56-96) 147/96 (11/07 1434) SpO2:  [96 %-100 %] 99 % (11/07 1434) Constitutional:  Alert and oriented, No acute distress Cardiovascular: Regular rate and rhythm Respiratory: Normal respiratory effort GI: Abdomen is soft, nontender, nondistended, no abdominal masses GU: circum penis with no skin breakdown or discharge; moderate size area of erythema and induration in suprapubic area descending down to right lateral aspect of penis; tender to palp; testicles non tender with no masses or edema Lymphatic: No lymphadenopathy left groin; minimal  lymphadenopathy right groin  Neurologic: Grossly intact, no focal deficits Psychiatric: Normal mood and affect  Laboratory Data:  Recent Labs    07/18/21 1221  WBC 13.4*  HGB 11.3*  HCT 34.2*  PLT 218    Recent Labs    07/18/21 1221  NA 136  K 3.4*  CL 107  GLUCOSE 234*  BUN 48*  CALCIUM 9.0  CREATININE 1.60*     Results for orders placed or performed during the hospital encounter of 07/18/21 (from the past 24 hour(s))  CBC with Differential/Platelet     Status: Abnormal   Collection Time: 07/18/21 12:21 PM  Result Value Ref Range   WBC 13.4 (H) 4.0 - 10.5 K/uL   RBC 3.87 (L) 4.22 - 5.81 MIL/uL   Hemoglobin 11.3 (L) 13.0 - 17.0 g/dL   HCT 34.2 (L) 39.0 - 52.0 %   MCV 88.4 80.0 - 100.0 fL   MCH 29.2 26.0 - 34.0 pg   MCHC 33.0 30.0 - 36.0 g/dL   RDW 15.1 11.5 - 15.5 %   Platelets 218 150 - 400 K/uL   nRBC 0.0 0.0 - 0.2 %   Neutrophils Relative % 84 %   Neutro Abs 11.3 (H) 1.7 - 7.7 K/uL   Lymphocytes Relative 6 %   Lymphs Abs 0.8 0.7 - 4.0 K/uL   Monocytes Relative 7 %   Monocytes Absolute 0.9 0.1 - 1.0 K/uL   Eosinophils Relative 2 %   Eosinophils Absolute 0.2 0.0 - 0.5 K/uL   Basophils Relative 0 %   Basophils Absolute 0.0 0.0 - 0.1 K/uL   Immature Granulocytes 1 %   Abs Immature Granulocytes 0.08 (H) 0.00 - 0.07 K/uL  Comprehensive metabolic panel     Status: Abnormal   Collection Time: 07/18/21 12:21 PM  Result Value Ref Range   Sodium 136 135 - 145 mmol/L   Potassium 3.4 (L) 3.5 - 5.1 mmol/L   Chloride 107 98 - 111 mmol/L   CO2 20 (L) 22 - 32 mmol/L   Glucose, Bld 234 (H) 70 - 99 mg/dL   BUN 48 (H) 8 - 23 mg/dL   Creatinine, Ser 1.60 (H) 0.61 - 1.24 mg/dL   Calcium 9.0 8.9 - 10.3 mg/dL   Total Protein 7.1 6.5 - 8.1 g/dL   Albumin 4.0 3.5 - 5.0 g/dL   AST 14 (L) 15 - 41 U/L   ALT 15 0 - 44 U/L   Alkaline Phosphatase 73 38 - 126 U/L   Total Bilirubin 0.7 0.3 - 1.2 mg/dL   GFR, Estimated 45 (L) >60 mL/min   Anion gap 9 5 - 15   No results found  for this or any previous visit (from the past 240 hour(s)).  Renal Function: Recent Labs    07/18/21 1221  CREATININE 1.60*   CrCl cannot be calculated (Unknown ideal weight.).  Radiologic Imaging: CT ABDOMEN PELVIS W CONTRAST  Result Date: 07/18/2021 CLINICAL DATA:  Abdominal pain, groin swelling, hernia suspected, history of bladder cancer EXAM: CT ABDOMEN AND PELVIS WITH CONTRAST TECHNIQUE: Multidetector CT imaging of the abdomen and pelvis was performed using the standard protocol following bolus administration of intravenous contrast. CONTRAST:  58mL OMNIPAQUE IOHEXOL 350 MG/ML SOLN COMPARISON:  01/12/2021 FINDINGS: Lower chest: No acute abnormality.  Small hiatal hernia. Hepatobiliary: No solid liver abnormality is seen. No gallstones, gallbladder wall thickening, or biliary dilatation. Pancreas: Unremarkable. No pancreatic ductal dilatation or surrounding inflammatory changes. Spleen: Normal in size without significant abnormality. Adrenals/Urinary Tract: Adrenal glands are unremarkable. Kidneys are normal, without renal calculi, solid lesion, or hydronephrosis. Bladder is unremarkable. Stomach/Bowel: Stomach is within normal limits. Appendix is not clearly visualized and may be surgically absent. No evidence of bowel wall thickening, distention, or inflammatory changes. Status post sigmoid colon resection and reanastomosis. Vascular/Lymphatic: Aortic atherosclerosis. No enlarged abdominal or pelvic lymph nodes. Reproductive: There is a rim enhancing, elongated fluid collection about the right aspect of the pubic mild and abutting the base of the penis, measuring 6.1 x 3.4 x 3.2 cm (series 6, image 82, series 2, image 84). Other: Small, fat containing bilateral inguinal hernias. Status post ventral mesh hernia repair. No abdominopelvic ascites. Musculoskeletal: No acute or significant osseous findings. IMPRESSION: 1. There is a rim enhancing, elongated fluid collection about the right aspect of  the pubic mild and abutting the base of the penis, measuring 6.1 x 3.4 x 3.2 cm. This is concerning for hematoma or abscess. The presence or absence of infection is not established by CT. 2. Small, fat containing bilateral inguinal hernias. 3. Status post sigmoid colon resection and reanastomosis. Aortic Atherosclerosis (ICD10-I70.0). Electronically Signed   By: Delanna Ahmadi M.D.   On: 07/18/2021 13:42     Impression/Recommendation  Suprapubic abscess--no known injury or source. Dr. Jeffie Pollock will eval this afternoon and likely take to OR for I/D.   Continue IV Vanc  NPO  IV Dilaudid for pain prn   Debbrah Alar 07/18/2021, 3:26 PM

## 2021-07-18 NOTE — Transfer of Care (Signed)
Immediate Anesthesia Transfer of Care Note  Patient: Richard Austin  Procedure(s) Performed: IRRIGATION AND DEBRIDEMENT ABSCESS (Perineum)  Patient Location: PACU  Anesthesia Type:General  Level of Consciousness: awake and alert   Airway & Oxygen Therapy: Patient Spontanous Breathing and Patient connected to face mask oxygen  Post-op Assessment: Report given to RN and Post -op Vital signs reviewed and stable  Post vital signs: Reviewed  Last Vitals:  Vitals Value Taken Time  BP    Temp    Pulse    Resp    SpO2      Last Pain:  Vitals:   07/18/21 1756  TempSrc: Oral  PainSc: 10-Worst pain ever      Patients Stated Pain Goal: 5 (10/62/69 4854)  Complications: No notable events documented.

## 2021-07-19 ENCOUNTER — Encounter (HOSPITAL_COMMUNITY): Payer: Self-pay | Admitting: Urology

## 2021-07-19 DIAGNOSIS — L02211 Cutaneous abscess of abdominal wall: Secondary | ICD-10-CM | POA: Diagnosis not present

## 2021-07-19 DIAGNOSIS — L02214 Cutaneous abscess of groin: Secondary | ICD-10-CM

## 2021-07-19 MED ORDER — PIPERACILLIN-TAZOBACTAM 3.375 G IVPB
INTRAVENOUS | Status: AC
  Filled 2021-07-19: qty 50

## 2021-07-19 MED ORDER — HYDROMORPHONE HCL 1 MG/ML IJ SOLN
INTRAMUSCULAR | Status: AC
  Filled 2021-07-19: qty 1

## 2021-07-19 MED ORDER — CEFDINIR 300 MG PO CAPS: 300.0000 mg | ORAL_CAPSULE | Freq: Two times a day (BID) | ORAL | 0 refills | Status: DC

## 2021-07-19 NOTE — Progress Notes (Signed)
1 Day Post-Op  Subjective: Richard Austin is doing well s/p I&D of SP abscess and removal of stitch remnant.  He has no pain or fever.   ROS:  Review of Systems  All other systems reviewed and are negative.  Anti-infectives: Anti-infectives (From admission, onward)    Start     Dose/Rate Route Frequency Ordered Stop   07/19/21 0208  piperacillin-tazobactam (ZOSYN) 3.375 GM/50ML IVPB       Note to Pharmacy: Nicholes Rough   : cabinet override      07/19/21 0208 07/19/21 0219   07/19/21 0200  piperacillin-tazobactam (ZOSYN) IVPB 3.375 g        3.375 g 12.5 mL/hr over 240 Minutes Intravenous Every 8 hours 07/18/21 2042 07/26/21 0159   07/18/21 1800  piperacillin-tazobactam (ZOSYN) IVPB 3.375 g       Note to Pharmacy: Need this stat as he will be going to the OR soon.   He will probably need to remain on this med in the post op period as well.   3.375 g 100 mL/hr over 30 Minutes Intravenous  Once 07/18/21 1756 07/18/21 1852   07/18/21 1430  vancomycin (VANCOREADY) IVPB 1500 mg/300 mL        1,500 mg 150 mL/hr over 120 Minutes Intravenous  Once 07/18/21 1417 07/18/21 1753   07/18/21 1430  cefTRIAXone (ROCEPHIN) 1 g in sodium chloride 0.9 % 100 mL IVPB        1 g 200 mL/hr over 30 Minutes Intravenous  Once 07/18/21 1424 07/18/21 1508       Current Facility-Administered Medications  Medication Dose Route Frequency Provider Last Rate Last Admin   0.45 % NaCl with KCl 20 mEq / L infusion   Intravenous Continuous Irine Seal, MD 100 mL/hr at 07/18/21 2224 New Bag at 07/18/21 2224   acetaminophen (TYLENOL) tablet 650 mg  650 mg Oral Q4H PRN Irine Seal, MD       bisacodyl (DULCOLAX) suppository 10 mg  10 mg Rectal Daily PRN Irine Seal, MD       chlorthalidone (HYGROTON) tablet 25 mg  25 mg Oral q morning Irine Seal, MD       fentaNYL (SUBLIMAZE) 50 MCG/ML injection            fluticasone (FLONASE) 50 MCG/ACT nasal spray 2 spray  2 spray Each Nare BID PRN Irine Seal, MD       HYDROmorphone  (DILAUDID) 1 MG/ML injection            HYDROmorphone (DILAUDID) injection 0.5-1 mg  0.5-1 mg Intravenous Q2H PRN Irine Seal, MD   1 mg at 07/18/21 2215   lisinopril (ZESTRIL) tablet 40 mg  40 mg Oral q morning Irine Seal, MD       ondansetron [facility]) injection 4 mg  4 mg Intravenous Q4H PRN Irine Seal, MD       oxyCODONE (Oxy IR/ROXICODONE) immediate release tablet 5 mg  5 mg Oral Q4H PRN Irine Seal, MD       pantoprazole (PROTONIX) EC tablet 80 mg  80 mg Oral QHS Irine Seal, MD   80 mg at 07/18/21 2226   piperacillin-tazobactam (ZOSYN) IVPB 3.375 g  3.375 g Intravenous Gretta Cool, MD   Stopped at 07/19/21 0600   senna-docusate (Senokot-S) tablet 1 tablet  1 tablet Oral QHS PRN Irine Seal, MD       sodium phosphate (FLEET) 7-19 GM/118ML enema 1 enema  1 enema Rectal Once PRN Irine Seal, MD  tamsulosin (FLOMAX) capsule 0.4 mg  0.4 mg Oral QHS Irine Seal, MD   0.4 mg at 07/18/21 2226     Objective: Vital signs in last 24 hours: Temp:  [97.5 F (36.4 C)-98.9 F (37.2 C)] 98.3 F (36.8 C) (11/08 0700) Pulse Rate:  [74-97] 79 (11/08 0700) Resp:  [10-19] 16 (11/08 0700) BP: (96-147)/(56-96) 115/75 (11/08 0700) SpO2:  [88 %-99 %] 96 % (11/08 0700) Weight:  [79.9 kg] 79.9 kg (11/07 1756)  Intake/Output from previous day: 11/07 0701 - 11/08 0700 In: 2250 [P.O.:200; I.V.:1800; IV Piggyback:50] Out: 660 [Urine:650; Blood:10] Intake/Output this shift: No intake/output data recorded.   Physical Exam Vitals reviewed.  Constitutional:      Appearance: Normal appearance.  Abdominal:     Comments: Wound is clean with packing intact and minimal bleeding.  No progression of the erythema.   Neurological:     Mental Status: He is alert.    Lab Results:  Recent Labs    07/18/21 1221  WBC 13.4*  HGB 11.3*  HCT 34.2*  PLT 218   BMET Recent Labs    07/18/21 1221  NA 136  K 3.4*  CL 107  CO2 20*  GLUCOSE 234*  BUN 48*  CREATININE 1.60*  CALCIUM 9.0    PT/INR No results for input(s): LABPROT, INR in the last 72 hours. ABG No results for input(s): PHART, HCO3 in the last 72 hours.  Invalid input(s): PCO2, PO2  Studies/Results: CT ABDOMEN PELVIS W CONTRAST  Result Date: 07/18/2021 CLINICAL DATA:  Abdominal pain, groin swelling, hernia suspected, history of bladder cancer EXAM: CT ABDOMEN AND PELVIS WITH CONTRAST TECHNIQUE: Multidetector CT imaging of the abdomen and pelvis was performed using the standard protocol following bolus administration of intravenous contrast. CONTRAST:  56mL OMNIPAQUE IOHEXOL 350 MG/ML SOLN COMPARISON:  01/12/2021 FINDINGS: Lower chest: No acute abnormality.  Small hiatal hernia. Hepatobiliary: No solid liver abnormality is seen. No gallstones, gallbladder wall thickening, or biliary dilatation. Pancreas: Unremarkable. No pancreatic ductal dilatation or surrounding inflammatory changes. Spleen: Normal in size without significant abnormality. Adrenals/Urinary Tract: Adrenal glands are unremarkable. Kidneys are normal, without renal calculi, solid lesion, or hydronephrosis. Bladder is unremarkable. Stomach/Bowel: Stomach is within normal limits. Appendix is not clearly visualized and may be surgically absent. No evidence of bowel wall thickening, distention, or inflammatory changes. Status post sigmoid colon resection and reanastomosis. Vascular/Lymphatic: Aortic atherosclerosis. No enlarged abdominal or pelvic lymph nodes. Reproductive: There is a rim enhancing, elongated fluid collection about the right aspect of the pubic mild and abutting the base of the penis, measuring 6.1 x 3.4 x 3.2 cm (series 6, image 82, series 2, image 84). Other: Small, fat containing bilateral inguinal hernias. Status post ventral mesh hernia repair. No abdominopelvic ascites. Musculoskeletal: No acute or significant osseous findings. IMPRESSION: 1. There is a rim enhancing, elongated fluid collection about the right aspect of the pubic mild and  abutting the base of the penis, measuring 6.1 x 3.4 x 3.2 cm. This is concerning for hematoma or abscess. The presence or absence of infection is not established by CT. 2. Small, fat containing bilateral inguinal hernias. 3. Status post sigmoid colon resection and reanastomosis. Aortic Atherosclerosis (ICD10-I70.0). Electronically Signed   By: Delanna Ahmadi M.D.   On: 07/18/2021 13:42     Assessment and Plan: Suprapubic stitch abscess doing well s/p I&D.   I plan to send him home today after he has been seen by the Wound team for consideration of Vac therapy.  LOS: 0 days    Irine Seal 07/19/2021 [phone] Patient ID: [name], male   DOB: 12-Aug-1946, 75 y.o.   MRN: [id number]

## 2021-07-19 NOTE — Consult Note (Signed)
[facility] Nurse Consult Note: Reason for Consult: Assess for possible NPWT. Dr. Jeffie Pollock in at the time of my visit and we discuss discharge plan.  Patient to be discharged with follow up in his office on Thursday. Patient is to call for an appointment.  Wound type: Surgical, infectious Pressure Injury POA: N/A Measurement: 5cm x 4.5cm x 5cm with 4.2cm undermining at 6 o'clock Wound bed:red, moist Drainage (amount, consistency, odor) small serosanguinous at most distal tip of packing removed, mostly serous  Periwound: intact, no erythema, mild edema , no warmth or induration Dressing procedure/placement/frequency: NS dampened roll gauze is used to loosely fill the dead space. Wound is filled to skin level. This is topped with dry gauze 4x4s and secured with Medipore tape.  While the wound is certainly appropriate for NPWT, it will also heal easily by secondary intention due to the richly perfused location and the overall state of health of the patient. Antibiotics are ordered by Dr. Jeffie Pollock and he is to see him in the office on Thursday.  If NPWT is desired, HHRN referral could be made for initiation of therapy. Two or three times a week NPWT dressing changes would be performed by [facility].   [facility] [place] nursing team will not follow, but will remain available to this patient, the nursing and medical teams.  Please re-consult if needed. Thank you for this consult Maudie Flakes, MSN, RN, Chancy Milroy, Arther Abbott  Pager# [phone]

## 2021-07-19 NOTE — Discharge Summary (Signed)
Physician Discharge Summary  Patient ID: Richard Austin MRN: [id number] DOB/AGE: [birth year] 75 y.o.  Admit date: 07/18/2021 Discharge date: 07/19/2021  Admission Diagnoses:  Abscess of groin, right  Discharge Diagnoses:  Principal Problem:   Abscess of groin, right Active Problems:   AKI (acute kidney injury) ([place])   Hyperglycemia   Past Medical History:  Diagnosis Date   Arthritis    Bladder tumor    Diverticulitis    Diverticulosis    Hypertension    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 2019   Pre-diabetes     Surgeries: Procedure(s): IRRIGATION AND DEBRIDEMENT ABSCESS on 07/18/2021   Consultants (if any): Treatment Team:  Irine Seal, MD  Discharged Condition: Improved  Hospital Course: Richard Austin is an 75 y.o. male who was admitted 07/18/2021 with a diagnosis of Abscess of groin, right and went to the operating room on 07/18/2021 and underwent the above named procedures.  He was found to have a 6cm abscess associated with a prolene suture remnant from an abdominal surgical procedure 14 years prior.  He had mild AKI and hyperglycemia associated with the infection. He did well postoperatively and was assessed by the wound care nurse prior to discharge.  He is felt to be a candidate for a wound vac but will need to have that arranged as an outpatient with home health.     He was given perioperative antibiotics:  Anti-infectives (From admission, onward)    Start     Dose/Rate Route Frequency Ordered Stop   07/19/21 0208  piperacillin-tazobactam (ZOSYN) 3.375 GM/50ML IVPB       Note to Pharmacy: Nicholes Rough   : cabinet override      07/19/21 0208 07/19/21 0219   07/19/21 0200  piperacillin-tazobactam (ZOSYN) IVPB 3.375 g  Status:  Discontinued        3.375 g 12.5 mL/hr over 240 Minutes Intravenous Every 8 hours 07/18/21 2042 07/19/21 1431   07/19/21 0000  cefdinir (OMNICEF) 300 MG capsule        300 mg Oral 2 times daily 07/19/21 0713     07/18/21 1800   piperacillin-tazobactam (ZOSYN) IVPB 3.375 g       Note to Pharmacy: Need this stat as he will be going to the OR soon.   He will probably need to remain on this med in the post op period as well.   3.375 g 100 mL/hr over 30 Minutes Intravenous  Once 07/18/21 1756 07/18/21 1852   07/18/21 1430  vancomycin (VANCOREADY) IVPB 1500 mg/300 mL        1,500 mg 150 mL/hr over 120 Minutes Intravenous  Once 07/18/21 1417 07/18/21 1753   07/18/21 1430  cefTRIAXone (ROCEPHIN) 1 g in sodium chloride 0.9 % 100 mL IVPB        1 g 200 mL/hr over 30 Minutes Intravenous  Once 07/18/21 1424 07/18/21 1508     .  He was given sequential compression devices for DVT prophylaxis.  He benefited maximally from the hospital stay and there were no complications.    Recent vital signs:  Vitals:   07/19/21 0400 07/19/21 0700  BP: 96/65 115/75  Pulse: 74 79  Resp: 16 16  Temp: 98 F (36.7 C) 98.3 F (36.8 C)  SpO2: 93% 96%    Recent laboratory studies:  Lab Results  Component Value Date   HGB 11.3 (L) 07/18/2021   HGB 14.0 08/14/2018   HGB 11.8 (L) 07/08/2018   Lab Results  Component Value Date  WBC 13.4 (H) 07/18/2021   PLT 218 07/18/2021   No results found for: INR Lab Results  Component Value Date   NA 136 07/18/2021   K 3.4 (L) 07/18/2021   CL 107 07/18/2021   CO2 20 (L) 07/18/2021   BUN 48 (H) 07/18/2021   CREATININE 1.60 (H) 07/18/2021   GLUCOSE 234 (H) 07/18/2021    Discharge Medications:   Allergies as of 07/19/2021       Reactions   Amlodipine Other (See Comments)   Muscle tightness, fatigue    Bystolic [nebivolol Hcl] Other (See Comments)   Muscle tightness and fatigue    Statins Other (See Comments)   Muscle tightness and fatigue    Latex Rash        Medication List     TAKE these medications    cefdinir 300 MG capsule Commonly known as: OMNICEF Take 1 capsule (300 mg total) by mouth 2 (two) times daily.   chlorthalidone 25 MG tablet Commonly known as:  HYGROTON Take 25 mg by mouth every morning.   fluticasone 50 MCG/ACT nasal spray Commonly known as: FLONASE Place 2 sprays into both nostrils 2 (two) times daily as needed for allergies or rhinitis.   hydrocortisone 2.5 % cream Apply 1 application topically 2 (two) times daily as needed (for itching).   ibuprofen 800 MG tablet Commonly known as: ADVIL Take 800 mg by mouth every morning.   Krill Oil 500 MG Caps Take 500 mg by mouth at bedtime.   lisinopril 40 MG tablet Commonly known as: ZESTRIL Take 40 mg by mouth every morning.   omeprazole 40 MG capsule Commonly known as: PRILOSEC Take 40 mg by mouth See admin instructions. Take one capsule (40 mg) by mouth every other night   Refresh 1.4-0.6 % Soln Generic drug: Polyvinyl Alcohol-Povidone PF Place 1 drop into both eyes daily as needed (dry eyes).   tadalafil 20 MG tablet Commonly known as: CIALIS Take 20 mg by mouth once as needed (very high blood pressure with fluctuations).   tamsulosin 0.4 MG Caps capsule Commonly known as: FLOMAX Take 0.4 mg by mouth at bedtime.   TURMERIC PO Take 1,000 mg by mouth every morning.        Diagnostic Studies: CT ABDOMEN PELVIS W CONTRAST  Result Date: 07/18/2021 CLINICAL DATA:  Abdominal pain, groin swelling, hernia suspected, history of bladder cancer EXAM: CT ABDOMEN AND PELVIS WITH CONTRAST TECHNIQUE: Multidetector CT imaging of the abdomen and pelvis was performed using the standard protocol following bolus administration of intravenous contrast. CONTRAST:  52mL OMNIPAQUE IOHEXOL 350 MG/ML SOLN COMPARISON:  01/12/2021 FINDINGS: Lower chest: No acute abnormality.  Small hiatal hernia. Hepatobiliary: No solid liver abnormality is seen. No gallstones, gallbladder wall thickening, or biliary dilatation. Pancreas: Unremarkable. No pancreatic ductal dilatation or surrounding inflammatory changes. Spleen: Normal in size without significant abnormality. Adrenals/Urinary Tract: Adrenal  glands are unremarkable. Kidneys are normal, without renal calculi, solid lesion, or hydronephrosis. Bladder is unremarkable. Stomach/Bowel: Stomach is within normal limits. Appendix is not clearly visualized and may be surgically absent. No evidence of bowel wall thickening, distention, or inflammatory changes. Status post sigmoid colon resection and reanastomosis. Vascular/Lymphatic: Aortic atherosclerosis. No enlarged abdominal or pelvic lymph nodes. Reproductive: There is a rim enhancing, elongated fluid collection about the right aspect of the pubic mild and abutting the base of the penis, measuring 6.1 x 3.4 x 3.2 cm (series 6, image 82, series 2, image 84). Other: Small, fat containing bilateral inguinal hernias. Status post  ventral mesh hernia repair. No abdominopelvic ascites. Musculoskeletal: No acute or significant osseous findings. IMPRESSION: 1. There is a rim enhancing, elongated fluid collection about the right aspect of the pubic mild and abutting the base of the penis, measuring 6.1 x 3.4 x 3.2 cm. This is concerning for hematoma or abscess. The presence or absence of infection is not established by CT. 2. Small, fat containing bilateral inguinal hernias. 3. Status post sigmoid colon resection and reanastomosis. Aortic Atherosclerosis (ICD10-I70.0). Electronically Signed   By: Delanna Ahmadi M.D.   On: 07/18/2021 13:42    Disposition: Discharge disposition: 01-Home or Self Care      Discharge Instructions     Discharge patient   Complete by: As directed    After he has been seen by the wound team.   Discharge disposition: 01-Home or Self Care   Discharge patient date: 07/19/2021        Follow-up Information     [facility] Follow up.   Why: the office will call to arrange a f/u time.  I have asked them to get you in on thursday 11/10. Contact information: [address] [place]                  Signed: Irine Seal 07/19/2021, 8:07 PM

## 2021-07-21 DIAGNOSIS — L02214 Cutaneous abscess of groin: Secondary | ICD-10-CM | POA: Diagnosis not present

## 2021-07-23 LAB — AEROBIC/ANAEROBIC CULTURE W GRAM STAIN (SURGICAL/DEEP WOUND): Gram Stain: NONE SEEN

## 2021-07-25 DIAGNOSIS — L02214 Cutaneous abscess of groin: Secondary | ICD-10-CM | POA: Diagnosis not present

## 2021-07-29 DIAGNOSIS — L02214 Cutaneous abscess of groin: Secondary | ICD-10-CM | POA: Diagnosis not present

## 2021-08-03 DIAGNOSIS — L02214 Cutaneous abscess of groin: Secondary | ICD-10-CM | POA: Diagnosis not present

## 2021-08-08 DIAGNOSIS — L02214 Cutaneous abscess of groin: Secondary | ICD-10-CM | POA: Diagnosis not present

## 2021-08-10 DIAGNOSIS — L02214 Cutaneous abscess of groin: Secondary | ICD-10-CM | POA: Diagnosis not present

## 2021-08-10 DIAGNOSIS — M1990 Unspecified osteoarthritis, unspecified site: Secondary | ICD-10-CM | POA: Diagnosis not present

## 2021-08-10 DIAGNOSIS — Z87891 Personal history of nicotine dependence: Secondary | ICD-10-CM | POA: Diagnosis not present

## 2021-08-10 DIAGNOSIS — K4020 Bilateral inguinal hernia, without obstruction or gangrene, not specified as recurrent: Secondary | ICD-10-CM | POA: Diagnosis not present

## 2021-08-10 DIAGNOSIS — I700 Atherosclerosis of aorta: Secondary | ICD-10-CM | POA: Diagnosis not present

## 2021-08-10 DIAGNOSIS — Z8551 Personal history of malignant neoplasm of bladder: Secondary | ICD-10-CM | POA: Diagnosis not present

## 2021-08-10 DIAGNOSIS — I10 Essential (primary) hypertension: Secondary | ICD-10-CM | POA: Diagnosis not present

## 2021-08-10 DIAGNOSIS — R7303 Prediabetes: Secondary | ICD-10-CM | POA: Diagnosis not present

## 2021-08-10 DIAGNOSIS — K449 Diaphragmatic hernia without obstruction or gangrene: Secondary | ICD-10-CM | POA: Diagnosis not present

## 2021-08-10 DIAGNOSIS — Z791 Long term (current) use of non-steroidal anti-inflammatories (NSAID): Secondary | ICD-10-CM | POA: Diagnosis not present

## 2021-08-19 DIAGNOSIS — G5603 Carpal tunnel syndrome, bilateral upper limbs: Secondary | ICD-10-CM | POA: Diagnosis not present

## 2021-08-31 DIAGNOSIS — H524 Presbyopia: Secondary | ICD-10-CM | POA: Diagnosis not present

## 2021-09-09 DIAGNOSIS — L02214 Cutaneous abscess of groin: Secondary | ICD-10-CM | POA: Diagnosis not present

## 2021-09-09 DIAGNOSIS — Z87891 Personal history of nicotine dependence: Secondary | ICD-10-CM | POA: Diagnosis not present

## 2021-09-09 DIAGNOSIS — K4020 Bilateral inguinal hernia, without obstruction or gangrene, not specified as recurrent: Secondary | ICD-10-CM | POA: Diagnosis not present

## 2021-09-09 DIAGNOSIS — I10 Essential (primary) hypertension: Secondary | ICD-10-CM | POA: Diagnosis not present

## 2021-09-09 DIAGNOSIS — M1990 Unspecified osteoarthritis, unspecified site: Secondary | ICD-10-CM | POA: Diagnosis not present

## 2021-09-09 DIAGNOSIS — Z8551 Personal history of malignant neoplasm of bladder: Secondary | ICD-10-CM | POA: Diagnosis not present

## 2021-09-09 DIAGNOSIS — I700 Atherosclerosis of aorta: Secondary | ICD-10-CM | POA: Diagnosis not present

## 2021-09-09 DIAGNOSIS — R7303 Prediabetes: Secondary | ICD-10-CM | POA: Diagnosis not present

## 2021-09-09 DIAGNOSIS — K449 Diaphragmatic hernia without obstruction or gangrene: Secondary | ICD-10-CM | POA: Diagnosis not present

## 2021-09-09 DIAGNOSIS — Z791 Long term (current) use of non-steroidal anti-inflammatories (NSAID): Secondary | ICD-10-CM | POA: Diagnosis not present

## 2021-09-11 DIAGNOSIS — I639 Cerebral infarction, unspecified: Secondary | ICD-10-CM

## 2021-09-11 HISTORY — DX: Cerebral infarction, unspecified: I63.9

## 2021-09-19 DIAGNOSIS — M7542 Impingement syndrome of left shoulder: Secondary | ICD-10-CM | POA: Diagnosis not present

## 2021-09-19 DIAGNOSIS — M25512 Pain in left shoulder: Secondary | ICD-10-CM | POA: Diagnosis not present

## 2021-09-19 DIAGNOSIS — S4992XA Unspecified injury of left shoulder and upper arm, initial encounter: Secondary | ICD-10-CM | POA: Insufficient documentation

## 2021-09-20 DIAGNOSIS — G5602 Carpal tunnel syndrome, left upper limb: Secondary | ICD-10-CM | POA: Diagnosis not present

## 2021-09-20 HISTORY — PX: CARPAL TUNNEL RELEASE: SHX101

## 2021-09-21 ENCOUNTER — Emergency Department (HOSPITAL_COMMUNITY): Payer: Medicare Other

## 2021-09-21 ENCOUNTER — Inpatient Hospital Stay (HOSPITAL_COMMUNITY)
Admission: EM | Admit: 2021-09-21 | Discharge: 2021-09-23 | DRG: 061 | Disposition: A | Discharge status: Home / Self Care | Payer: Medicare Other | Attending: Neurology | Admitting: Neurology

## 2021-09-21 DIAGNOSIS — E7800 Pure hypercholesterolemia, unspecified: Secondary | ICD-10-CM | POA: Diagnosis not present

## 2021-09-21 DIAGNOSIS — Z20822 Contact with and (suspected) exposure to covid-19: Secondary | ICD-10-CM | POA: Diagnosis not present

## 2021-09-21 DIAGNOSIS — Z87891 Personal history of nicotine dependence: Secondary | ICD-10-CM | POA: Diagnosis not present

## 2021-09-21 DIAGNOSIS — R29702 NIHSS score 2: Secondary | ICD-10-CM | POA: Diagnosis not present

## 2021-09-21 DIAGNOSIS — I160 Hypertensive urgency: Secondary | ICD-10-CM | POA: Diagnosis not present

## 2021-09-21 DIAGNOSIS — G319 Degenerative disease of nervous system, unspecified: Secondary | ICD-10-CM | POA: Diagnosis not present

## 2021-09-21 DIAGNOSIS — I6389 Other cerebral infarction: Secondary | ICD-10-CM | POA: Diagnosis not present

## 2021-09-21 DIAGNOSIS — I618 Other nontraumatic intracerebral hemorrhage: Secondary | ICD-10-CM | POA: Diagnosis not present

## 2021-09-21 DIAGNOSIS — G8194 Hemiplegia, unspecified affecting left nondominant side: Secondary | ICD-10-CM | POA: Diagnosis present

## 2021-09-21 DIAGNOSIS — R1310 Dysphagia, unspecified: Secondary | ICD-10-CM | POA: Diagnosis present

## 2021-09-21 DIAGNOSIS — I672 Cerebral atherosclerosis: Secondary | ICD-10-CM | POA: Diagnosis not present

## 2021-09-21 DIAGNOSIS — I63511 Cerebral infarction due to unspecified occlusion or stenosis of right middle cerebral artery: Principal | ICD-10-CM | POA: Diagnosis present

## 2021-09-21 DIAGNOSIS — R7303 Prediabetes: Secondary | ICD-10-CM | POA: Diagnosis not present

## 2021-09-21 DIAGNOSIS — Z8052 Family history of malignant neoplasm of bladder: Secondary | ICD-10-CM | POA: Diagnosis not present

## 2021-09-21 DIAGNOSIS — I10 Essential (primary) hypertension: Secondary | ICD-10-CM | POA: Diagnosis not present

## 2021-09-21 DIAGNOSIS — G5602 Carpal tunnel syndrome, left upper limb: Secondary | ICD-10-CM | POA: Diagnosis not present

## 2021-09-21 DIAGNOSIS — I639 Cerebral infarction, unspecified: Secondary | ICD-10-CM | POA: Diagnosis present

## 2021-09-21 DIAGNOSIS — G936 Cerebral edema: Secondary | ICD-10-CM | POA: Diagnosis present

## 2021-09-21 DIAGNOSIS — R4781 Slurred speech: Secondary | ICD-10-CM | POA: Diagnosis present

## 2021-09-21 DIAGNOSIS — R29810 Facial weakness: Secondary | ICD-10-CM | POA: Diagnosis not present

## 2021-09-21 DIAGNOSIS — I619 Nontraumatic intracerebral hemorrhage, unspecified: Secondary | ICD-10-CM | POA: Diagnosis not present

## 2021-09-21 DIAGNOSIS — E785 Hyperlipidemia, unspecified: Secondary | ICD-10-CM | POA: Diagnosis present

## 2021-09-21 DIAGNOSIS — R609 Edema, unspecified: Secondary | ICD-10-CM | POA: Diagnosis not present

## 2021-09-21 DIAGNOSIS — I771 Stricture of artery: Secondary | ICD-10-CM | POA: Diagnosis not present

## 2021-09-21 DIAGNOSIS — Z8673 Personal history of transient ischemic attack (TIA), and cerebral infarction without residual deficits: Secondary | ICD-10-CM | POA: Diagnosis not present

## 2021-09-21 DIAGNOSIS — R531 Weakness: Secondary | ICD-10-CM | POA: Diagnosis present

## 2021-09-21 DIAGNOSIS — Z79899 Other long term (current) drug therapy: Secondary | ICD-10-CM | POA: Diagnosis not present

## 2021-09-21 DIAGNOSIS — Z808 Family history of malignant neoplasm of other organs or systems: Secondary | ICD-10-CM | POA: Diagnosis not present

## 2021-09-21 LAB — DIFFERENTIAL
Abs Immature Granulocytes: 0.06 10*3/uL (ref 0.00–0.07)
Basophils Absolute: 0 10*3/uL (ref 0.0–0.1)
Basophils Relative: 0 %
Eosinophils Absolute: 0.2 10*3/uL (ref 0.0–0.5)
Eosinophils Relative: 2 %
Immature Granulocytes: 1 %
Lymphocytes Relative: 8 %
Lymphs Abs: 1 10*3/uL (ref 0.7–4.0)
Monocytes Absolute: 0.7 10*3/uL (ref 0.1–1.0)
Monocytes Relative: 6 %
Neutro Abs: 10.1 10*3/uL — ABNORMAL HIGH (ref 1.7–7.7)
Neutrophils Relative %: 83 %

## 2021-09-21 LAB — RESP PANEL BY RT-PCR (FLU A&B, COVID) ARPGX2
Influenza A by PCR: NEGATIVE
Influenza B by PCR: NEGATIVE
SARS Coronavirus 2 by RT PCR: NEGATIVE

## 2021-09-21 LAB — CBC
HCT: 36.1 % — ABNORMAL LOW (ref 39.0–52.0)
Hemoglobin: 11.8 g/dL — ABNORMAL LOW (ref 13.0–17.0)
MCH: 29.1 pg (ref 26.0–34.0)
MCHC: 32.7 g/dL (ref 30.0–36.0)
MCV: 89.1 fL (ref 80.0–100.0)
Platelets: 299 10*3/uL (ref 150–400)
RBC: 4.05 MIL/uL — ABNORMAL LOW (ref 4.22–5.81)
RDW: 13.5 % (ref 11.5–15.5)
WBC: 12.1 10*3/uL — ABNORMAL HIGH (ref 4.0–10.5)
nRBC: 0 % (ref 0.0–0.2)

## 2021-09-21 LAB — COMPREHENSIVE METABOLIC PANEL
ALT: 24 U/L (ref 0–44)
AST: 22 U/L (ref 15–41)
Albumin: 3.5 g/dL (ref 3.5–5.0)
Alkaline Phosphatase: 87 U/L (ref 38–126)
Anion gap: 12 (ref 5–15)
BUN: 21 mg/dL (ref 8–23)
CO2: 22 mmol/L (ref 22–32)
Calcium: 9.3 mg/dL (ref 8.9–10.3)
Chloride: 103 mmol/L (ref 98–111)
Creatinine, Ser: 1.11 mg/dL (ref 0.61–1.24)
GFR, Estimated: >60 mL/min (ref >60)
Glucose, Bld: 175 mg/dL — ABNORMAL HIGH (ref 70–99)
Potassium: 3.6 mmol/L (ref 3.5–5.1)
Sodium: 137 mmol/L (ref 135–145)
Total Bilirubin: 0.4 mg/dL (ref 0.3–1.2)
Total Protein: 6.5 g/dL (ref 6.5–8.1)

## 2021-09-21 LAB — APTT: aPTT: 29 seconds (ref 24–36)

## 2021-09-21 LAB — I-STAT CHEM 8, ED
BUN: 23 mg/dL (ref 8–23)
Calcium, Ion: 1.13 mmol/L — ABNORMAL LOW (ref 1.15–1.40)
Chloride: 106 mmol/L (ref 98–111)
Creatinine, Ser: 1.1 mg/dL (ref 0.61–1.24)
Glucose, Bld: 174 mg/dL — ABNORMAL HIGH (ref 70–99)
HCT: 37 % — ABNORMAL LOW (ref 39.0–52.0)
Hemoglobin: 12.6 g/dL — ABNORMAL LOW (ref 13.0–17.0)
Potassium: 3.6 mmol/L (ref 3.5–5.1)
Sodium: 137 mmol/L (ref 135–145)
TCO2: 21 mmol/L — ABNORMAL LOW (ref 22–32)

## 2021-09-21 LAB — PROTIME-INR
INR: 0.9 (ref 0.8–1.2)
Prothrombin Time: 12 seconds (ref 11.4–15.2)

## 2021-09-21 LAB — CBG MONITORING, ED: Glucose-Capillary: 163 mg/dL — ABNORMAL HIGH (ref 70–99)

## 2021-09-21 MED ORDER — TENECTEPLASE FOR STROKE
0.2500 mg/kg | PACK | Freq: Once | INTRAVENOUS | Status: AC
  Administered 2021-09-21: 20 mg via INTRAVENOUS
  Filled 2021-09-21: qty 10

## 2021-09-21 MED ORDER — ACETAMINOPHEN 160 MG/5ML PO SOLN: 650.0000 mg | ORAL | Status: DC | PRN

## 2021-09-21 MED ORDER — SODIUM CHLORIDE 0.9 % IV SOLN: INTRAVENOUS | Status: DC

## 2021-09-21 MED ORDER — STROKE: EARLY STAGES OF RECOVERY BOOK: Freq: Once | Status: AC

## 2021-09-21 MED ORDER — SENNOSIDES-DOCUSATE SODIUM 8.6-50 MG PO TABS: 1.0000 | ORAL_TABLET | Freq: Every evening | ORAL | Status: DC | PRN

## 2021-09-21 MED ORDER — CHLORHEXIDINE GLUCONATE CLOTH 2 % EX PADS
6.0000 | MEDICATED_PAD | Freq: Every day | CUTANEOUS | Status: DC
  Administered 2021-09-22 – 2021-09-23 (×2): 6 via TOPICAL

## 2021-09-21 MED ORDER — LABETALOL HCL 5 MG/ML IV SOLN
INTRAVENOUS | Status: AC
  Filled 2021-09-21: qty 4

## 2021-09-21 MED ORDER — ACETAMINOPHEN 325 MG PO TABS: 650.0000 mg | ORAL_TABLET | ORAL | Status: DC | PRN

## 2021-09-21 MED ORDER — IOHEXOL 350 MG/ML SOLN
100.0000 mL | Freq: Once | INTRAVENOUS | Status: AC | PRN
  Administered 2021-09-21: 100 mL via INTRAVENOUS

## 2021-09-21 MED ORDER — SODIUM CHLORIDE 0.9% FLUSH
3.0000 mL | Freq: Once | INTRAVENOUS | Status: AC
  Administered 2021-09-21: 3 mL via INTRAVENOUS

## 2021-09-21 MED ORDER — LABETALOL HCL 5 MG/ML IV SOLN
20.0000 mg | Freq: Once | INTRAVENOUS | Status: AC
  Administered 2021-09-21: 20 mg via INTRAVENOUS
  Filled 2021-09-21: qty 4

## 2021-09-21 MED ORDER — PANTOPRAZOLE SODIUM 40 MG IV SOLR
40.0000 mg | Freq: Every day | INTRAVENOUS | Status: DC
  Administered 2021-09-21: 40 mg via INTRAVENOUS
  Filled 2021-09-21: qty 40

## 2021-09-21 MED ORDER — CLEVIDIPINE BUTYRATE 0.5 MG/ML IV EMUL
0.0000 mg/h | INTRAVENOUS | Status: DC
  Administered 2021-09-21: 2 mg/h via INTRAVENOUS
  Filled 2021-09-21: qty 50

## 2021-09-21 MED ORDER — ACETAMINOPHEN 650 MG RE SUPP: 650.0000 mg | RECTAL | Status: DC | PRN

## 2021-09-21 MED ORDER — HYDROCODONE-ACETAMINOPHEN 5-325 MG PO TABS
1.0000 | ORAL_TABLET | ORAL | Status: DC | PRN
  Administered 2021-09-21 – 2021-09-22 (×2): 1 via ORAL
  Filled 2021-09-21 (×2): qty 1

## 2021-09-21 NOTE — Progress Notes (Signed)
Was called to speak with the patient's wife who arrived afterwards and answer questions.  She reports more swelling in the surgical hand on the left.  Extremely concerned about this. While I was in the room, she spoke with the [facility] orthopedic surgeon who will evaluate the patient tomorrow. I have ordered hydrocodone every 4 hours as needed for pain. Appreciate orthopedic consultation.  No x-rays advised for now. Pain management, limb elevation recommended. Have answered all the questions that the wife had.   -- Amie Portland, MD Neurologist [facility] Pager: [phone]

## 2021-09-21 NOTE — ED Triage Notes (Signed)
Pt BIB [facility] for left leg -weakness, numb thumb and index finger with a left sided facial droop. Pt had prior weakness in his left shoulder due to an injury   97% RA  CBG 191 HR 81  171/85

## 2021-09-21 NOTE — Progress Notes (Signed)
PHARMACIST CODE STROKE RESPONSE  Notified to mix TNK at 1932 by Dr. Rory Percy Delivered TNK to RN at 1937  TNK dose = 20 mg IV over 5 seconds  Issues/delays encountered (if applicable): none  Thank you for involving pharmacy in this patient's care.  Elita Quick, PharmD PGY1 Ambulatory Care Pharmacy Resident 09/21/2021 7:41 PM  **Pharmacist phone directory can be found on [email or web address] listed under [place]**

## 2021-09-21 NOTE — Code Documentation (Signed)
Responded to Code Stroke called at 1904 for L facial droop and L sided weakness, [id number]. Pt arrived at 1916, NIH-2 for L facial droop and slurred speech, CT head negative for acute changes. TNK given at 1937. CTA-no LVO. Plan to admit to ICU.

## 2021-09-21 NOTE — H&P (Signed)
Neurology Consultation  Reason for Consult: Left-sided weakness, slurred speech Referring Physician: Dr. Davonna Belling  CC: Left-sided weakness, slurred speech  History is obtained from: Patient, chart  HPI: Richard Austin is a 76 y.o. male past medical history of arthritis, hypertension, prediabetes, recent carpal tunnel surgery on the left wrist, presenting for evaluation of sudden onset of left facial weakness, left-sided numbness and slurred speech.  He reports being in usual state of health, he was eating a bread pudding and suddenly noted that the left face and arm feels numb.  Then he started noticing weakness-noticed that his left leg gave out.  EMS was called.  On their evaluation he had subtle left-sided weakness, left facial droop and slurred speech for which code stroke was activated. He was seen and evaluated at the bridge-NIH 2. Noncontrast head CT done and personally reviewed-no acute changes.  Aspects 10.  No bleed. Risks and benefits of IV TNKase in the setting of recent wrist surgery were discussed in detail.  After thorough discussion of risks and benefits, patient decided to proceed with IV TNKase administration which was administered at 1937 hrs. Has a history of recent fall with injury to the left shoulder and was scheduled for an outpatient MRI.  No current pain but restricted mobility of the left shoulder noted.  LKW: 5 PM today tpa given?:  Yes Premorbid modified Rankin scale (mRS): 1-mainly due to arthritis   ROS: Full ROS was performed and is negative except as noted in the HPI.   Past Medical History:  Diagnosis Date   Arthritis    Bladder tumor    Diverticulitis    Diverticulosis    Hypertension    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 2019   Pre-diabetes         Family History  Problem Relation Age of Onset   Peptic Ulcer Mother    Cirrhosis Mother    Alcohol abuse Mother    Bladder Cancer Father    Throat cancer Maternal Grandfather     Social History:   reports that he has quit smoking. His smoking use included cigarettes. He has a 45.00 pack-year smoking history. He has never used smokeless tobacco. He reports current alcohol use. He reports that he does not currently use drugs.  Medications  Current Facility-Administered Medications:     stroke: mapping our early stages of recovery book, , Does not apply, Once, Amie Portland, MD   0.9 %  sodium chloride infusion, , Intravenous, Continuous, Amie Portland, MD   acetaminophen (TYLENOL) tablet 650 mg, 650 mg, Oral, Q4H PRN **OR** acetaminophen (TYLENOL) 160 MG/5ML solution 650 mg, 650 mg, Per Tube, Q4H PRN **OR** acetaminophen (TYLENOL) suppository 650 mg, 650 mg, Rectal, Q4H PRN, Amie Portland, MD   labetalol (NORMODYNE) injection 20 mg, 20 mg, Intravenous, Once **AND** clevidipine (CLEVIPREX) infusion 0.5 mg/mL, 0-21 mg/hr, Intravenous, Continuous, Amie Portland, MD   pantoprazole (PROTONIX) injection 40 mg, 40 mg, Intravenous, QHS, Amie Portland, MD   senna-docusate (Senokot-S) tablet 1 tablet, 1 tablet, Oral, QHS PRN, Amie Portland, MD   sodium chloride flush (NS) 0.9 % injection 3 mL, 3 mL, Intravenous, Once, Davonna Belling, MD  Current Outpatient Medications:    cefdinir (OMNICEF) 300 MG capsule, Take 1 capsule (300 mg total) by mouth 2 (two) times daily., Disp: 10 capsule, Rfl: 0   chlorthalidone (HYGROTON) 25 MG tablet, Take 25 mg by mouth every morning., Disp: , Rfl:    fluticasone (FLONASE) 50 MCG/ACT nasal spray, Place 2 sprays  into both nostrils 2 (two) times daily as needed for allergies or rhinitis. , Disp: , Rfl:    hydrocortisone 2.5 % cream, Apply 1 application topically 2 (two) times daily as needed (for itching). , Disp: , Rfl:    ibuprofen (ADVIL) 800 MG tablet, Take 800 mg by mouth every morning., Disp: , Rfl:    Krill Oil 500 MG CAPS, Take 500 mg by mouth at bedtime., Disp: , Rfl:    lisinopril (PRINIVIL,ZESTRIL) 40 MG tablet, Take 40 mg by mouth  every morning., Disp: , Rfl:    omeprazole (PRILOSEC) 40 MG capsule, Take 40 mg by mouth See admin instructions. Take one capsule (40 mg) by mouth every other night, Disp: , Rfl:    Polyvinyl Alcohol-Povidone PF (REFRESH) 1.4-0.6 % SOLN, Place 1 drop into both eyes daily as needed (dry eyes)., Disp: , Rfl:    tadalafil (CIALIS) 20 MG tablet, Take 20 mg by mouth once as needed (very high blood pressure with fluctuations)., Disp: , Rfl:    tamsulosin (FLOMAX) 0.4 MG CAPS capsule, Take 0.4 mg by mouth at bedtime., Disp: , Rfl:    TURMERIC PO, Take 1,000 mg by mouth every morning., Disp: , Rfl:    Exam: Current vital signs: BP (!) 164/78    Pulse 89    Temp 98.4 F (36.9 C) (Oral)    Resp 15    Wt 81.2 kg    SpO2 100%    BMI 27.22 kg/m  Vital signs in last 24 hours: Temp:  [98.4 F (36.9 C)] 98.4 F (36.9 C) (01/11 1953) Pulse Rate:  [81-92] 89 (01/11 2000) Resp:  [15-22] 15 (01/11 2000) BP: (147-166)/(76-105) 164/78 (01/11 2000) SpO2:  [96 %-100 %] 100 % (01/11 2000) Weight:  [81.2 kg] 81.2 kg (01/11 1925)  General: Awake alert in no distress HEENT: Normocephalic and atraumatic, dry Eliquis membranes Lungs clear to auscultation CVs: Regular rate rhythm, S1-S2 heard Abdomen soft nondistended nontender Extremities: Left wrist in bandage with swelling over the hand.  Left shoulder not tender but has restricted range of motion.  Normal on the right. Neurological exam Awake alert oriented x3 Mild dysarthria No evidence of aphasia Cranial nerves II to XII with the exception of mild left lower facial weakness, intact. Motor examination with limited range of motion of the left shoulder but upon passively lifting the left arm, no drift noted.  No drift in the left leg.  No drift in the right arm or leg which are also full strength. Sensation intact to touch on the face arm and leg without extinction Coordination with no dysmetria in the upper or lower extremities. NIH stroke  scale-2  Labs I have reviewed labs in epic and the results pertinent to this consultation are:   CBC    Component Value Date/Time   WBC 12.1 (H) 09/21/2021 1924   RBC 4.05 (L) 09/21/2021 1924   HGB 12.6 (L) 09/21/2021 1925   HCT 37.0 (L) 09/21/2021 1925   PLT 299 09/21/2021 1924   MCV 89.1 09/21/2021 1924   MCH 29.1 09/21/2021 1924   MCHC 32.7 09/21/2021 1924   RDW 13.5 09/21/2021 1924   LYMPHSABS 1.0 09/21/2021 1924   MONOABS 0.7 09/21/2021 1924   EOSABS 0.2 09/21/2021 1924   BASOSABS 0.0 09/21/2021 1924    CMP     Component Value Date/Time   NA 137 09/21/2021 1925   K 3.6 09/21/2021 1925   CL 106 09/21/2021 1925   CO2 20 (L) 07/18/2021 1221  GLUCOSE 174 (H) 09/21/2021 1925   BUN 23 09/21/2021 1925   CREATININE 1.10 09/21/2021 1925   CALCIUM 9.0 07/18/2021 1221   PROT 7.1 07/18/2021 1221   ALBUMIN 4.0 07/18/2021 1221   AST 14 (L) 07/18/2021 1221   ALT 15 07/18/2021 1221   ALKPHOS 73 07/18/2021 1221   BILITOT 0.7 07/18/2021 1221   GFRNONAA 45 (L) 07/18/2021 1221   GFRAA >60 08/14/2018 0811    Imaging I have reviewed the images obtained:  CT-head-aspects 10.  No bleed  Assessment:  77 year old with above past medical history with sudden onset of left-sided facial droop, slurred speech and left-sided numbness along with some left-sided weakness which is somewhat improved since it started. NIH stroke scale-2 as detailed above. Detailed discussion on risk and benefits of IV TNKase in the setting of recent carpal tunnel surgery on the left wrist.  He agreed to proceed with IV TNKase. Likely lacunar stroke. Clinical exam not consistent with LVO but I will obtain a CTA head and neck.  Plan:  Acute Ischemic Stroke Acuity: Acute Current Suspected Etiology: Small vessel disease Continue Evaluation:  -Admit to: Neuro ICU -Hold Aspirin until 24 hour post IV thrombolysis (tPA or TNKase) neuroimaging is stable and without evidence of bleeding -Blood pressure  control, goal of SYS <180 -MRI/ECHO/A1C/Lipid panel. -Hyperglycemia management per SSI to maintain glucose 140-180mg /dL. -PT/OT/ST therapies and recommendations when able  CNS -Close neuro monitoring  Dysarthria Dysphagia following cerebral infarction  -NPO until cleared by speech -ST -Advance diet as tolerated   Hemiplegia and hemiparesis following cerebral infarction affecting left non-dominant side  -PT/OT   RESP No active issues Monitor clinically  CV Came in with hypertensive urgency/systolic blood pressure in the 170s. Blood pressure goal less than 180-use Cleviprex gtt. and as needed labetalol and hydralazine -TTE  Hyperlipidemia, unspecified  - Statin for goal LDL < 70  HEME Check CBC   ENDO Check A1c Has a history of prediabetes Goal A1c less than 7  GI/GU Last BMP on from 09/21/2021 with normal creatinine. Check CMP in the morning  Fluid/Electrolyte Disorders Check CMP and replete electrolytes as necessary  ID Check CBC Chest x-ray if there is any respiratory complaints  Prophylaxis DVT: SCD GI: PPI Bowel: Docusate senna  Diet: NPO until cleared by bedside swallow evaluation  Code Status: Full Code    THE FOLLOWING WERE PRESENT ON ADMISSION: Acute ischemic stroke, hypertensive urgency  -- Amie Portland, MD Neurologist [facility] Pager: [phone]  CRITICAL CARE ATTESTATION Performed by: Amie Portland, MD Total critical care time: 35 minutes Critical care time was exclusive of separately billable procedures and treating other patients and/or supervising APPs/Residents/Students Critical care was necessary to treat or prevent imminent or life-threatening deterioration due to ischemic stroke, IV thrombolysis This patient is critically ill and at significant risk for neurological worsening and/or death and care requires constant monitoring. Critical care was time spent personally by me on the following activities:  development of treatment plan with patient and/or surrogate as well as nursing, discussions with consultants, evaluation of patient's response to treatment, examination of patient, obtaining history from patient or surrogate, ordering and performing treatments and interventions, ordering and review of laboratory studies, ordering and review of radiographic studies, pulse oximetry, re-evaluation of patient's condition, participation in multidisciplinary rounds and medical decision making of high complexity in the care of this patient.

## 2021-09-21 NOTE — ED Provider Notes (Signed)
[facility] EMERGENCY DEPARTMENT Provider Note   CSN: [id number] Arrival date & time: 09/21/21  1916  An emergency department physician performed an initial assessment on this suspected stroke patient at 61.  History  Chief Complaint  Patient presents with   Code Stroke    Richard Austin is a 76 y.o. male.  HPI Patient presents as a code stroke.  Last normal at 5:00 tonight.  States later he got up after sitting down.  States he was walking okay when his left leg gave out.  Notes he is also having trouble speaking.  Had left-sided facial droop.  Brought in by EMS as a code stroke.  Did have carpal tunnel surgery by Dr. Apolonio Schneiders yesterday.  History of hypertension. Patient has left shoulder pain.  Had a fall.  Had seen orthopedic surgery and reportedly has plans for an MRI.  They thought he may have torn her rotator cuff.  Does have some mild swelling in left hand.    Home Medications Prior to Admission medications   Medication Sig Start Date End Date Taking? Authorizing Provider  cefdinir (OMNICEF) 300 MG capsule Take 1 capsule (300 mg total) by mouth 2 (two) times daily. 07/19/21   Irine Seal, MD  chlorthalidone (HYGROTON) 25 MG tablet Take 25 mg by mouth every morning.    [provider]  fluticasone (FLONASE) 50 MCG/ACT nasal spray Place 2 sprays into both nostrils 2 (two) times daily as needed for allergies or rhinitis.     [provider]  hydrocortisone 2.5 % cream Apply 1 application topically 2 (two) times daily as needed (for itching).     [provider]  ibuprofen (ADVIL) 800 MG tablet Take 800 mg by mouth every morning. 04/10/21   [provider]  Javier Docker Oil 500 MG CAPS Take 500 mg by mouth at bedtime.    [provider]  lisinopril (PRINIVIL,ZESTRIL) 40 MG tablet Take 40 mg by mouth every morning.    [provider]  omeprazole (PRILOSEC) 40 MG capsule Take 40 mg by mouth See admin instructions.  Take one capsule (40 mg) by mouth every other night    [provider]  Polyvinyl Alcohol-Povidone PF (REFRESH) 1.4-0.6 % SOLN Place 1 drop into both eyes daily as needed (dry eyes).    [provider]  tadalafil (CIALIS) 20 MG tablet Take 20 mg by mouth once as needed (very high blood pressure with fluctuations).    [provider]  tamsulosin (FLOMAX) 0.4 MG CAPS capsule Take 0.4 mg by mouth at bedtime.    [provider]  TURMERIC PO Take 1,000 mg by mouth every morning.    [provider]      Allergies    Amlodipine, Bystolic [nebivolol hcl], Statins, and Latex    Review of Systems   Review of Systems  Respiratory:  Negative for shortness of breath.   Neurological:  Positive for speech difficulty.   Physical Exam Updated Vital Signs BP (!) 161/79 (BP Location: Right Arm)    Pulse 82    Temp 98.4 F (36.9 C) (Oral)    Resp (!) 22    Wt 81.2 kg    SpO2 100%    BMI 27.22 kg/m  Physical Exam Vitals and nursing note reviewed.  Cardiovascular:     Rate and Rhythm: Regular rhythm.  Pulmonary:     Breath sounds: No wheezing or rhonchi.  Abdominal:     Tenderness: There is no abdominal tenderness.  Musculoskeletal:        General: Tenderness present.     Cervical back: Neck supple.     Comments: Mild tenderness proximal shoulder.  Decreased active raising at the shoulder but able to keep it up with passive assistance initially.  Wrap on left hand.  Mild edema.  No active bleeding.    Skin:    General: Skin is warm.  Neurological:     Mental Status: He is alert and oriented to person, place, and time.     Comments: Eye movements grossly intact.  Equal forehead bilaterally.  Left-sided lower facial droop.  Mildly slurred speech.  Good straight leg raise bilaterally.  Good strength in lower extremities.  Complete NIH scoring done by neurology.  Awake and appropriate.    ED Results / Procedures / Treatments   Labs (all labs ordered are  listed, but only abnormal results are displayed) Labs Reviewed  CBC - Abnormal; Notable for the following components:      Result Value   WBC 12.1 (*)    RBC 4.05 (*)    Hemoglobin 11.8 (*)    HCT 36.1 (*)    All other components within normal limits  DIFFERENTIAL - Abnormal; Notable for the following components:   Neutro Abs 10.1 (*)    All other components within normal limits  I-STAT CHEM 8, ED - Abnormal; Notable for the following components:   Glucose, Bld 174 (*)    Calcium, Ion 1.13 (*)    TCO2 21 (*)    Hemoglobin 12.6 (*)    HCT 37.0 (*)    All other components within normal limits  CBG MONITORING, ED - Abnormal; Notable for the following components:   Glucose-Capillary 163 (*)    All other components within normal limits  RESP PANEL BY RT-PCR (FLU A&B, COVID) ARPGX2  PROTIME-INR  APTT  COMPREHENSIVE METABOLIC PANEL  HEMOGLOBIN A1C  LIPID PANEL    EKG None  Radiology CT HEAD CODE STROKE WO CONTRAST  Result Date: 09/21/2021 CLINICAL DATA:  Code stroke. Initial evaluation for acute neuro deficit, stroke suspected. EXAM: CT HEAD WITHOUT CONTRAST TECHNIQUE: Contiguous axial images were obtained from the base of the skull through the vertex without intravenous contrast. RADIATION DOSE REDUCTION: This exam was performed according to the departmental dose-optimization program which includes automated exposure control, adjustment of the mA and/or kV according to patient size and/or use of iterative reconstruction technique. COMPARISON:  None available. FINDINGS: Brain: Cerebral volume within normal limits for age. Chronic microvascular ischemic disease noted involving the supratentorial cerebral white matter. Multiple scattered superimposed remote lacunar infarcts present about the bilateral basal ganglia. Few small remote cerebellar infarcts noted. No acute intracranial hemorrhage. No acute large vessel territory infarct. No mass lesion or midline shift. No hydrocephalus or  extra-axial fluid collection. Vascular: No hyperdense vessel. Scattered vascular calcifications noted within the carotid siphons. Skull: Scalp soft tissues and calvarium within normal limits. Sinuses/Orbits: Prior ocular lens replacement on the left. Globes and orbital soft tissues demonstrate no acute finding. Scattered mucoperiosteal thickening present throughout the paranasal sinuses. No air-fluid levels to suggest acute sinusitis. No mastoid effusion. Other: None. ASPECTS [facility] Stroke Program Early CT Score) - Ganglionic level infarction (caudate, lentiform nuclei, internal capsule, insula, M1-M3 cortex): 7 - Supraganglionic infarction (M4-M6 cortex): 3 Total score (0-10 with 10 being normal): 10 IMPRESSION: 1. No acute intracranial abnormality. 2. ASPECTS is 10. 3. Chronic microvascular ischemic disease with multiple remote lacunar infarcts about the bilateral basal ganglia and cerebellum.  These results were communicated to Dr. Rory Percy at 7:38 pm on 09/21/2021 by text page via the [facility] messaging system. Electronically Signed   By: Jeannine Boga M.D.   On: 09/21/2021 19:39   CT ANGIO HEAD NECK W WO CM (CODE STROKE)  Result Date: 09/21/2021 CLINICAL DATA:  Initial evaluation for neuro deficit, stroke. EXAM: CT ANGIOGRAPHY HEAD AND NECK TECHNIQUE: Multidetector CT imaging of the head and neck was performed using the standard protocol during bolus administration of intravenous contrast. Multiplanar CT image reconstructions and MIPs were obtained to evaluate the vascular anatomy. Carotid stenosis measurements (when applicable) are obtained utilizing NASCET criteria, using the distal internal carotid diameter as the denominator. RADIATION DOSE REDUCTION: This exam was performed according to the departmental dose-optimization program which includes automated exposure control, adjustment of the mA and/or kV according to patient size and/or use of iterative reconstruction technique. CONTRAST:  153m  OMNIPAQUE IOHEXOL 350 MG/ML SOLN COMPARISON:  Head CT from earlier the same day. FINDINGS: CTA NECK FINDINGS Aortic arch: Visualized aortic arch normal in caliber with normal branch pattern. Moderate atheromatous change about the arch. No hemodynamically significant stenosis about the origin the great vessels. Right carotid system: Right CCA patent from its origin to the bifurcation without stenosis. Moderate calcified plaque about the right carotid bulb/proximal right ICA without hemodynamically significant stenosis. Right ICA tortuous but widely patent distally without stenosis or dissection. Left carotid system: Left CCA patent from its origin to the bifurcation without stenosis. Moderate atheromatous change about the left carotid bulb/proximal left ICA without hemodynamically significant stenosis. Left ICA tortuous but widely patent distally without stenosis or dissection. Vertebral arteries: Both vertebral arteries arise from the subclavian arteries. No proximal subclavian artery stenosis. Atheromatous change about the origins of both vertebral arteries with associated severe ostial stenosis on the right. Vertebral arteries mildly tortuous but otherwise patent distally without stenosis or dissection. Skeleton: Reversal of the normal cervical lordosis with advanced spondylosis at C4-5 through C6-7. No discrete or worrisome osseous lesions. Other neck: No other acute soft tissue abnormality within the neck. Upper chest: Visualized upper chest demonstrates no acute finding. Mildly prominent shotty subcentimeter mediastinal lymph nodes noted. Review of the MIP images confirms the above findings CTA HEAD FINDINGS Anterior circulation: Petrous segments patent bilaterally. Atheromatous change within the carotid siphons without hemodynamically significant stenosis. A1 segments patent bilaterally. Normal anterior communicating artery complex. Anterior cerebral arteries patent without stenosis. No M1 stenosis or  occlusion. Normal MCA bifurcations. No proximal MCA branch occlusion. To distal small vessel atheromatous irregularity. Posterior circulation: Both V4 segments patent without significant stenosis. Both PICA origins patent. Basilar patent to its distal aspect without stenosis. Superior cerebellar arteries patent bilaterally. Left PCA supplied via the basilar. Right PCA supplied via a hypoplastic right P1 segment and prominent right posterior communicating artery. With PCAs patent to their distal aspects without stenosis. Venous sinuses: Patent allowing for timing the contrast bolus. Anatomic variants: None significant.  No aneurysm. Review of the MIP images confirms the above findings IMPRESSION: 1. Negative CTA for emergent large vessel occlusion. 2. Atheromatous change about the origin of the right vertebral artery with associated severe ostial stenosis. 3. Moderate atherosclerotic change about the carotid bifurcations and carotid siphons without hemodynamically significant stenosis. 4. Diffuse tortuosity of the major arterial vasculature of the head and neck, suggesting chronic underlying hypertension. 5.  Aortic Atherosclerosis (ICD10-I70.0). Electronically Signed   By: BJeannine BogaM.D.   On: 09/21/2021 20:09    Procedures Procedures    Medications  Ordered in ED Medications   stroke: mapping our early stages of recovery book (has no administration in time range)  0.9 %  sodium chloride infusion (has no administration in time range)  acetaminophen (TYLENOL) tablet 650 mg (has no administration in time range)    Or  acetaminophen (TYLENOL) 160 MG/5ML solution 650 mg (has no administration in time range)    Or  acetaminophen (TYLENOL) suppository 650 mg (has no administration in time range)  senna-docusate (Senokot-S) tablet 1 tablet (has no administration in time range)  pantoprazole (PROTONIX) injection 40 mg (has no administration in time range)  labetalol (NORMODYNE) injection 20 mg  (has no administration in time range)    And  clevidipine (CLEVIPREX) infusion 0.5 mg/mL (has no administration in time range)  sodium chloride flush (NS) 0.9 % injection 3 mL (3 mLs Intravenous Given 09/21/21 2012)  tenecteplase (TNKASE) injection for Stroke 20 mg (20 mg Intravenous Given 09/21/21 1937)  iohexol (OMNIPAQUE) 350 MG/ML injection 100 mL (100 mLs Intravenous Contrast Given 09/21/21 1949)    ED Course/ Medical Decision Making/ A&P                           Medical Decision Making Amount and/or Complexity of Data Reviewed Labs: ordered. Radiology: independent interpretation performed.  Risk Decision regarding hospitalization.  Critical Care Total time providing critical care: 30-74 minutes  Patient came in as a code stroke.  Left-sided facial droop.  Also some numbness on the left face.  Had had a weakness in his left leg that had resolved.  Mild hypertension.  Does have recent surgery yesterday but it is at a compressible site.  Initial head CT reassuring.  Had been met at the bridge by Dr. Malen Gauze and night.  Will be given TN K.  Informed of risks.          Final Clinical Impression(s) / ED Diagnoses Final diagnoses:  Cerebrovascular accident (CVA), unspecified mechanism [facility])    Rx / [place] Orders ED Discharge Orders     None         Davonna Belling, MD 09/21/21 2020

## 2021-09-22 ENCOUNTER — Inpatient Hospital Stay (HOSPITAL_COMMUNITY): Payer: Medicare Other

## 2021-09-22 DIAGNOSIS — I6389 Other cerebral infarction: Secondary | ICD-10-CM | POA: Diagnosis not present

## 2021-09-22 DIAGNOSIS — I639 Cerebral infarction, unspecified: Secondary | ICD-10-CM

## 2021-09-22 DIAGNOSIS — E7800 Pure hypercholesterolemia, unspecified: Secondary | ICD-10-CM

## 2021-09-22 DIAGNOSIS — G5602 Carpal tunnel syndrome, left upper limb: Secondary | ICD-10-CM

## 2021-09-22 LAB — HEMOGLOBIN A1C
Hgb A1c MFr Bld: 6.4 % — ABNORMAL HIGH (ref 4.8–5.6)
Mean Plasma Glucose: 136.98 mg/dL

## 2021-09-22 LAB — LIPID PANEL
Cholesterol: 209 mg/dL — ABNORMAL HIGH (ref 0–200)
HDL: 35 mg/dL — ABNORMAL LOW (ref >40)
LDL Cholesterol: 137 mg/dL — ABNORMAL HIGH (ref 0–99)
Total CHOL/HDL Ratio: 6 RATIO
Triglycerides: 183 mg/dL — ABNORMAL HIGH (ref <150)
VLDL: 37 mg/dL (ref 0–40)

## 2021-09-22 LAB — ECHOCARDIOGRAM COMPLETE
AR max vel: 2.75 cm2
AV Peak grad: 10.5 mmHg
Ao pk vel: 1.62 m/s
Area-P 1/2: 2.8 cm2
P 1/2 time: 408 msec
S' Lateral: 3.2 cm
Weight: 2864.22 oz

## 2021-09-22 LAB — MRSA NEXT GEN BY PCR, NASAL: MRSA by PCR Next Gen: NOT DETECTED

## 2021-09-22 MED ORDER — ADULT MULTIVITAMIN W/MINERALS CH
1.0000 | ORAL_TABLET | Freq: Every day | ORAL | Status: DC
  Administered 2021-09-22 – 2021-09-23 (×2): 1 via ORAL
  Filled 2021-09-22 (×3): qty 1

## 2021-09-22 MED ORDER — TAMSULOSIN HCL 0.4 MG PO CAPS
0.4000 mg | ORAL_CAPSULE | Freq: Every day | ORAL | Status: DC
  Administered 2021-09-22: 0.4 mg via ORAL
  Filled 2021-09-22: qty 1

## 2021-09-22 MED ORDER — LABETALOL HCL 5 MG/ML IV SOLN: 10.0000 mg | INTRAVENOUS | Status: DC | PRN

## 2021-09-22 MED ORDER — CLOPIDOGREL BISULFATE 75 MG PO TABS: 75.0000 mg | ORAL_TABLET | Freq: Every day | ORAL | Status: DC

## 2021-09-22 MED ORDER — PANTOPRAZOLE SODIUM 40 MG PO TBEC
40.0000 mg | DELAYED_RELEASE_TABLET | Freq: Every day | ORAL | Status: DC
  Administered 2021-09-22 – 2021-09-23 (×2): 40 mg via ORAL
  Filled 2021-09-22 (×2): qty 1

## 2021-09-22 MED ORDER — EZETIMIBE 10 MG PO TABS
10.0000 mg | ORAL_TABLET | Freq: Every day | ORAL | Status: DC
  Administered 2021-09-22 – 2021-09-23 (×2): 10 mg via ORAL
  Filled 2021-09-22 (×2): qty 1

## 2021-09-22 MED ORDER — CHLORTHALIDONE 25 MG PO TABS
25.0000 mg | ORAL_TABLET | Freq: Every morning | ORAL | Status: DC
  Administered 2021-09-23: 25 mg via ORAL
  Filled 2021-09-22: qty 1

## 2021-09-22 MED ORDER — LISINOPRIL 20 MG PO TABS
40.0000 mg | ORAL_TABLET | Freq: Every morning | ORAL | Status: DC
  Administered 2021-09-23: 40 mg via ORAL
  Filled 2021-09-22: qty 2

## 2021-09-22 MED ORDER — ASPIRIN 81 MG PO CHEW: 81.0000 mg | CHEWABLE_TABLET | Freq: Every day | ORAL | Status: DC

## 2021-09-22 MED ORDER — HYDRALAZINE HCL 20 MG/ML IJ SOLN: 10.0000 mg | INTRAMUSCULAR | Status: DC | PRN

## 2021-09-22 NOTE — Evaluation (Signed)
Occupational Therapy Evaluation Patient Details Name: Richard Austin MRN: [id number] DOB: 30-Oct-1945 Today's Date: 09/22/2021   History of Present Illness 76 y/o male presented to ED on 09/21/21 for L facial droop, L sided weakness, and slurred speech. TNK given. CT head negative. CTA negative. MRI pending. PMH: HTN, prediabetes, L carpal tunnel sx on 09/20/21   Clinical Impression   Patient admitted for the diagnosis above.  PTA he recently hand a L carpel tunnel release, and lives with his spouse, who can assist as needed.  Mild speech and short term memory deficits noted.  He is needing a little assist at home post hand surgery, but should progress as he is able to use his L hand more.  He is having some shoulder dysfunction after a fall, but there are no changes per the patient.  No acute OT needs, encouraged ROM as prescribed by his hand surgeon, and to walk the halls.  Eventual outpatient hand therapy as prescribed by hand surgeon.        Recommendations for follow up therapy are one component of a multi-disciplinary discharge planning process, led by the attending physician.  Recommendations may be updated based on patient status, additional functional criteria and insurance authorization.   Follow Up Recommendations  Other (comment) (follow hand surgeon recommendations)    Assistance Recommended at Discharge PRN  Patient can return home with the following      Functional Status Assessment  Patient has had a recent decline in their functional status and demonstrates the ability to make significant improvements in function in a reasonable and predictable amount of time.  Equipment Recommendations  None recommended by OT    Recommendations for Other Services       Precautions / Restrictions Precautions Precautions: None;Other (comment) Precaution Comments: recent L carpal tunnel sx on 1/10 Restrictions Weight Bearing Restrictions: No Other Position/Activity Restrictions:  minimize weightbearing through L UE      Mobility Bed Mobility               General bed mobility comments: in recliner on arrival    Transfers Overall transfer level: Independent Equipment used: None                      Balance Overall balance assessment: No apparent balance deficits (not formally assessed)                                         ADL either performed or assessed with clinical judgement   ADL                                         General ADL Comments: needing min a for UB/LB dressing due to CTR of L hand.     Vision Baseline Vision/History: 1 Wears glasses Patient Visual Report: No change from baseline       Perception Perception Perception: Not tested   Praxis Praxis Praxis: Not tested    Pertinent Vitals/Pain Pain Assessment: Faces Faces Pain Scale: Hurts little more Pain Location: L hand Pain Descriptors / Indicators: Discomfort Pain Intervention(s): Monitored during session     Hand Dominance Right   Extremity/Trunk Assessment Upper Extremity Assessment Upper Extremity Assessment: LUE deficits/detail LUE Deficits / Details: L CTR, encouraged P/AROM to L hand/wrist as  prescribed by hand MD.  Patient with recent fall and shoulder injury. LUE: Unable to fully assess due to pain;Shoulder pain with ROM LUE Sensation: decreased light touch LUE Coordination: decreased fine motor;decreased gross motor   Lower Extremity Assessment Lower Extremity Assessment: Defer to PT evaluation   Cervical / Trunk Assessment Cervical / Trunk Assessment: Normal   Communication Communication Communication: Expressive difficulties   Cognition Arousal/Alertness: Awake/alert Behavior During Therapy: WFL for tasks assessed/performed Overall Cognitive Status: Within Functional Limits for tasks assessed                                       General Comments       Exercises     Shoulder  Instructions      Home Living Family/patient expects to be discharged to:: Private residence Living Arrangements: Spouse/significant other Available Help at Discharge: Family Type of Home: House Home Access: Stairs to enter Technical brewer of Steps: 3 Entrance Stairs-Rails: None Home Layout: Two level;Bed/bath upstairs Alternate Level Stairs-Number of Steps: flight Alternate Level Stairs-Rails: Right;Left;Can reach both Bathroom Shower/Tub: Walk-in shower;Tub/shower unit   Bathroom Toilet: Handicapped height Bathroom Accessibility: Yes How Accessible: Accessible via walker Home Equipment: Shower seat;Grab bars - tub/shower      Lives With: Spouse    Prior Functioning/Environment Prior Level of Function : Independent/Modified Independent               ADLs Comments: patient needing more assist with ADL and driving since CTR to L hand.  L hand bandaged and swollen.        OT Problem List: Pain      OT Treatment/Interventions:      OT Goals(Current goals can be found in the care plan section) Acute Rehab OT Goals Patient Stated Goal: return home OT Goal Formulation: With patient Time For Goal Achievement: 09/26/21 Potential to Achieve Goals: Good  OT Frequency:      Co-evaluation              AM-PAC OT "6 Clicks" Daily Activity     Outcome Measure Help from another person eating meals?: None Help from another person taking care of personal grooming?: A Little Help from another person toileting, which includes using toliet, bedpan, or urinal?: None Help from another person bathing (including washing, rinsing, drying)?: A Little Help from another person to put on and taking off regular upper body clothing?: A Little Help from another person to put on and taking off regular lower body clothing?: A Little 6 Click Score: 20   End of Session    Activity Tolerance: Patient tolerated treatment well Patient left: in chair;with call bell/phone within  reach  OT Visit Diagnosis: Pain Pain - Right/Left: Left Pain - part of body: Hand                Time: 6144-3154 OT Time Calculation (min): 18 min Charges:  OT General Charges $OT Visit: 1 Visit OT Evaluation $OT Eval Moderate Complexity: 1 Mod  09/22/2021  RP, OTR/L  Acute Rehabilitation Services  Office:  [phone]   Metta Clines 09/22/2021, 4:46 PM

## 2021-09-22 NOTE — Progress Notes (Signed)
°  Transition of Care [facility]) Screening Note   Patient Details  Name: Rodrigus Kilker Date of Birth: [birth year]   [facility]) CM/SW Contact:    Geralynn Ochs, LCSW Phone Number: 09/22/2021, 12:54 PM    Transition of Care Department [facility]) has reviewed patient and no TOC needs have been identified at this time; medical workup ongoing. We will continue to monitor patient advancement through interdisciplinary progression rounds. If new patient transition needs arise, please place a TOC consult.

## 2021-09-22 NOTE — Progress Notes (Incomplete)
Echocardiogram 2D Echocardiogram has been performed.  Richard Austin 09/22/2021, 9:21 AM

## 2021-09-22 NOTE — Evaluation (Signed)
Physical Therapy Evaluation & Discharge Patient Details Name: Richard Austin MRN: [id number] DOB: 06/22/1946 Today's Date: 09/22/2021  History of Present Illness  76 y/o male presented to ED on 09/21/21 for L facial droop, L sided weakness, and slurred speech. TNK given. CT head negative. CTA negative. MRI pending. PMH: HTN, prediabetes, L carpal tunnel sx on 09/20/21  Clinical Impression  Patient admitted with above diagnosis. Patient functioning at independent level with no AD. Patient able to make quick turns, head turns, and sudden stops with no LOB. Patient negotiated 10 stairs independently. Patient reports being at baseline for mobility. No further skilled PT needs required acutely. No PT follow up recommended at this time.        Recommendations for follow up therapy are one component of a multi-disciplinary discharge planning process, led by the attending physician.  Recommendations may be updated based on patient status, additional functional criteria and insurance authorization.  Follow Up Recommendations No PT follow up    Assistance Recommended at Discharge PRN  Patient can return home with the following       Equipment Recommendations None recommended by PT  Recommendations for Other Services       Functional Status Assessment Patient has not had a recent decline in their functional status     Precautions / Restrictions Precautions Precautions: None Precaution Comments: recent L carpal tunnel sx on 1/10 Restrictions Other Position/Activity Restrictions: minimize weightbearing through L UE      Mobility  Bed Mobility               General bed mobility comments: in recliner on arrival    Transfers Overall transfer level: Independent Equipment used: None                    Ambulation/Gait Ambulation/Gait assistance: Independent Gait Distance (Feet): 250 Feet Assistive device: None Gait Pattern/deviations: WFL(Within Functional Limits)    Gait velocity interpretation: >4.37 ft/sec, indicative of normal walking speed   General Gait Details: no LOB with head turns, sudden stops, and turns  Stairs Stairs: Yes Stairs assistance: Independent Stair Management: No rails Number of Stairs: 10    Wheelchair Mobility    Modified Rankin (Stroke Patients Only) Modified Rankin (Stroke Patients Only) Pre-Morbid Rankin Score: No symptoms Modified Rankin: No symptoms     Balance Overall balance assessment: No apparent balance deficits (not formally assessed)                                           Pertinent Vitals/Pain Pain Assessment: Faces Faces Pain Scale: Hurts even more Pain Location: L hand Pain Descriptors / Indicators: Discomfort Pain Intervention(s): Monitored during session    Home Living Family/patient expects to be discharged to:: Private residence Living Arrangements: Spouse/significant other Available Help at Discharge: Family Type of Home: House Home Access: Stairs to enter Entrance Stairs-Rails: None Entrance Stairs-Number of Steps: 3 Alternate Level Stairs-Number of Steps: flight Home Layout: Two level;Bed/bath upstairs Home Equipment: Shower seat;Grab bars - tub/shower      Prior Function Prior Level of Function : Independent/Modified Independent                     Hand Dominance   Dominant Hand: Right    Extremity/Trunk Assessment   Upper Extremity Assessment Upper Extremity Assessment: Defer to OT evaluation    Lower Extremity Assessment Lower Extremity  Assessment: Overall WFL for tasks assessed    Cervical / Trunk Assessment Cervical / Trunk Assessment: Normal  Communication   Communication: Other (comment) (slurred speech)  Cognition Arousal/Alertness: Awake/alert Behavior During Therapy: WFL for tasks assessed/performed Overall Cognitive Status: Within Functional Limits for tasks assessed                                           General Comments      Exercises     Assessment/Plan    PT Assessment Patient does not need any further PT services  PT Problem List         PT Treatment Interventions      PT Goals (Current goals can be found in the Care Plan section)  Acute Rehab PT Goals Patient Stated Goal: to go home PT Goal Formulation: All assessment and education complete, DC therapy    Frequency       Co-evaluation               AM-PAC PT "6 Clicks" Mobility  Outcome Measure Help needed turning from your back to your side while in a flat bed without using bedrails?: None Help needed moving from lying on your back to sitting on the side of a flat bed without using bedrails?: None Help needed moving to and from a bed to a chair (including a wheelchair)?: None Help needed standing up from a chair using your arms (e.g., wheelchair or bedside chair)?: None Help needed to walk in hospital room?: None Help needed climbing 3-5 steps with a railing? : None 6 Click Score: 24    End of Session   Activity Tolerance: Patient tolerated treatment well Patient left: in chair;with call bell/phone within reach;with chair alarm set Nurse Communication: Mobility status PT Visit Diagnosis: Unsteadiness on feet (R26.81)    Time: 9675-9163 PT Time Calculation (min) (ACUTE ONLY): 18 min   Charges:   PT Evaluation $PT Eval Low Complexity: 1 Low          Lucile Didonato A. Gilford Rile PT, DPT Acute Rehabilitation Services Pager [phone] Office [phone]   Linna Hoff 09/22/2021, 4:22 PM

## 2021-09-22 NOTE — Progress Notes (Addendum)
STROKE TEAM PROGRESS NOTE   INTERVAL HISTORY Patient is seen in his room with no family at the bedside.  Yesterday, he presented with sudden onset of left sided facial weakness, left arm and leg weakness and slurred speech.  TNK was administered.  Of note, patient's ROM and strength in his LUE are limited due to recent carpal tunnel surgery and a recent fall with shoulder injury.  Vitals:   09/22/21 1000 09/22/21 1055 09/22/21 1100 09/22/21 1200  BP: (!) 114/93  (!) 134/99   Pulse: 81  86 85  Resp: (!) 25  17 15   Temp:  97.7 F (36.5 C)  97.9 F (36.6 C)  TempSrc:  Oral  Oral  SpO2: 98%  96% 97%  Weight:       CBC:  Recent Labs  Lab 09/21/21 1924 09/21/21 1925  WBC 12.1*  --   NEUTROABS 10.1*  --   HGB 11.8* 12.6*  HCT 36.1* 37.0*  MCV 89.1  --   PLT 299  --    Basic Metabolic Panel:  Recent Labs  Lab 09/21/21 1924 09/21/21 1925  NA 137 137  K 3.6 3.6  CL 103 106  CO2 22  --   GLUCOSE 175* 174*  BUN 21 23  CREATININE 1.11 1.10  CALCIUM 9.3  --    Lipid Panel:  Recent Labs  Lab 09/22/21 0408  CHOL 209*  TRIG 183*  HDL 35*  CHOLHDL 6.0  VLDL 37  LDLCALC 137*   HgbA1c:  Recent Labs  Lab 09/22/21 0408  HGBA1C 6.4*   Urine Drug Screen: No results for input(s): LABOPIA, COCAINSCRNUR, LABBENZ, AMPHETMU, THCU, LABBARB in the last 168 hours.  Alcohol Level No results for input(s): ETH in the last 168 hours.  IMAGING past 24 hours CT HEAD CODE STROKE WO CONTRAST  Result Date: 09/21/2021 CLINICAL DATA:  Code stroke. Initial evaluation for acute neuro deficit, stroke suspected. EXAM: CT HEAD WITHOUT CONTRAST TECHNIQUE: Contiguous axial images were obtained from the base of the skull through the vertex without intravenous contrast. RADIATION DOSE REDUCTION: This exam was performed according to the departmental dose-optimization program which includes automated exposure control, adjustment of the mA and/or kV according to patient size and/or use of iterative  reconstruction technique. COMPARISON:  None available. FINDINGS: Brain: Cerebral volume within normal limits for age. Chronic microvascular ischemic disease noted involving the supratentorial cerebral white matter. Multiple scattered superimposed remote lacunar infarcts present about the bilateral basal ganglia. Few small remote cerebellar infarcts noted. No acute intracranial hemorrhage. No acute large vessel territory infarct. No mass lesion or midline shift. No hydrocephalus or extra-axial fluid collection. Vascular: No hyperdense vessel. Scattered vascular calcifications noted within the carotid siphons. Skull: Scalp soft tissues and calvarium within normal limits. Sinuses/Orbits: Prior ocular lens replacement on the left. Globes and orbital soft tissues demonstrate no acute finding. Scattered mucoperiosteal thickening present throughout the paranasal sinuses. No air-fluid levels to suggest acute sinusitis. No mastoid effusion. Other: None. ASPECTS [facility] Stroke Program Early CT Score) - Ganglionic level infarction (caudate, lentiform nuclei, internal capsule, insula, M1-M3 cortex): 7 - Supraganglionic infarction (M4-M6 cortex): 3 Total score (0-10 with 10 being normal): 10 IMPRESSION: 1. No acute intracranial abnormality. 2. ASPECTS is 10. 3. Chronic microvascular ischemic disease with multiple remote lacunar infarcts about the bilateral basal ganglia and cerebellum. These results were communicated to Dr. Rory Percy at 7:38 pm on 09/21/2021 by text page via the [facility] messaging system. Electronically Signed   By: Pincus Badder.D.  On: 09/21/2021 19:39   CT ANGIO HEAD NECK W WO CM (CODE STROKE)  Result Date: 09/21/2021 CLINICAL DATA:  Initial evaluation for neuro deficit, stroke. EXAM: CT ANGIOGRAPHY HEAD AND NECK TECHNIQUE: Multidetector CT imaging of the head and neck was performed using the standard protocol during bolus administration of intravenous contrast. Multiplanar CT image reconstructions and  MIPs were obtained to evaluate the vascular anatomy. Carotid stenosis measurements (when applicable) are obtained utilizing NASCET criteria, using the distal internal carotid diameter as the denominator. RADIATION DOSE REDUCTION: This exam was performed according to the departmental dose-optimization program which includes automated exposure control, adjustment of the mA and/or kV according to patient size and/or use of iterative reconstruction technique. CONTRAST:  133mL OMNIPAQUE IOHEXOL 350 MG/ML SOLN COMPARISON:  Head CT from earlier the same day. FINDINGS: CTA NECK FINDINGS Aortic arch: Visualized aortic arch normal in caliber with normal branch pattern. Moderate atheromatous change about the arch. No hemodynamically significant stenosis about the origin the great vessels. Right carotid system: Right CCA patent from its origin to the bifurcation without stenosis. Moderate calcified plaque about the right carotid bulb/proximal right ICA without hemodynamically significant stenosis. Right ICA tortuous but widely patent distally without stenosis or dissection. Left carotid system: Left CCA patent from its origin to the bifurcation without stenosis. Moderate atheromatous change about the left carotid bulb/proximal left ICA without hemodynamically significant stenosis. Left ICA tortuous but widely patent distally without stenosis or dissection. Vertebral arteries: Both vertebral arteries arise from the subclavian arteries. No proximal subclavian artery stenosis. Atheromatous change about the origins of both vertebral arteries with associated severe ostial stenosis on the right. Vertebral arteries mildly tortuous but otherwise patent distally without stenosis or dissection. Skeleton: Reversal of the normal cervical lordosis with advanced spondylosis at C4-5 through C6-7. No discrete or worrisome osseous lesions. Other neck: No other acute soft tissue abnormality within the neck. Upper chest: Visualized upper chest  demonstrates no acute finding. Mildly prominent shotty subcentimeter mediastinal lymph nodes noted. Review of the MIP images confirms the above findings CTA HEAD FINDINGS Anterior circulation: Petrous segments patent bilaterally. Atheromatous change within the carotid siphons without hemodynamically significant stenosis. A1 segments patent bilaterally. Normal anterior communicating artery complex. Anterior cerebral arteries patent without stenosis. No M1 stenosis or occlusion. Normal MCA bifurcations. No proximal MCA branch occlusion. To distal small vessel atheromatous irregularity. Posterior circulation: Both V4 segments patent without significant stenosis. Both PICA origins patent. Basilar patent to its distal aspect without stenosis. Superior cerebellar arteries patent bilaterally. Left PCA supplied via the basilar. Right PCA supplied via a hypoplastic right P1 segment and prominent right posterior communicating artery. With PCAs patent to their distal aspects without stenosis. Venous sinuses: Patent allowing for timing the contrast bolus. Anatomic variants: None significant.  No aneurysm. Review of the MIP images confirms the above findings IMPRESSION: 1. Negative CTA for emergent large vessel occlusion. 2. Atheromatous change about the origin of the right vertebral artery with associated severe ostial stenosis. 3. Moderate atherosclerotic change about the carotid bifurcations and carotid siphons without hemodynamically significant stenosis. 4. Diffuse tortuosity of the major arterial vasculature of the head and neck, suggesting chronic underlying hypertension. 5.  Aortic Atherosclerosis (ICD10-I70.0). Electronically Signed   By: Jeannine Boga M.D.   On: 09/21/2021 20:09    PHYSICAL EXAM General: Alert, well-developed, well nourished male in no acute distress.  Surgical wrap on left wrist with some swelling noted.   NEURO:  Mental Status: AA&Ox3  Speech/Language: speech is without dysarthria or  aphasia.  Naming, repetition, fluency, and comprehension intact.  Cranial Nerves:  II: PERRL. Visual fields full.  III, IV, VI: EOMI. Eyelids elevate symmetrically.  V: Sensation is intact to light touch and symmetrical to face.  VII: Slight left facial droop VIII: hearing intact to voice. IX, X: Phonation is normal.  XII: tongue is midline without fasciculations. Motor: 5/5 strength to RUE, RLE and LLE.  2/5 strength in LUE Sensation- Intact to light touch bilaterally.  Coordination: No drift in RUE, unable to perform on left Gait- deferred   ASSESSMENT/PLAN Mr. Perris Conwell is a 76 y.o. male with history of arthritis, HTN, prediabetes and recent left wrist carpal tunnel syndrome presenting with sudden onset of left sided facial weakness, left arm and leg weakness and slurred speech.  EMS was called and patient was brought to the ED.  TNK was administered.  Of note, patient's ROM and strength in his LUE are limited due to recent carpal tunnel surgery and a recent fall with shoulder injury.  Stroke:  likely left subcortical infarct secondary to small vessel disease Code Stroke CT head No acute abnormality. Small vessel disease. ASPECTS 10.    CTA head & neck severe ostial stenosis at origin of right vertebral artery, diffuse tortuosity of major arterial vasculature of the head MRI  pending 2D Echo pending LDL 137 HgbA1c 6.4 VTE prophylaxis - SCDs No antithrombotic prior to admission, now on No antithrombotic as he is within 24 hours of TNK.  Therapy recommendations:  pending Disposition:  pending  Hypertension Home meds:  chlorthalidone 25 mg daily Stable Keep BP <180/105 Long-term BP goal normotensive  Hyperlipidemia Home meds:  none LDL 137, goal < 70 Add zetia 10 mg daily  High intensity statin not indicated due to statin intolerance Consider lipid clinic referral Continue statin at discharge  Pre-Diabetes type II  Home meds:  none HgbA1c 6.4, goal <  7.0 CBGs SSI  Other Stroke Risk Factors Advanced Age >/= 66  Former Cigarette smoker  Other Active Problems Recent left shoulder injury Patient has outpatient MRI scheduled Recent left carpal tunnel surgery Follow up with orthopedic physician  Hospital day # [place] , MSN, AGACNP-BC [facility] See Amion for schedule and pager information 09/22/2021 12:42 PM   ATTENDING NOTE: I reviewed above note and agree with the assessment and plan. Pt was seen and examined.   76 year old male with history of hypertension, prediabetes, recent CTS status post left wrist surgery and recent left shoulder injury due to fall admitted for left facial droop, slurred speech and left-sided numbness.  CT no acute abnormality.  Status post TN K.  CTA head and neck showed right VA origin stenosis.  MRI pending.  2D echo pending.  LDL 137, A1c 6.4.  Creatinine 1.10.  On exam, patient awake alert, oriented x3, no aphasia, slight dysarthria, follows simple commands, able to name repeat.  No gaze palsy visual fields full, mild left facial droop.  Tongue midline.  Right upper and lower extremity 5/5, left lower extremity proximal 5/5, distal 5 -/5.  However left upper extremity proximal 3 -/5 due to shoulder injury, left finger grip 2/5 due to recent CTS surgery.  Sensation decreased on the left hand due to CTS which is chronic.  Right finger-to-nose intact.  Etiology for patient stroke likely due to left brain subcortical infarct of small vessel disease.  Continue stroke risk factor modification.  Consider DAPT once 24 hours after TN K and MRI  negative for bleeding.  Patient intolerance to statin, recommend Zetia 10.  PT no recommendation, OT pending.  For detailed assessment and plan, please refer to above as I have made changes wherever appropriate.   Rosalin Hawking, MD PhD Stroke Neurology 09/22/2021 4:28 PM  This patient is critically ill due to stroke status post TN K and at  significant risk of neurological worsening, death form recurrent stroke, hemorrhagic transformation, bleeding from TN K. This patient's care requires constant monitoring of vital signs, hemodynamics, respiratory and cardiac monitoring, review of multiple databases, neurological assessment, discussion with family, other specialists and medical decision making of high complexity. I spent 35 minutes of neurocritical care time in the care of this patient.    To contact Stroke Continuity provider, please refer to [email or web address] After hours, contact General Neurology

## 2021-09-22 NOTE — Progress Notes (Signed)
Overnight cross coverage note  24-hour post IV thrombolysis MRI reviewed.  Small right MCA territory infarct with hemorrhagic transformation. No hydrocephalus.  No midline shift. Seen and evaluated the patient-no complains of headache.  Exam remained stable from the morning with mild left-sided facial droop and left arm weakness.  Updated recommendation No aspirin or Plavix. No anticoagulants for DVT prophylaxis Strict blood pressure control-goal blood pressure less than 140. Ordered as needed labetalol and hydralazine IV. If difficult to control blood pressures with the as needed medications, will need transfer to the ICU for Cleviprex. Repeat head CT in the morning or sooner if examination deteriorates or complains of headache. Communicated my plan to the patient and to the bedside RN  -- Amie [place], MD Neurologist [facility] Pager: [phone]

## 2021-09-22 NOTE — TOC CAGE-AID Note (Signed)
Transition of Care [facility]) - CAGE-AID Screening   Patient Details  Name: Richard Austin MRN: [id number] Date of Birth: 1946-03-26  Transition of Care [facility]) CM/SW Contact:    Trust Crago C Tarpley-Carter, [place] Phone Number: 09/22/2021, 9:56 AM   Clinical Narrative: Pt is unable to participate in Cage Aid. Pt does smoke cigarettes.  CSW will assess at a better time.  Shiela Bruns Tarpley-Carter, MSW, LCSW-A Pronouns:  She/Her/Hers [place] Transitions of Care Clinical Social Worker Direct Number:  [phone] [email or web address]  CAGE-AID Screening: Substance Abuse Screening unable to be completed due to: : Patient unable to participate             Substance Abuse Education Offered: Yes (Pt smokes cigarettes.)  Substance abuse interventions: Scientist, clinical (histocompatibility and immunogenetics)

## 2021-09-22 NOTE — Evaluation (Signed)
Speech Language Pathology Evaluation Patient Details Name: Richard Austin MRN: [id number] DOB: March 24, 1946 Today's Date: 09/22/2021 Time: 1130-1150 SLP Time Calculation (min) (ACUTE ONLY): 20 min  Problem List:  Patient Active Problem List   Diagnosis Date Noted   Acute ischemic stroke ([place]) 09/21/2021   Abscess of groin, right 07/19/2021   Subcutaneous abscess 07/18/2021   SBO (small bowel obstruction) ([place]) 07/06/2018   AKI (acute kidney injury) ([place]) 07/06/2018   Hyperglycemia 07/06/2018   Hypertension 07/06/2018   Leukocytosis 07/06/2018   Bladder mass 07/06/2018   Pericardial effusion 07/06/2018   Hypercalcemia 07/06/2018   Past Medical History:  Past Medical History:  Diagnosis Date   Arthritis    Bladder tumor    Diverticulitis    Diverticulosis    Hypertension    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 2019   Pre-diabetes    Past Surgical History:  Past Surgical History:  Procedure Laterality Date   ABDOMINAL SURGERY     ABLATION FOR SVT  2000 OR 2001   APPENDECTOMY  1970s   COLOSTOMY     LATER REVERSED   IRRIGATION AND DEBRIDEMENT ABSCESS N/A 07/18/2021   Procedure: IRRIGATION AND DEBRIDEMENT ABSCESS;  Surgeon: Irine Seal, MD;  Location: [facility];  Service: Urology;  Laterality: N/A;   LUMBAR LAMINECTOMY  1970s   NESBIT PROCEDURE N/A    TRANSURETHRAL RESECTION OF BLADDER TUMOR WITH MITOMYCIN-C N/A 08/21/2018   Procedure: TRANSURETHRAL RESECTION OF BLADDER TUMOR WITH GEMCITABINE;  Surgeon: Lucas Mallow, MD;  Location: [facility];  Service: Urology;  Laterality: N/A;   HPI:  Richard Austin is a 76 y.o. male presenting for evaluation of sudden onset of left facial weakness, left-sided numbness and slurred speech.  Patient reports that the left side of his face and left arm feels numb.  Had left-sided facial droop.  Past medical history of arthritis, hypertension, prediabetes, recent carpal tunnel surgery on the left wrist.  Brought in by EMS as a code  stroke. CT is negative, MRI pending.   Assessment / Plan / Recommendation Clinical Impression  Pt demonstrates a mild dysarthria due to left CN VII and XII weakness. Pt scored 26 out of 30 on [facility] cognitive screen with only minimal erros in short term memory. Pt reports using adequate organizational support for ADL's at baseline. SLP offered basic compensatory strategies for dysarthria. Pt return demonstrated accurated in 1/2 trials after a model. Did not carry over. Also discussed communicaiton strategies with husband and wife. Pt recommended to f/u at OP SLP level to address speech. No acute f/u needed.    SLP Assessment  SLP Recommendation/Assessment: All further Speech Lanaguage Pathology  needs can be addressed in the next venue of care SLP Visit Diagnosis: Dysarthria and anarthria (R47.1)    Recommendations for follow up therapy are one component of a multi-disciplinary discharge planning process, led by the attending physician.  Recommendations may be updated based on patient status, additional functional criteria and insurance authorization.    Follow Up Recommendations  Outpatient SLP    Assistance Recommended at Discharge     Functional Status Assessment    Frequency and Duration           SLP Evaluation Cognition  Overall Cognitive Status: Impaired/Different from baseline Arousal/Alertness: Awake/alert Orientation Level: Oriented X4 Attention: Alternating Alternating Attention: Appears intact Memory: Impaired Memory Impairment: Retrieval deficit Awareness: Appears intact Problem Solving: Appears intact Safety/Judgment: Appears intact       Comprehension  Auditory Comprehension Overall Auditory Comprehension:  Appears within functional limits for tasks assessed Reading Comprehension Reading Status: Within funtional limits    Expression Verbal Expression Overall Verbal Expression: Appears within functional limits for tasks assessed Written Expression Dominant  Hand: Right   Oral / Motor  Oral Motor/Sensory Function Overall Oral Motor/Sensory Function: Mild impairment Facial ROM: Reduced left;Suspected CN VII (facial) dysfunction Facial Symmetry: Abnormal symmetry left;Suspected CN VII (facial) dysfunction Facial Strength: Reduced left;Suspected CN VII (facial) dysfunction Facial Sensation: Reduced left;Suspected CN V (Trigeminal) dysfunction Lingual ROM: Reduced left;Suspected CN XII (hypoglossal) dysfunction Lingual Strength: Reduced;Suspected CN XII (hypoglossal) dysfunction Lingual Sensation: Within Functional Limits Velum: Within Functional Limits Mandible: Within Functional Limits Motor Speech Overall Motor Speech: Impaired Respiration: Within functional limits Phonation: Normal Resonance: Within functional limits Articulation: Impaired Level of Impairment: Conversation Intelligibility: Intelligible Motor Planning: Witnin functional limits Motor Speech Errors: Aware;Consistent            Richard Austin, Richard Austin 09/22/2021, 12:41 PM

## 2021-09-23 ENCOUNTER — Inpatient Hospital Stay (HOSPITAL_COMMUNITY): Payer: Medicare Other

## 2021-09-23 ENCOUNTER — Encounter (HOSPITAL_COMMUNITY): Payer: Medicare Other

## 2021-09-23 DIAGNOSIS — I6389 Other cerebral infarction: Secondary | ICD-10-CM

## 2021-09-23 DIAGNOSIS — I639 Cerebral infarction, unspecified: Secondary | ICD-10-CM

## 2021-09-23 LAB — CBC
HCT: 33.5 % — ABNORMAL LOW (ref 39.0–52.0)
Hemoglobin: 11.2 g/dL — ABNORMAL LOW (ref 13.0–17.0)
MCH: 29.2 pg (ref 26.0–34.0)
MCHC: 33.4 g/dL (ref 30.0–36.0)
MCV: 87.2 fL (ref 80.0–100.0)
Platelets: 256 10*3/uL (ref 150–400)
RBC: 3.84 MIL/uL — ABNORMAL LOW (ref 4.22–5.81)
RDW: 13.8 % (ref 11.5–15.5)
WBC: 11.1 10*3/uL — ABNORMAL HIGH (ref 4.0–10.5)
nRBC: 0 % (ref 0.0–0.2)

## 2021-09-23 LAB — BASIC METABOLIC PANEL
Anion gap: 8 (ref 5–15)
BUN: 20 mg/dL (ref 8–23)
CO2: 25 mmol/L (ref 22–32)
Calcium: 9.4 mg/dL (ref 8.9–10.3)
Chloride: 104 mmol/L (ref 98–111)
Creatinine, Ser: 1.07 mg/dL (ref 0.61–1.24)
GFR, Estimated: >60 mL/min (ref >60)
Glucose, Bld: 132 mg/dL — ABNORMAL HIGH (ref 70–99)
Potassium: 3.5 mmol/L (ref 3.5–5.1)
Sodium: 137 mmol/L (ref 135–145)

## 2021-09-23 MED ORDER — EZETIMIBE 10 MG PO TABS: 10.0000 mg | ORAL_TABLET | Freq: Every day | ORAL | 1 refills | Status: AC

## 2021-09-23 MED ORDER — STROKE: EARLY STAGES OF RECOVERY BOOK
Status: AC
  Filled 2021-09-23: qty 1

## 2021-09-23 NOTE — Discharge Summary (Addendum)
Stroke Discharge Summary  Patient ID: Richard Austin   MRN: [id number]      DOB: [birth year]  Date of Admission: 09/21/2021 Date of Discharge: 09/23/2021  Attending Physician:  Stroke, Md, MD, Rosalin Hawking MD Consultant(s):    None Patient's PCP:  [name], DO  DISCHARGE DIAGNOSIS:  Right MCA infarct with hemorrhagic transformation  Secondary diagnosis  Hypertension Hyperlipidemia Prediabetes CTS left status post surgery   Allergies as of 09/23/2021       Reactions   Amlodipine Other (See Comments)   Muscle tightness, fatigue    Bystolic [nebivolol Hcl] Other (See Comments)   Muscle tightness and fatigue    Statins Other (See Comments)   Muscle tightness and fatigue    Latex Rash        Medication List     STOP taking these medications    cefdinir 300 MG capsule Commonly known as: OMNICEF       TAKE these medications    chlorthalidone 25 MG tablet Commonly known as: HYGROTON Take 25 mg by mouth every morning.   ezetimibe 10 MG tablet Commonly known as: ZETIA Take 1 tablet (10 mg total) by mouth daily. Start taking on: September 24, 2021   fluticasone 50 MCG/ACT nasal spray Commonly known as: FLONASE Place 2 sprays into both nostrils 2 (two) times daily as needed for allergies or rhinitis.   HYDROcodone-acetaminophen 5-325 MG tablet Commonly known as: NORCO/VICODIN Take 1 tablet by mouth every 8 (eight) hours as needed for pain.   ibuprofen 800 MG tablet Commonly known as: ADVIL Take 800 mg by mouth every morning.   Krill Oil 500 MG Caps Take 500 mg by mouth at bedtime.   lisinopril 40 MG tablet Commonly known as: ZESTRIL Take 40 mg by mouth every morning.   multivitamin tablet Take 1 tablet by mouth daily.   omeprazole 40 MG capsule Commonly known as: PRILOSEC Take 40 mg by mouth at bedtime.   Refresh 1.4-0.6 % Soln Generic drug: Polyvinyl Alcohol-Povidone PF Place 1 drop into both eyes daily as needed (dry eyes).    tadalafil 20 MG tablet Commonly known as: CIALIS Take 20 mg by mouth once as needed (very high blood pressure with fluctuations).   tamsulosin 0.4 MG Caps capsule Commonly known as: FLOMAX Take 0.4 mg by mouth at bedtime.        LABORATORY STUDIES CBC    Component Value Date/Time   WBC 11.1 (H) 09/23/2021 0201   RBC 3.84 (L) 09/23/2021 0201   HGB 11.2 (L) 09/23/2021 0201   HCT 33.5 (L) 09/23/2021 0201   PLT 256 09/23/2021 0201   MCV 87.2 09/23/2021 0201   MCH 29.2 09/23/2021 0201   MCHC 33.4 09/23/2021 0201   RDW 13.8 09/23/2021 0201   LYMPHSABS 1.0 09/21/2021 1924   MONOABS 0.7 09/21/2021 1924   EOSABS 0.2 09/21/2021 1924   BASOSABS 0.0 09/21/2021 1924   CMP    Component Value Date/Time   NA 137 09/23/2021 0201   K 3.5 09/23/2021 0201   CL 104 09/23/2021 0201   CO2 25 09/23/2021 0201   GLUCOSE 132 (H) 09/23/2021 0201   BUN 20 09/23/2021 0201   CREATININE 1.07 09/23/2021 0201   CALCIUM 9.4 09/23/2021 0201   PROT 6.5 09/21/2021 1924   ALBUMIN 3.5 09/21/2021 1924   AST 22 09/21/2021 1924   ALT 24 09/21/2021 1924   ALKPHOS 87 09/21/2021 1924   BILITOT 0.4 09/21/2021 1924   GFRNONAA >60 09/23/2021  0201   GFRAA >60 08/14/2018 0811   COAGS Lab Results  Component Value Date   INR 0.9 09/21/2021   Lipid Panel    Component Value Date/Time   CHOL 209 (H) 09/22/2021 0408   TRIG 183 (H) 09/22/2021 0408   HDL 35 (L) 09/22/2021 0408   CHOLHDL 6.0 09/22/2021 0408   VLDL 37 09/22/2021 0408   LDLCALC 137 (H) 09/22/2021 0408   HgbA1C  Lab Results  Component Value Date   HGBA1C 6.4 (H) 09/22/2021   Urinalysis    Component Value Date/Time   COLORURINE AMBER (A) 07/06/2018 2252   APPEARANCEUR CLOUDY (A) 07/06/2018 2252   LABSPEC 1.017 07/06/2018 2252   PHURINE 5.0 07/06/2018 2252   GLUCOSEU NEGATIVE 07/06/2018 2252   HGBUR SMALL (A) 07/06/2018 2252   BILIRUBINUR NEGATIVE 07/06/2018 2252   KETONESUR NEGATIVE 07/06/2018 2252   PROTEINUR 30 (A) 07/06/2018  2252   NITRITE NEGATIVE 07/06/2018 2252   LEUKOCYTESUR LARGE (A) 07/06/2018 2252   Urine Drug Screen No results found for: LABOPIA, COCAINSCRNUR, LABBENZ, AMPHETMU, THCU, LABBARB  Alcohol Level No results found for: ETH   SIGNIFICANT DIAGNOSTIC STUDIES CT HEAD WO CONTRAST (5MM)  Result Date: 09/23/2021 CLINICAL DATA:  Stroke follow-up. EXAM: CT HEAD WITHOUT CONTRAST TECHNIQUE: Contiguous axial images were obtained from the base of the skull through the vertex without intravenous contrast. RADIATION DOSE REDUCTION: This exam was performed according to the departmental dose-optimization program which includes automated exposure control, adjustment of the mA and/or kV according to patient size and/or use of iterative reconstruction technique. COMPARISON:  MR brain 09/22/2021 FINDINGS: Brain: 3.2 x 1.4 x 2.2 cm intraparenchymal hematoma in the posterior right frontal lobe with surrounding vasogenic edema. No evidence of hydrocephalus, extra-axial collection or mass lesion/mass effect. Periventricular white matter low attenuation as can be seen with microvascular disease. Generalized cerebral atrophy. Vascular: No hyperdense vessel or unexpected calcification. Skull: No osseous abnormality. Sinuses/Orbits: Mucosal thickening of the left frontal sinus and bilateral ethmoid sinuses. Visualized mastoid sinuses are clear. Visualized orbits demonstrate no focal abnormality. Other: None IMPRESSION: 1. A 3.2 x 1.4 x 2.2 cm intraparenchymal hematoma in the posterior right frontal lobe with surrounding vasogenic edema. Electronically Signed   By: Kathreen Devoid M.D.   On: 09/23/2021 10:20   MR BRAIN WO CONTRAST  Result Date: 09/22/2021 CLINICAL DATA:  Follow-up examination for acute stroke. EXAM: MRI HEAD WITHOUT CONTRAST TECHNIQUE: Multiplanar, multiecho pulse sequences of the brain and surrounding structures were obtained without intravenous contrast. COMPARISON:  Prior CTs from 09/21/2021. FINDINGS: Brain:  Cerebral volume within normal limits for age. Scattered patchy T2/FLAIR hyperintensity involving the periventricular and deep white matter both cerebral hemispheres, most like related chronic microvascular ischemic disease, mild in nature. Few scattered small remote bilateral cerebellar infarcts noted. Few small remote lacunar infarcts present about the basal ganglia and hemispheric cerebral white matter. There has been interval development of an intraparenchymal hematoma centered at the posterior right frontal region, measuring 2.5 x 2.5 x 2.9 cm (series 7, image 17, estimated volume 9 mL). Internal fluid-fluid level. Surrounding vasogenic edema without significant regional mass effect. Probable small amount of associated subarachnoid blood within this region. No other evidence for acute or chronic intracranial hemorrhage. No other areas of acute or chronic infarct seen elsewhere within the brain. No mass lesion or midline shift. No hydrocephalus or extra-axial fluid collection. Pituitary gland suprasellar region normal. Midline structures intact. Vascular: Major intracranial vascular flow voids are maintained. Skull and upper cervical spine: Craniocervical junction within  normal limits. Degenerative spondylosis noted at C3-4 with mild spinal stenosis. Bone marrow signal intensity normal. Initial no scalp soft tissue abnormality. Sinuses/Orbits: Patient status post ocular lens replacement on the left. Globes and orbital soft tissues demonstrate no other acute finding. Scattered mucosal thickening noted throughout the paranasal sinuses. No air-fluid levels to suggest acute sinusitis. Trace right mastoid effusion, of doubtful significance. Inner ear structures grossly normal. Other: None. IMPRESSION: 1. Interval development of an acute intraparenchymal hematoma measuring 2.5 x 2.5 x 2.9 cm at the posterior right frontal region, estimated volume 9 mL. Associated localized vasogenic edema without significant regional  mass effect or midline shift. Finding presumably reflects an ischemic stroke with hemorrhagic transformation. 2. No other acute intracranial abnormality. 3. Underlying mild chronic microvascular ischemic disease with a few scattered remote lacunar infarcts involving the hemispheric cerebral white matter, basal ganglia, and cerebellum. Critical Value/emergent results were called by telephone at the time of interpretation on 09/22/2021 at 7:31 pm to provider [facility] , who verbally acknowledged these results. Electronically Signed   By: Jeannine Boga M.D.   On: 09/22/2021 19:49   ECHOCARDIOGRAM COMPLETE  Result Date: 09/22/2021    ECHOCARDIOGRAM REPORT   Patient Name:   RAYMOUND KATICH [facility] Date of Exam: 09/22/2021 Medical Rec #:  [id number]           Height:       68.0 in Accession #:    [id number]          Weight:       179.0 lb Date of Birth:  Apr 27, 1946          BSA:          1.950 m Patient Age:    61 years            BP:           136/61 mmHg Patient Gender: M                   HR:           77 bpm. Exam Location:  Inpatient Procedure: 2D Echo Indications:    Stroke  History:        Patient has prior history of Echocardiogram examinations, most                 recent 07/08/2018. Risk Factors:Hypertension.  Sonographer:    Jefferey Pica Referring Phys: [id number] ASHISH ARORA IMPRESSIONS  1. Left ventricular ejection fraction, by estimation, is 60 to 65%. The left ventricle has normal function. The left ventricle has no regional wall motion abnormalities. There is mild left ventricular hypertrophy. Left ventricular diastolic parameters are consistent with Grade I diastolic dysfunction (impaired relaxation).  2. Right ventricular systolic function is normal. The right ventricular size is normal. There is normal pulmonary artery systolic pressure. The estimated right ventricular systolic pressure is 81.0 mmHg.  3. Left atrial size was mildly dilated.  4. The mitral valve is degenerative. Trivial mitral  valve regurgitation. No evidence of mitral stenosis. The mean mitral valve gradient is 3.0 mmHg. Moderate mitral annular calcification.  5. The aortic valve is tricuspid. Aortic valve regurgitation is mild. Aortic valve sclerosis/calcification is present, without any evidence of aortic stenosis.  6. Aortic dilatation noted. There is mild dilatation of the aortic root, measuring 43 mm.  7. The inferior vena cava is normal in size with <50% respiratory variability, suggesting right atrial pressure of 8 mmHg. FINDINGS  Left Ventricle: Left ventricular ejection fraction, by estimation,  is 60 to 65%. The left ventricle has normal function. The left ventricle has no regional wall motion abnormalities. The left ventricular internal cavity size was normal in size. There is  mild left ventricular hypertrophy. Left ventricular diastolic parameters are consistent with Grade I diastolic dysfunction (impaired relaxation). Right Ventricle: The right ventricular size is normal. No increase in right ventricular wall thickness. Right ventricular systolic function is normal. There is normal pulmonary artery systolic pressure. The tricuspid regurgitant velocity is 2.33 m/s, and  with an assumed right atrial pressure of 8 mmHg, the estimated right ventricular systolic pressure is 97.6 mmHg. Left Atrium: Left atrial size was mildly dilated. Right Atrium: Right atrial size was normal in size. Pericardium: Trivial pericardial effusion is present. Mitral Valve: The mitral valve is degenerative in appearance. There is mild calcification of the mitral valve leaflet(s). Moderate mitral annular calcification. Trivial mitral valve regurgitation. No evidence of mitral valve stenosis. The mean mitral valve gradient is 3.0 mmHg. Tricuspid Valve: The tricuspid valve is normal in structure. Tricuspid valve regurgitation is trivial. Aortic Valve: The aortic valve is tricuspid. Aortic valve regurgitation is mild. Aortic regurgitation PHT measures 408  msec. Aortic valve sclerosis/calcification is present, without any evidence of aortic stenosis. Aortic valve peak gradient measures 10.5 mmHg. Pulmonic Valve: The pulmonic valve was normal in structure. Pulmonic valve regurgitation is not visualized. Aorta: Aortic dilatation noted. There is mild dilatation of the aortic root, measuring 43 mm. Venous: The inferior vena cava is normal in size with less than 50% respiratory variability, suggesting right atrial pressure of 8 mmHg. IAS/Shunts: No atrial level shunt detected by color flow Doppler.  LEFT VENTRICLE PLAX 2D LVIDd:         5.60 cm LVIDs:         3.20 cm LV PW:         1.40 cm LV IVS:        1.30 cm LVOT diam:     2.00 cm LV SV:         93 LV SV Index:   48 LVOT Area:     3.14 cm  RIGHT VENTRICLE          IVC RV Basal diam:  3.60 cm  IVC diam: 2.00 cm RV Mid diam:    4.20 cm TAPSE (M-mode): 2.6 cm LEFT ATRIUM           Index        RIGHT ATRIUM           Index LA diam:      3.60 cm 1.85 cm/m   RA Area:     15.20 cm LA Vol (A2C): 69.8 ml 35.80 ml/m  RA Volume:   35.00 ml  17.95 ml/m LA Vol (A4C): 68.4 ml 35.08 ml/m  AORTIC VALVE                 PULMONIC VALVE AV Area (Vmax): 2.75 cm     PV Vmax:       0.72 m/s AV Vmax:        162.00 cm/s  PV Peak grad:  2.1 mmHg AV Peak Grad:   10.5 mmHg LVOT Vmax:      142.00 cm/s LVOT Vmean:     84.400 cm/s LVOT VTI:       0.296 m AI PHT:         408 msec  AORTA Ao Root diam: 4.30 cm Ao Asc diam:  4.20 cm MITRAL VALVE  TRICUSPID VALVE MV Area (PHT): 2.80 cm     TR Peak grad:   21.7 mmHg MV Mean grad:  3.0 mmHg     TR Vmax:        233.00 cm/s MV Decel Time: 271 msec MV E velocity: 105.00 cm/s  SHUNTS MV A velocity: 151.00 cm/s  Systemic VTI:  0.30 m MV E/A ratio:  0.70         Systemic Diam: 2.00 cm Dalton McleanMD Electronically signed by Franki Monte Signature Date/Time: 09/22/2021/4:34:38 PM    Final    CT HEAD CODE STROKE WO CONTRAST  Result Date: 09/21/2021 CLINICAL DATA:  Code stroke. Initial  evaluation for acute neuro deficit, stroke suspected. EXAM: CT HEAD WITHOUT CONTRAST TECHNIQUE: Contiguous axial images were obtained from the base of the skull through the vertex without intravenous contrast. RADIATION DOSE REDUCTION: This exam was performed according to the departmental dose-optimization program which includes automated exposure control, adjustment of the mA and/or kV according to patient size and/or use of iterative reconstruction technique. COMPARISON:  None available. FINDINGS: Brain: Cerebral volume within normal limits for age. Chronic microvascular ischemic disease noted involving the supratentorial cerebral white matter. Multiple scattered superimposed remote lacunar infarcts present about the bilateral basal ganglia. Few small remote cerebellar infarcts noted. No acute intracranial hemorrhage. No acute large vessel territory infarct. No mass lesion or midline shift. No hydrocephalus or extra-axial fluid collection. Vascular: No hyperdense vessel. Scattered vascular calcifications noted within the carotid siphons. Skull: Scalp soft tissues and calvarium within normal limits. Sinuses/Orbits: Prior ocular lens replacement on the left. Globes and orbital soft tissues demonstrate no acute finding. Scattered mucoperiosteal thickening present throughout the paranasal sinuses. No air-fluid levels to suggest acute sinusitis. No mastoid effusion. Other: None. ASPECTS [facility] Stroke Program Early CT Score) - Ganglionic level infarction (caudate, lentiform nuclei, internal capsule, insula, M1-M3 cortex): 7 - Supraganglionic infarction (M4-M6 cortex): 3 Total score (0-10 with 10 being normal): 10 IMPRESSION: 1. No acute intracranial abnormality. 2. ASPECTS is 10. 3. Chronic microvascular ischemic disease with multiple remote lacunar infarcts about the bilateral basal ganglia and cerebellum. These results were communicated to Dr. Rory Percy at 7:38 pm on 09/21/2021 by text page via the [facility] messaging  system. Electronically Signed   By: Jeannine Boga M.D.   On: 09/21/2021 19:39   CT ANGIO HEAD NECK W WO CM (CODE STROKE)  Result Date: 09/21/2021 CLINICAL DATA:  Initial evaluation for neuro deficit, stroke. EXAM: CT ANGIOGRAPHY HEAD AND NECK TECHNIQUE: Multidetector CT imaging of the head and neck was performed using the standard protocol during bolus administration of intravenous contrast. Multiplanar CT image reconstructions and MIPs were obtained to evaluate the vascular anatomy. Carotid stenosis measurements (when applicable) are obtained utilizing NASCET criteria, using the distal internal carotid diameter as the denominator. RADIATION DOSE REDUCTION: This exam was performed according to the departmental dose-optimization program which includes automated exposure control, adjustment of the mA and/or kV according to patient size and/or use of iterative reconstruction technique. CONTRAST:  114mL OMNIPAQUE IOHEXOL 350 MG/ML SOLN COMPARISON:  Head CT from earlier the same day. FINDINGS: CTA NECK FINDINGS Aortic arch: Visualized aortic arch normal in caliber with normal branch pattern. Moderate atheromatous change about the arch. No hemodynamically significant stenosis about the origin the great vessels. Right carotid system: Right CCA patent from its origin to the bifurcation without stenosis. Moderate calcified plaque about the right carotid bulb/proximal right ICA without hemodynamically significant stenosis. Right ICA tortuous but widely patent distally without stenosis or  dissection. Left carotid system: Left CCA patent from its origin to the bifurcation without stenosis. Moderate atheromatous change about the left carotid bulb/proximal left ICA without hemodynamically significant stenosis. Left ICA tortuous but widely patent distally without stenosis or dissection. Vertebral arteries: Both vertebral arteries arise from the subclavian arteries. No proximal subclavian artery stenosis. Atheromatous  change about the origins of both vertebral arteries with associated severe ostial stenosis on the right. Vertebral arteries mildly tortuous but otherwise patent distally without stenosis or dissection. Skeleton: Reversal of the normal cervical lordosis with advanced spondylosis at C4-5 through C6-7. No discrete or worrisome osseous lesions. Other neck: No other acute soft tissue abnormality within the neck. Upper chest: Visualized upper chest demonstrates no acute finding. Mildly prominent shotty subcentimeter mediastinal lymph nodes noted. Review of the MIP images confirms the above findings CTA HEAD FINDINGS Anterior circulation: Petrous segments patent bilaterally. Atheromatous change within the carotid siphons without hemodynamically significant stenosis. A1 segments patent bilaterally. Normal anterior communicating artery complex. Anterior cerebral arteries patent without stenosis. No M1 stenosis or occlusion. Normal MCA bifurcations. No proximal MCA branch occlusion. To distal small vessel atheromatous irregularity. Posterior circulation: Both V4 segments patent without significant stenosis. Both PICA origins patent. Basilar patent to its distal aspect without stenosis. Superior cerebellar arteries patent bilaterally. Left PCA supplied via the basilar. Right PCA supplied via a hypoplastic right P1 segment and prominent right posterior communicating artery. With PCAs patent to their distal aspects without stenosis. Venous sinuses: Patent allowing for timing the contrast bolus. Anatomic variants: None significant.  No aneurysm. Review of the MIP images confirms the above findings IMPRESSION: 1. Negative CTA for emergent large vessel occlusion. 2. Atheromatous change about the origin of the right vertebral artery with associated severe ostial stenosis. 3. Moderate atherosclerotic change about the carotid bifurcations and carotid siphons without hemodynamically significant stenosis. 4. Diffuse tortuosity of the  major arterial vasculature of the head and neck, suggesting chronic underlying hypertension. 5.  Aortic Atherosclerosis (ICD10-I70.0). Electronically Signed   By: Jeannine Boga M.D.   On: 09/21/2021 20:09      HISTORY OF PRESENT ILLNESS Mr. Tivon Lemoine is a 76 y.o. male with history of arthritis, HTN, prediabetes and recent left wrist carpal tunnel syndrome presenting with sudden onset of left sided facial weakness, left arm and leg weakness and slurred speech.  EMS was called and patient was brought to the ED.  TNK was administered.  Of note, patient's ROM and strength in his LUE are limited due to recent carpal tunnel surgery and a recent fall with shoulder injury. MRI and follow up CT show IPH, likely hemorrhagic transformation. Keep BP less than 140. Start lisinopril and hydrochlorothiazide for BP management.   HOSPITAL COURSE Stroke: Right MCA infarct with hemorrhagic transformation status post TN K, embolic pattern, source unclear Code Stroke CT head No acute abnormality. Small vessel disease. ASPECTS 10.    Repeat CT- A 3.2 x 1.4 x 2.2 cm intraparenchymal hematoma in the posterior right frontal lobe with surrounding vasogenic edema. CTA head & neck severe ostial stenosis at origin of right vertebral artery, diffuse tortuosity of major arterial vasculature of the head MRI  Interval development of an acute intraparenchymal hematoma measuring 2.5 x 2.5 x 2.9 cm at the posterior right frontal region, estimated volume 9 mL. Associated localized vasogenic edema without significant regional mass effect or midline shift. Finding presumably reflects an ischemic stroke with hemorrhagic transformation 2D Echo EF- 60-65% Recommend 30-day cardiac event monitoring for loop recorder as  outpatient to rule out A. fib.  Patient currently refused given cost/co-pay.  He will discuss with his PCP in [place] LDL 137 HgbA1c 6.4 VTE prophylaxis - SCDs No antithrombotic prior to admission, now on No  antithrombotic due to hemorrhagic transformation.  Therapy recommendations:  Outpatient SLP- arranged with [facility]  Disposition:  Home, need follow-up with neurology in 2 weeks due to hemorrhagic transformation   Hypertension Home meds:  chlorthalidone 25 mg daily, Lisinopril Stable Long-term BP goal normotensive   Hyperlipidemia Home meds:  none LDL 137, goal < 70 Add zetia 10 mg daily  High intensity statin not indicated due to statin intolerance Consider lipid clinic referral   Pre-Diabetes type II  Home meds:  none HgbA1c 6.4, goal < 7.0 CBGs SSI   Other Stroke Risk Factors Advanced Age >/= 18  Former Cigarette smoker   Other Active Problems Recent left shoulder injury Patient has outpatient MRI scheduled Recent left carpal tunnel surgery Follow up with orthopedic physician  DISCHARGE EXAM Blood pressure 131/68, pulse 78, temperature 98.3 F (36.8 C), temperature source Oral, resp. rate 20, weight 81.2 kg, SpO2 95 %.  PHYSICAL EXAM General: Alert, well-developed, well nourished male in no acute distress.  Surgical wrap on left wrist with some swelling noted.     NEURO:  Mental Status: AA&Ox3  Speech/Language: speech is without dysarthria or aphasia.  Naming, repetition, fluency, and comprehension intact.   Cranial Nerves:  II: PERRL. Visual fields full.  III, IV, VI: EOMI. Eyelids elevate symmetrically.  V: Sensation is intact to light touch and symmetrical to face.  VII: Slight left facial droop VIII: hearing intact to voice. IX, X: Phonation is normal.  XII: tongue is midline without fasciculations. Motor: 5/5 strength to RUE, RLE and LLE.  2/5 strength in LUE Sensation- Intact to light touch bilaterally.  Coordination: No drift in RUE, unable to perform on left Gait- deferred  DISCHARGE PLAN Disposition:  Home No antithrombic for secondary stroke prevention given hemorrhagic transformation. Consider starting when hemorrhage resolves.  Follow-up  PCP [name], DO in 2 weeks. Follow-up in [facility] in 2 weeks, office to schedule an appointment.  Follow- up with Cardiology to get a 30 day monitor.   40 minutes were spent preparing discharge.  Patient seen and examined by NP/APP with MD. MD to update note as needed.   Janine Ores, DNP, FNP-BC [facility] Pager: [phone]  ATTENDING NOTE: I reviewed above note and agree with the assessment and plan. Pt was seen and examined.   Patient lying in bed, no acute event overnight, no complaints, will extremities, still has mild left facial droop and dysarthria.  Left hand swollen with bandage, however able to move fingers but with decreased sensation.  He stated that no change from before and he is waiting for follow-up with orthopedics next week.  CT and MRI showed stable right MCA hemorrhagic infarct and a hematoma.  However asymptomatic so far.  Etiology for stroke unclear, concerning for cardioembolic source.  Recommend cardiac monitoring as outpatient, however patient currently declined given possible cost/co-pay, he will discuss with his PCP in [place].  No antiplatelet will be given due to hemorrhagic infarct.  But will set up 2-week neurology follow-up at [facility].  Continue Zetia.  Okay to discharge today.  For detailed assessment and plan, please refer to above as I have made changes wherever appropriate.   Rosalin Hawking, MD PhD Stroke Neurology 09/23/2021 8:15 PM

## 2021-09-23 NOTE — Progress Notes (Signed)
BLE venous duplex has been completed.    Results can be found under chart review under CV PROC. 09/23/2021 3:09 PM Romualdo Prosise RVT, RDMS

## 2021-09-23 NOTE — TOC Transition Note (Signed)
Transition of Care [facility]) - CM/SW Discharge Note   Patient Details  Name: Richard Austin MRN: [id number] Date of Birth: August 15, 1946  Transition of Care [facility]) CM/SW Contact:  Pollie Friar, RN Phone Number: 09/23/2021, 2:17 PM   Clinical Narrative:    Patient is from home with his spouse. He is going to get a PCP arranged through the [facility]. He has appt on Wednesday to get this arranged.  Pt denies issues with transportation or home medications.  Outpatient speech arranged with [facility]. Information on the AVS.  Pt has transport home.    Final next level of care: OP Rehab Barriers to Discharge: No Barriers Identified   Patient Goals and CMS Choice     Choice offered to / list presented to : Patient  Discharge Placement                       Discharge Plan and Services                                     Social Determinants of Health (SDOH) Interventions     Readmission Risk Interventions No flowsheet data found.

## 2021-09-24 DIAGNOSIS — M79642 Pain in left hand: Secondary | ICD-10-CM | POA: Diagnosis not present

## 2021-09-24 DIAGNOSIS — M25531 Pain in right wrist: Secondary | ICD-10-CM | POA: Insufficient documentation

## 2021-09-26 ENCOUNTER — Encounter: Payer: Self-pay | Admitting: *Deleted

## 2021-09-26 ENCOUNTER — Other Ambulatory Visit: Payer: Self-pay | Admitting: *Deleted

## 2021-09-26 DIAGNOSIS — E785 Hyperlipidemia, unspecified: Secondary | ICD-10-CM | POA: Insufficient documentation

## 2021-09-26 DIAGNOSIS — J309 Allergic rhinitis, unspecified: Secondary | ICD-10-CM | POA: Insufficient documentation

## 2021-09-26 DIAGNOSIS — N1831 Chronic kidney disease, stage 3a: Secondary | ICD-10-CM | POA: Insufficient documentation

## 2021-09-26 DIAGNOSIS — N529 Male erectile dysfunction, unspecified: Secondary | ICD-10-CM | POA: Insufficient documentation

## 2021-09-26 DIAGNOSIS — M1990 Unspecified osteoarthritis, unspecified site: Secondary | ICD-10-CM | POA: Insufficient documentation

## 2021-09-26 DIAGNOSIS — C679 Malignant neoplasm of bladder, unspecified: Secondary | ICD-10-CM | POA: Insufficient documentation

## 2021-09-26 DIAGNOSIS — K219 Gastro-esophageal reflux disease without esophagitis: Secondary | ICD-10-CM | POA: Insufficient documentation

## 2021-09-26 DIAGNOSIS — E119 Type 2 diabetes mellitus without complications: Secondary | ICD-10-CM | POA: Insufficient documentation

## 2021-09-26 NOTE — Patient Outreach (Addendum)
[facility]) Care Management Telephonic RN Care Manager Note   09/26/2021 Name:  Richard Austin MRN:  [id number] DOB:  Feb 20, 1946  Summary: Follow up outreach to patient for EMMI red alert related scheduled appointment Richard Austin confirms he is pending [facility] this [facility] but has outpatient rehab scheduled. He is also pending scheduling appointment with local pcp, [name] related to the holiday 21/16/23  He will outreach to RN CM if assist is needed  He & wife Theophilus Kinds agree  to follow up  Recommendations/Changes made from today's visit: Recommend he update local pcp and RN CM to assist with sending copy of records/notes to [facility] prn  Health Information Management offices: [phone]  [email or web address]. Provided to him Discussed the Designated Party Release [facility]) form for Mrs Cimo to have access to his medical record   Subjective: Richard Austin is an 76 y.o. year old male who is a primary patient of [name], DO. The care management team was consulted for assistance with care management and/or care coordination needs.   Richard Austin was referred to [facility] on 09/26/21 after at 09/23/21 outreach red alert indicated concerns with scheduled appointments Richard Grime is a veteran with agent orange exposure. He confirms he is disabled from bladder cancer   Telephonic RN Care Manager completed Telephone Visit today.   Objective:  Medications Reviewed Today     Reviewed by Tollie Pizza, CPhT (Pharmacy Technician) on 09/22/21 at [phone]  Med List Status: Complete   Medication Order Taking? Sig Documenting Provider Last Dose Status Informant  cefdinir (OMNICEF) 300 MG capsule [id number] No Take 1 capsule (300 mg total) by mouth 2 (two) times daily.  Patient not taking: Reported on 09/22/2021   Irine Seal, MD Completed Course Active Self  chlorthalidone (HYGROTON) 25 MG tablet [id number]  Yes Take 25 mg by mouth every morning. [provider] 09/21/2021 Active Self  fluticasone (FLONASE) 50 MCG/ACT nasal spray [id number] Yes Place 2 sprays into both nostrils 2 (two) times daily as needed for allergies or rhinitis.  [provider] Past Month Active Self  HYDROcodone-acetaminophen (NORCO/VICODIN) 5-325 MG tablet [id number] Yes Take 1 tablet by mouth every 8 (eight) hours as needed for pain. [provider] 09/21/2021 Active Self  ibuprofen (ADVIL) 800 MG tablet [id number] Yes Take 800 mg by mouth every morning. [provider] Past Week Active Self  Krill Oil 500 MG CAPS [id number] Yes Take 500 mg by mouth at bedtime. [provider] Past Week Active Self  lisinopril (PRINIVIL,ZESTRIL) 40 MG tablet [id number] Yes Take 40 mg by mouth every morning. [provider] 09/21/2021 Active Self  Multiple Vitamin (MULTIVITAMIN) tablet [id number] Yes Take 1 tablet by mouth daily. [provider] 09/21/2021 Active Self  omeprazole (PRILOSEC) 40 MG capsule [id number] Yes Take 40 mg by mouth at bedtime. [provider] Past Week Active Self  Polyvinyl Alcohol-Povidone PF (REFRESH) 1.4-0.6 % SOLN [id number] Yes Place 1 drop into both eyes daily as needed (dry eyes). [provider] Past Week Active Self  tadalafil (CIALIS) 20 MG tablet [id number] Yes Take 20 mg by mouth once as needed (very high blood pressure with fluctuations). [provider] unk Active Self  tamsulosin (FLOMAX) 0.4 MG CAPS capsule [id number] Yes Take 0.4 mg by mouth at bedtime. 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 09/21/2021   Injury of left shoulder 09/19/2021   Abscess of groin, right 07/19/2021   Subcutaneous abscess 07/18/2021   Numbness of hand 07/13/2021   Spinal stenosis in cervical region 05/22/2021   Neck pain 03/07/2021   Benign prostatic hyperplasia without lower urinary tract symptoms 03/02/2020   Diverticulosis of large intestine 03/02/2020   SBO (small bowel obstruction) ([place]) 07/06/2018   AKI (acute kidney injury) ([place]) 07/06/2018   Hyperglycemia 07/06/2018   Hypertension 07/06/2018   Leukocytosis 07/06/2018   Bladder mass 07/06/2018   Pericardial effusion 07/06/2018   Hypercalcemia 07/06/2018     SDOH:  (Social Determinants of Health) assessments and interventions performed:  SDOH Interventions    Flowsheet Row Most Recent Value  SDOH Interventions   Food Insecurity Interventions Intervention Not Indicated  Financial Strain Interventions Intervention Not Indicated  Housing Interventions Intervention Not Indicated  Intimate Partner Violence Interventions Intervention Not Indicated  Stress Interventions Other (Comment)  Transportation Interventions Intervention Not Indicated       Care Plan  Review of patient past medical history, allergies, medications, health status, including review of consultants reports, laboratory and other test data, was performed as part of comprehensive evaluation for care management services.   Care Plan : RN Care Manager Plan of Care  Updates made by Barbaraann Faster, RN since 09/26/2021 12:00 AM     Problem: Complex Care Coordination Needs and disease management in patient with stroke, HTN, DM   Priority: High  Onset Date: 09/26/2021     Long-Range Goal: Establish Plan of Care for Management Complex SDOH Barriers, disease management and  Care Coordination Needs in patient with stroke, HTN, DM   Start Date: 09/26/2021  This Visit's Progress: On track  Priority: High  Note:   Current Barriers:  Knowledge Deficits related to plan of care for management of HTN, DMII, and stroke  Care Coordination needs related to Limited education about stroke, HTN, DMII HTN*  RNCM Clinical Goal(s):  Patient will verbalize understanding of plan for management of HTN, DMII, and stroke as evidenced by voiced improved BP, HgA1c,decrease risk for re admission in next 30 business days  through collaboration with Consulting civil engineer, provider, and care team.   Interventions: Further outreach for care coordination, disease management/education Inter-disciplinary care team collaboration (see longitudinal plan of care) Evaluation of current treatment plan related to  self management and patient's adherence to plan as established by provider   Diabetes Interventions:  (Status:  New goal. and Condition stable.  Not addressed this visit.) Short Term Goal Assessed patient's understanding of A1c goal: <7% Discussed plans with patient for ongoing care management follow up and provided patient with direct contact information for care management team Lab Results  Component Value Date   HGBA1C 6.4 (H) 09/22/2021   Hypertension Interventions:  (Status:  New goal.) Long Term Goal Last practice recorded BP readings:  BP Readings from Last 3 Encounters:  09/23/21 126/65  07/19/21 115/75  08/21/18 (!) 167/87  Most recent eGFR/CrCl: No results found for: EGFR  No components found for: CRCL  Evaluation  of current treatment plan related to hypertension self management and patient's adherence to plan as established by provider  Stroke:  (Status:New goal.) Long Term Goal Reviewed Importance of attending all scheduled provider appointments  Patient Goals/Self-Care Activities: Take all medications as prescribed Attend all scheduled provider appointments Perform  all self care activities independently  Perform IADL's (shopping, preparing meals, housekeeping, managing finances) independently Call provider office for new concerns or questions   Follow Up Plan:  The patient has been provided with contact information for the care management team and has been advised to call with any health related questions or concerns.  The care management team will reach out to the patient again over the next 30 business  days.       Plan: The patient has been provided with contact information for the care management team and has been advised to call with any health related questions or concerns.  The care management team will reach out to the patient again over the next 30 business days.  Tymber Stallings L. Lavina Hamman, RN, BSN, [place] Coordinator Office number [phone] [facility] number [phone] Fax number [phone]

## 2021-09-26 NOTE — Patient Outreach (Signed)
Received a red flag Emmi stroke notification for Richard Austin.  I have assigned Joellyn Quails, RN to call for follow up and determine if there are any Case Management needs.   [place], [place], [place] Management [phone]

## 2021-09-27 ENCOUNTER — Other Ambulatory Visit: Payer: Self-pay

## 2021-09-27 ENCOUNTER — Ambulatory Visit: Payer: Medicare Other | Attending: Neurology

## 2021-09-27 DIAGNOSIS — R471 Dysarthria and anarthria: Secondary | ICD-10-CM | POA: Insufficient documentation

## 2021-09-27 DIAGNOSIS — G5601 Carpal tunnel syndrome, right upper limb: Secondary | ICD-10-CM | POA: Diagnosis not present

## 2021-09-27 NOTE — Patient Instructions (Signed)
Purpose: To improve lip and tongue strength in order to help people understand you. These exercises can be done while looking in a mirror.  Do them twice a day.  Lips  1. Press hard and briefly hold all the beginning sounds in these words: Repeat each set 2 times     ma ma ma    "boo boo boo"  pa pa pa.          moo moo moo "bye bye bye"  pie pie pie          me me me  "bee bee bee"  pea pea pea   2. Say pick, book, pike, boot, and boat", "beak".   Repeat _3__ times.     Say  "make", "might", "moot", "meet", mike", "meek".  Repeat _3__ times.  3. Pucker your lips tightly and say OOOO, then smile wide and say EEEE.  Repeat _2_ times.  4. Practice whistling for __30__ seconds.   5. Blow kisses - make them LOUD!  Repeat __10__ times.   Tip of Tongue  1. Say the sound  ta ta ta, la la la,          and Engineering geologist.  Repeat __2__ times.               tee tee tee [employer]   dee dee dee        too too too [place]  do do do  2. Say time, took, take, town, and [place].  Repeat __2__ times.  3. Say long, look, low, lay and lie.  Repeat _2__ times.  4. Say name, neck, not, new, and no.  Repeat __2_ times.  5. Say down, dip, dot, Don, and do.  Repeat __2__ times   Back of Tongue  1. Say the sounds ka ka ka and go go go.   Repeat ___2_ times.       cow cow cow guy guy guy       koo koo koo goo goo goo  2. Say kick, cake, [employer], key, keep, kite, and [place].  Repeat __2__ times.  3. Say gate, gag, got, get, gone, and go.  Repeat __2__ times.

## 2021-09-28 NOTE — Therapy (Signed)
[facility] [address] [place], [place], [address] Phone: [phone]   Fax:  [phone]  Speech Language Pathology Evaluation  Patient Details  Name: Richard Austin MRN: [id number] Date of Birth: Nov 13, 1945 No data recorded  Encounter Date: 09/27/2021   End of Session - 09/28/21 0004     Visit Number 1    Number of Visits 5    Date for SLP Re-Evaluation 11/04/21    SLP Start Time 1536    SLP Stop Time  9150    SLP Time Calculation (min) 39 min    Activity Tolerance Patient tolerated treatment well             Past Medical History:  Diagnosis Date   Arthritis    Bladder tumor    Diverticulitis    Diverticulosis    Hypertension    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 2019   Pre-diabetes     Past Surgical History:  Procedure Laterality Date   ABDOMINAL SURGERY     ABLATION FOR SVT  2000 OR 2001   APPENDECTOMY  1970s   COLOSTOMY     LATER REVERSED   IRRIGATION AND DEBRIDEMENT ABSCESS N/A 07/18/2021   Procedure: IRRIGATION AND DEBRIDEMENT ABSCESS;  Surgeon: Irine Seal, MD;  Location: [facility];  Service: Urology;  Laterality: N/A;   LUMBAR LAMINECTOMY  1970s   NESBIT PROCEDURE N/A    TRANSURETHRAL RESECTION OF BLADDER TUMOR WITH MITOMYCIN-C N/A 08/21/2018   Procedure: TRANSURETHRAL RESECTION OF BLADDER TUMOR WITH GEMCITABINE;  Surgeon: Lucas Mallow, MD;  Location: [facility];  Service: Urology;  Laterality: N/A;    There were no vitals filed for this visit.   Subjective Assessment - 09/27/21 1542     Subjective "The last two days ago my speech has been much better."    Currently in Pain? No/denies                SLP Evaluation [facility] - 09/27/21 1539       Oral Motor/Sensory Function   Overall Oral Motor/Sensory Function Impaired    Labial ROM Reduced left    Labial Symmetry Abnormal symmetry left    Labial Strength Reduced Left    Labial Sensation Reduced Left   reports sometimes small  bits food  present and he is unaware   Labial Coordination WFL    Lingual ROM Within Functional Limits    Lingual Symmetry Within Functional Limits    Lingual Strength Within Functional Limits    Lingual Coordination Reduced    Overall Oral Motor/Sensory Function Pt exhibits reduced lt labial ROM affecting his speech clarity - intelligibility is 100%. Pt would like to have 2-3 follow up appointments to verify progress is continuing to occur.      Motor Speech   Overall Motor Speech Appears within functional limits for tasks assessed   pt states speech is currently at 85% of baseline   Respiration Within functional limits    Phonation Normal    Resonance Within functional limits    Articulation Impaired    Level of Impairment Sentence    Intelligibility Intelligible    Effective Techniques Over-articulate   pt clarity of speech increased to undetectable dysarthria                            SLP Education - 09/28/21 0002     Education Details HEP proceduer and rationale, deficits,  probable goals    Person(s) Educated Patient    Methods Explanation;Demonstration;Verbal cues;Handout    Comprehension Verbalized understanding;Returned demonstration;Verbal cues required;Need further instruction                SLP Long Term Goals - 09/28/21 0008       SLP LONG TERM GOAL #1   Title pt will perform dysphagia HEP with rare min A x2 sessions    Time 4    Period Weeks    Status New    Target Date 11/04/21      SLP LONG TERM GOAL #2   Title pt will participate in 15 minutes mod complex/complex conversation with speech precision/clarity x2 sessions    Time 4    Period Weeks    Status New    Target Date 11/04/21              Plan - 09/28/21 0005     Clinical Impression Statement Richard Austin") presents today with difficulties in speech clarity. Speech intelligibility is 100%. He desires home exercises and practice on speech compensations. He  reports he uses compensations for memory at times, successfully. Today pt exhibited WNL cognition during evaluation, judged subjectively from this SLP. He would benefit from skilled ST targeting improving speech clarity.    Speech Therapy Frequency 1x /week    Duration 4 weeks    Treatment/Interventions SLP instruction and feedback;Compensatory strategies;Patient/family education;Oral motor exercises    Potential to Achieve Goals Good             Patient will benefit from skilled therapeutic intervention in order to improve the following deficits and impairments:   Dysarthria and anarthria    Problem List Patient Active Problem List   Diagnosis Date Noted   Allergic rhinitis 09/26/2021   Bladder cancer ([place]) 09/26/2021   Chronic renal failure, stage 3a ([place]) 09/26/2021   Dyslipidemia 09/26/2021   Erectile dysfunction 09/26/2021   Gastroesophageal reflux disease 09/26/2021   Osteoarthritis 09/26/2021   Type 2 diabetes mellitus without complications ([place]) 46/56/8127   Right wrist pain 09/24/2021   Pain of left hand 09/24/2021   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 09/21/2021   Injury of left shoulder 09/19/2021   Abscess of groin, right 07/19/2021   Subcutaneous abscess 07/18/2021   Numbness of hand 07/13/2021   Spinal stenosis in cervical region 05/22/2021   Neck pain 03/07/2021   Benign prostatic hyperplasia without lower urinary tract symptoms 03/02/2020   Diverticulosis of large intestine 03/02/2020   SBO (small bowel obstruction) ([place]) 07/06/2018   AKI (acute kidney injury) ([place]) 07/06/2018   Hyperglycemia 07/06/2018   Hypertension 07/06/2018   Leukocytosis 07/06/2018   Bladder mass 07/06/2018   Pericardial effusion 07/06/2018   Hypercalcemia 07/06/2018    Tauni Sanks, [place] 09/28/2021, 12:10 AM  [facility] [address] [place], [place], [address] Phone:  [phone]   Fax:  [phone]  Name: Richard Austin MRN: [id number] Date of Birth: 1946-06-26

## 2021-10-02 ENCOUNTER — Other Ambulatory Visit: Payer: Self-pay

## 2021-10-02 ENCOUNTER — Emergency Department (HOSPITAL_COMMUNITY): Payer: Medicare Other

## 2021-10-02 ENCOUNTER — Encounter (HOSPITAL_COMMUNITY): Payer: Self-pay

## 2021-10-02 ENCOUNTER — Observation Stay (HOSPITAL_COMMUNITY)
Admission: EM | Admit: 2021-10-02 | Discharge: 2021-10-03 | Disposition: A | Discharge status: Home / Self Care | Payer: Medicare Other | Attending: Internal Medicine | Admitting: Internal Medicine

## 2021-10-02 DIAGNOSIS — Z8551 Personal history of malignant neoplasm of bladder: Secondary | ICD-10-CM | POA: Insufficient documentation

## 2021-10-02 DIAGNOSIS — R7303 Prediabetes: Secondary | ICD-10-CM | POA: Insufficient documentation

## 2021-10-02 DIAGNOSIS — Z91040 Latex allergy status: Secondary | ICD-10-CM | POA: Insufficient documentation

## 2021-10-02 DIAGNOSIS — Z7982 Long term (current) use of aspirin: Secondary | ICD-10-CM | POA: Diagnosis not present

## 2021-10-02 DIAGNOSIS — M6281 Muscle weakness (generalized): Secondary | ICD-10-CM | POA: Diagnosis not present

## 2021-10-02 DIAGNOSIS — I6930 Unspecified sequelae of cerebral infarction: Secondary | ICD-10-CM

## 2021-10-02 DIAGNOSIS — I10 Essential (primary) hypertension: Secondary | ICD-10-CM | POA: Diagnosis not present

## 2021-10-02 DIAGNOSIS — R29818 Other symptoms and signs involving the nervous system: Secondary | ICD-10-CM | POA: Diagnosis not present

## 2021-10-02 DIAGNOSIS — N179 Acute kidney failure, unspecified: Secondary | ICD-10-CM | POA: Insufficient documentation

## 2021-10-02 DIAGNOSIS — R569 Unspecified convulsions: Secondary | ICD-10-CM | POA: Diagnosis not present

## 2021-10-02 DIAGNOSIS — Z79899 Other long term (current) drug therapy: Secondary | ICD-10-CM | POA: Insufficient documentation

## 2021-10-02 DIAGNOSIS — Z87891 Personal history of nicotine dependence: Secondary | ICD-10-CM | POA: Insufficient documentation

## 2021-10-02 DIAGNOSIS — Z20822 Contact with and (suspected) exposure to covid-19: Secondary | ICD-10-CM | POA: Insufficient documentation

## 2021-10-02 DIAGNOSIS — R29810 Facial weakness: Secondary | ICD-10-CM | POA: Diagnosis not present

## 2021-10-02 DIAGNOSIS — Z8673 Personal history of transient ischemic attack (TIA), and cerebral infarction without residual deficits: Secondary | ICD-10-CM | POA: Insufficient documentation

## 2021-10-02 DIAGNOSIS — E785 Hyperlipidemia, unspecified: Secondary | ICD-10-CM | POA: Diagnosis present

## 2021-10-02 DIAGNOSIS — I639 Cerebral infarction, unspecified: Secondary | ICD-10-CM

## 2021-10-02 DIAGNOSIS — R001 Bradycardia, unspecified: Secondary | ICD-10-CM | POA: Diagnosis not present

## 2021-10-02 DIAGNOSIS — R58 Hemorrhage, not elsewhere classified: Secondary | ICD-10-CM | POA: Diagnosis not present

## 2021-10-02 DIAGNOSIS — N1831 Chronic kidney disease, stage 3a: Secondary | ICD-10-CM | POA: Diagnosis present

## 2021-10-02 HISTORY — DX: Unspecified convulsions: R56.9

## 2021-10-02 LAB — RESP PANEL BY RT-PCR (FLU A&B, COVID) ARPGX2
Influenza A by PCR: NEGATIVE
Influenza B by PCR: NEGATIVE
SARS Coronavirus 2 by RT PCR: NEGATIVE

## 2021-10-02 LAB — COMPREHENSIVE METABOLIC PANEL
ALT: 18 U/L (ref 0–44)
AST: 18 U/L (ref 15–41)
Albumin: 3.8 g/dL (ref 3.5–5.0)
Alkaline Phosphatase: 96 U/L (ref 38–126)
Anion gap: 12 (ref 5–15)
BUN: 28 mg/dL — ABNORMAL HIGH (ref 8–23)
CO2: 20 mmol/L — ABNORMAL LOW (ref 22–32)
Calcium: 9.8 mg/dL (ref 8.9–10.3)
Chloride: 106 mmol/L (ref 98–111)
Creatinine, Ser: 1.38 mg/dL — ABNORMAL HIGH (ref 0.61–1.24)
GFR, Estimated: 53 mL/min — ABNORMAL LOW (ref >60)
Glucose, Bld: 102 mg/dL — ABNORMAL HIGH (ref 70–99)
Potassium: 4 mmol/L (ref 3.5–5.1)
Sodium: 138 mmol/L (ref 135–145)
Total Bilirubin: 0.4 mg/dL (ref 0.3–1.2)
Total Protein: 7.1 g/dL (ref 6.5–8.1)

## 2021-10-02 LAB — DIFFERENTIAL
Abs Immature Granulocytes: 0.04 10*3/uL (ref 0.00–0.07)
Basophils Absolute: 0 10*3/uL (ref 0.0–0.1)
Basophils Relative: 0 %
Eosinophils Absolute: 0.4 10*3/uL (ref 0.0–0.5)
Eosinophils Relative: 4 %
Immature Granulocytes: 0 %
Lymphocytes Relative: 17 %
Lymphs Abs: 1.7 10*3/uL (ref 0.7–4.0)
Monocytes Absolute: 0.9 10*3/uL (ref 0.1–1.0)
Monocytes Relative: 9 %
Neutro Abs: 6.9 10*3/uL (ref 1.7–7.7)
Neutrophils Relative %: 70 %

## 2021-10-02 LAB — I-STAT CHEM 8, ED
BUN: 29 mg/dL — ABNORMAL HIGH (ref 8–23)
Calcium, Ion: 1.21 mmol/L (ref 1.15–1.40)
Chloride: 108 mmol/L (ref 98–111)
Creatinine, Ser: 1.4 mg/dL — ABNORMAL HIGH (ref 0.61–1.24)
Glucose, Bld: 101 mg/dL — ABNORMAL HIGH (ref 70–99)
HCT: 38 % — ABNORMAL LOW (ref 39.0–52.0)
Hemoglobin: 12.9 g/dL — ABNORMAL LOW (ref 13.0–17.0)
Potassium: 4.1 mmol/L (ref 3.5–5.1)
Sodium: 140 mmol/L (ref 135–145)
TCO2: 21 mmol/L — ABNORMAL LOW (ref 22–32)

## 2021-10-02 LAB — CBC
HCT: 37.1 % — ABNORMAL LOW (ref 39.0–52.0)
Hemoglobin: 12.2 g/dL — ABNORMAL LOW (ref 13.0–17.0)
MCH: 29.4 pg (ref 26.0–34.0)
MCHC: 32.9 g/dL (ref 30.0–36.0)
MCV: 89.4 fL (ref 80.0–100.0)
Platelets: 344 10*3/uL (ref 150–400)
RBC: 4.15 MIL/uL — ABNORMAL LOW (ref 4.22–5.81)
RDW: 12.8 % (ref 11.5–15.5)
WBC: 10 10*3/uL (ref 4.0–10.5)
nRBC: 0 % (ref 0.0–0.2)

## 2021-10-02 LAB — APTT: aPTT: 29 seconds (ref 24–36)

## 2021-10-02 LAB — PROTIME-INR
INR: 0.9 (ref 0.8–1.2)
Prothrombin Time: 12.6 seconds (ref 11.4–15.2)

## 2021-10-02 LAB — CBG MONITORING, ED: Glucose-Capillary: 100 mg/dL — ABNORMAL HIGH (ref 70–99)

## 2021-10-02 MED ORDER — LORAZEPAM 2 MG/ML IJ SOLN
INTRAMUSCULAR | Status: AC
  Administered 2021-10-02: 2 mg
  Filled 2021-10-02: qty 1

## 2021-10-02 MED ORDER — CHLORHEXIDINE GLUCONATE 0.12% ORAL RINSE (MEDLINE KIT): 15.0000 mL | Freq: Two times a day (BID) | OROMUCOSAL | Status: DC

## 2021-10-02 MED ORDER — ORAL CARE MOUTH RINSE: 15.0000 mL | OROMUCOSAL | Status: DC

## 2021-10-02 MED ORDER — SODIUM CHLORIDE 0.9 % IV SOLN
75.0000 mL/h | INTRAVENOUS | Status: AC
  Administered 2021-10-02: 75 mL/h via INTRAVENOUS

## 2021-10-02 MED ORDER — LORAZEPAM 2 MG/ML IJ SOLN: 4.0000 mg | INTRAMUSCULAR | Status: DC | PRN

## 2021-10-02 MED ORDER — ONDANSETRON HCL 4 MG PO TABS: 4.0000 mg | ORAL_TABLET | Freq: Four times a day (QID) | ORAL | Status: DC | PRN

## 2021-10-02 MED ORDER — SODIUM CHLORIDE 0.9 % IV SOLN
2000.0000 mg | Freq: Once | INTRAVENOUS | Status: AC
  Administered 2021-10-02: 2000 mg via INTRAVENOUS
  Filled 2021-10-02: qty 20

## 2021-10-02 MED ORDER — SODIUM CHLORIDE 0.9% FLUSH: 3.0000 mL | Freq: Once | INTRAVENOUS | Status: DC

## 2021-10-02 MED ORDER — EZETIMIBE 10 MG PO TABS
10.0000 mg | ORAL_TABLET | Freq: Every day | ORAL | Status: DC
  Administered 2021-10-03: 10 mg via ORAL
  Filled 2021-10-02: qty 1

## 2021-10-02 MED ORDER — SENNOSIDES-DOCUSATE SODIUM 8.6-50 MG PO TABS: 1.0000 | ORAL_TABLET | Freq: Every evening | ORAL | Status: DC | PRN

## 2021-10-02 MED ORDER — LEVETIRACETAM IN NACL 500 MG/100ML IV SOLN
500.0000 mg | Freq: Two times a day (BID) | INTRAVENOUS | Status: DC
  Administered 2021-10-02 – 2021-10-03 (×2): 500 mg via INTRAVENOUS
  Filled 2021-10-02 (×3): qty 100

## 2021-10-02 MED ORDER — ONDANSETRON HCL 4 MG/2ML IJ SOLN: 4.0000 mg | Freq: Four times a day (QID) | INTRAMUSCULAR | Status: DC | PRN

## 2021-10-02 MED ORDER — ASPIRIN EC 81 MG PO TBEC
81.0000 mg | DELAYED_RELEASE_TABLET | Freq: Every day | ORAL | Status: DC
  Administered 2021-10-03: 81 mg via ORAL
  Filled 2021-10-02: qty 1

## 2021-10-02 NOTE — ED Provider Notes (Addendum)
[facility] Provider Note   CSN: [id number] Arrival date & time: 10/02/21  1734     History  No chief complaint on file.   Richard Austin is a 76 y.o. male.  Patient is a 76 year old male who presents as a code stroke.  He has a history of hypertension, hyperlipidemia and prediabetes.  He was recently admitted earlier this month for a right MCA stroke status post IV thrombolytics with hemorrhagic conversion.  He started about an hour prior to arrival having some jerking movements of his left arm and the left side of his face.  He was activated as a code stroke.  On arrival he seemed to be having some seizure activity in his left upper arm and left side of face.      Home Medications Prior to Admission medications   Medication Sig Start Date End Date Taking? Authorizing Provider  aspirin EC 81 MG tablet Take 81 mg by mouth daily. Swallow whole.    [provider]  chlorthalidone (HYGROTON) 25 MG tablet Take 25 mg by mouth every morning.    [provider]  ezetimibe (ZETIA) 10 MG tablet Take 1 tablet (10 mg total) by mouth daily. 09/24/21   Janine Ores, NP  fluticasone (FLONASE) 50 MCG/ACT nasal spray Place 2 sprays into both nostrils 2 (two) times daily as needed for allergies or rhinitis.     [provider]  HYDROcodone-acetaminophen (NORCO/VICODIN) 5-325 MG tablet Take 1 tablet by mouth every 8 (eight) hours as needed for pain. 09/20/21   [provider]  ibuprofen (ADVIL) 800 MG tablet Take 800 mg by mouth every morning. 04/10/21   [provider]  Javier Docker Oil 500 MG CAPS Take 500 mg by mouth at bedtime.    [provider]  lisinopril (PRINIVIL,ZESTRIL) 40 MG tablet Take 40 mg by mouth every morning.    [provider]  Multiple Vitamin (MULTIVITAMIN) tablet Take 1 tablet by mouth daily.    [provider]  omeprazole (PRILOSEC) 40 MG capsule Take 40 mg by mouth at  bedtime.    [provider]  Polyvinyl Alcohol-Povidone PF (REFRESH) 1.4-0.6 % SOLN Place 1 drop into both eyes daily as needed (dry eyes).    [provider]  tadalafil (CIALIS) 20 MG tablet Take 20 mg by mouth once as needed (very high blood pressure with fluctuations).    [provider]  tamsulosin (FLOMAX) 0.4 MG CAPS capsule Take 0.4 mg by mouth at bedtime.    [provider]      Allergies    Amlodipine, Amlodipine besylate, Nebivolol hcl, Statins, and Latex    Review of Systems   Review of Systems  Constitutional:  Negative for chills, diaphoresis, fatigue and fever.  HENT:  Negative for congestion, rhinorrhea and sneezing.   Eyes: Negative.   Respiratory:  Negative for cough, chest tightness and shortness of breath.   Cardiovascular:  Negative for chest pain and leg swelling.  Gastrointestinal:  Negative for abdominal pain, blood in stool, diarrhea, nausea and vomiting.  Genitourinary:  Negative for difficulty urinating, flank pain, frequency and hematuria.  Musculoskeletal:  Negative for arthralgias and back pain.  Skin:  Negative for rash.  Neurological:  Positive for seizures and weakness. Negative for dizziness, speech difficulty, numbness and headaches.   Physical Exam Updated Vital Signs BP 127/63    Pulse 62    Temp 98 F (36.7 C) (Oral)    Resp 14  Ht 5' 8"  (1.727 m)    Wt 81.2 kg    SpO2 95%    BMI 27.22 kg/m  Physical Exam Constitutional:      Appearance: He is well-developed.  HENT:     Head: Normocephalic and atraumatic.  Eyes:     Pupils: Pupils are equal, round, and reactive to light.  Cardiovascular:     Rate and Rhythm: Normal rate and regular rhythm.     Heart sounds: Normal heart sounds.  Pulmonary:     Effort: Pulmonary effort is normal. No respiratory distress.     Breath sounds: Normal breath sounds. No wheezing or rales.  Chest:     Chest wall: No tenderness.  Abdominal:     General: Bowel sounds are  normal.     Palpations: Abdomen is soft.     Tenderness: There is no abdominal tenderness. There is no guarding or rebound.  Musculoskeletal:        General: Normal range of motion.     Cervical back: Normal range of motion and neck supple.  Lymphadenopathy:     Cervical: No cervical adenopathy.  Skin:    General: Skin is warm and dry.     Findings: No rash.  Neurological:     Mental Status: He is alert and oriented to person, place, and time.     Comments: Rhythmic jerking noted to the left upper extremity and the left side of face    ED Results / Procedures / Treatments   Labs (all labs ordered are listed, but only abnormal results are displayed) Labs Reviewed  CBC - Abnormal; Notable for the following components:      Result Value   RBC 4.15 (*)    Hemoglobin 12.2 (*)    HCT 37.1 (*)    All other components within normal limits  COMPREHENSIVE METABOLIC PANEL - Abnormal; Notable for the following components:   CO2 20 (*)    Glucose, Bld 102 (*)    BUN 28 (*)    Creatinine, Ser 1.38 (*)    GFR, Estimated 53 (*)    All other components within normal limits  I-STAT CHEM 8, ED - Abnormal; Notable for the following components:   BUN 29 (*)    Creatinine, Ser 1.40 (*)    Glucose, Bld 101 (*)    TCO2 21 (*)    Hemoglobin 12.9 (*)    HCT 38.0 (*)    All other components within normal limits  CBG MONITORING, ED - Abnormal; Notable for the following components:   Glucose-Capillary 100 (*)    All other components within normal limits  RESP PANEL BY RT-PCR (FLU A&B, COVID) ARPGX2  PROTIME-INR  APTT  DIFFERENTIAL  BASIC METABOLIC PANEL  CBC    EKG EKG Interpretation  Date/Time:  Sunday October 02 2021 18:20:51 EST Ventricular Rate:  77 PR Interval:  143 QRS Duration: 90 QT Interval:  378 QTC Calculation: 428 R Axis:   3 Text Interpretation: Sinus rhythm Low voltage, precordial leads since last tracing no significant change Confirmed by Malvin Johns [phone]) on  10/02/2021 11:07:27 PM  Radiology CT HEAD CODE STROKE WO CONTRAST  Result Date: 10/02/2021 CLINICAL DATA:  Code stroke. A is cute neuro deficit. Stroke suspected. EXAM: CT HEAD WITHOUT CONTRAST TECHNIQUE: Contiguous axial images were obtained from the base of the skull through the vertex without intravenous contrast. RADIATION DOSE REDUCTION: This exam was performed according to the departmental dose-optimization program which includes automated exposure control, adjustment of  the mA and/or kV according to patient size and/or use of iterative reconstruction technique. COMPARISON:  CT head 09/23/2021.  MRI head 09/22/2021 FINDINGS: Brain: Hypodensity in the right frontal parietal lobe appears larger compared with the prior CT. Prior hemorrhage is now isodense to the brain. Findings most compatible with subacute infarct with hemorrhagic transformation. No new hemorrhage since the prior study. No midline shift. Ventricle size normal. Chronic microvascular ischemic change in the deep white matter. Vascular: Negative for hyperdense vessel Skull: Negative Sinuses/Orbits: Paranasal sinuses clear.  Left cataract extraction Other: Motion degraded study. IMPRESSION: 1. Progressive hypodensity right frontal parietal lobe compatible with evolving subacute infarct. Previously noted hemorrhagic transformation has resolved. No new area of hemorrhage. 2. Chronic microvascular ischemic change in the deep white matter. 3. Code stroke imaging results were communicated on 10/02/2021 at 5:55 pm to provider Lorrin Goodell via text page Electronically Signed   By: Franchot Gallo M.D.   On: 10/02/2021 17:56    Procedures Procedures    Medications Ordered in ED Medications  sodium chloride flush (NS) 0.9 % injection 3 mL (0 mLs Intravenous Hold 10/02/21 1820)  levETIRAcetam (KEPPRA) IVPB 500 mg/100 mL premix (0 mg Intravenous Stopped 10/02/21 1908)  chlorhexidine gluconate (MEDLINE KIT) (PERIDEX) 0.12 % solution 15 mL (0 mLs Mouth  Rinse Hold 10/02/21 1957)  MEDLINE mouth rinse (0 mLs Mouth Rinse Hold 10/02/21 2102)  0.9 %  sodium chloride infusion (75 mL/hr Intravenous New Bag/Given 10/02/21 2005)  LORazepam (ATIVAN) injection 4 mg (has no administration in time range)  ondansetron (ZOFRAN) tablet 4 mg (has no administration in time range)    Or  ondansetron (ZOFRAN) injection 4 mg (has no administration in time range)  senna-docusate (Senokot-S) tablet 1 tablet (has no administration in time range)  aspirin EC tablet 81 mg (has no administration in time range)  ezetimibe (ZETIA) tablet 10 mg (has no administration in time range)  LORazepam (ATIVAN) 2 MG/ML injection (2 mg  Given 10/02/21 1740)  LORazepam (ATIVAN) 2 MG/ML injection (2 mg  Given 10/02/21 1758)  levETIRAcetam (KEPPRA) 2,000 mg in sodium chloride 0.9 % 250 mL IVPB (0 mg Intravenous Stopped 10/02/21 1848)    ED Course/ Medical Decision Making/ A&P                           Medical Decision Making Amount and/or Complexity of Data Reviewed Independent Historian: EMS External Data Reviewed: notes. Labs: ordered. Decision-making details documented in ED Course. Radiology: ordered. Decision-making details documented in ED Course. ECG/medicine tests: ordered and independent interpretation performed. Decision-making details documented in ED Course.  Risk Parenteral controlled substances. Drug therapy requiring intensive monitoring for toxicity. Decision regarding hospitalization.   Patient is a 76 year old male who presents with seizure activity after admission for recent stroke.  He was given Ativan and Keppra.  His seizures stopped after this.  No other symptoms of an acute stroke.  Neurology has seen the patient and recommends admission for further monitoring.  His labs are reviewed and are nonconcerning.  His CT scan shows normal evolution of his prior stroke.  I spoke with the hospitalist, Dr. Posey Pronto who will admit the patient for further  treatment.  Final Clinical Impression(s) / ED Diagnoses Final diagnoses:  Seizure ([place])    Rx / DC Orders ED Discharge Orders          Ordered    Ambulatory referral to Neurology       Comments: An appointment is requested  in approximately: 4 weeks or as already scheduled.   He may already have f/up appt for stroke, but he returned to hospital on 10/02/21 with seizure.   10/02/21 1826              Malvin Johns, MD 10/02/21 Brown Human, MD 10/02/21 [phone]

## 2021-10-02 NOTE — Assessment & Plan Note (Signed)
Continue Zetia. °

## 2021-10-02 NOTE — ED Triage Notes (Signed)
Pt bib [facility] with Left arm twitching. Pt has left facial droop from previous stroke, no other deficits noted. CODE STROKE called due to LKW 1700 today. Pt AOx 4 on arrival  EMS vitals: 180/90, 96HR

## 2021-10-02 NOTE — Assessment & Plan Note (Signed)
BP currently stable, holding lisinopril and chlorthalidone as above.

## 2021-10-02 NOTE — H&P (Signed)
History and Physical    Richard Austin BWL:[id number] DOB: 07/26/1946 DOA: 10/02/2021  PCP: [name], DO  Patient coming from: Home via EMS  I have personally briefly reviewed patient's old medical records in [place]  Chief Complaint: Left arm shaking/jerking  HPI: Richard Austin is a 76 y.o. male with medical history significant for hypertension, hyperlipidemia, bladder cancer, and recent stroke who presented to the ED for evaluation of shaking of his left arm.  Patient unable to provide history due to sedation from Ativan and history is otherwise obtained from EDP, chart review, and patient's spouse at bedside.  Patient recently admitted 09/21/2021-09/23/2021 with left-sided weakness and facial droop.  He arrived as a code stroke and received TNK.  Follow-up brain imaging showed intraparenchymal hematoma in the posterior right frontal lobe with surrounding vasogenic edema.  Findings were felt to reflect right MCA infarct with hemorrhagic transformation s/p TNK.  Patient was noted to have residual left facial droop and left upper extremity weakness on discharge.  Patient spouse states that he was doing well earlier today.  He returned home after grocery shopping.  Around 1715, she heard him yell out to her for help.  She went upstairs and saw that he was having a persistent rhythmic uncontrollable left arm jerking movement.  EMS were called and he was brought to the ED for further evaluation.  ED Course:  Initial vitals showed BP 139/97, pulse 82, RR 23, temp 98.0 F, SPO2 96% on room air.  Labs showed sodium 138, potassium 4.0, bicarb 20, BUN 28, creatinine 1.38 (baseline 1.0-1.1), serum glucose 102, LFTs within normal limits, WBC 10.0, hemoglobin 12.2, platelets 344,000.  CT head without contrast shows progressive hypodensity right frontal parietal lobe compatible with evolving subacute infarct.  Previously noted hemorrhagic transformation has resolved, no new area of  hemorrhage seen.  Chronic microvascular ischemic change in the deep white matter noted.  Neurology were consulted.  Patient received total of 4 mg IV Ativan with resolution of seizure-like activity.  Patient was given IV Keppra 2 g load and started on 500 mg twice daily.  The hospitalist service was consulted to admit for further evaluation and management.  Review of Systems:  Unable to obtain full review of systems due to sedation.   Past Medical History:  Diagnosis Date   Arthritis    Bladder tumor    Diverticulitis    Diverticulosis    Hypertension    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 2019   Pre-diabetes     Past Surgical History:  Procedure Laterality Date   ABDOMINAL SURGERY     ABLATION FOR SVT  2000 OR 2001   APPENDECTOMY  1970s   COLOSTOMY     LATER REVERSED   IRRIGATION AND DEBRIDEMENT ABSCESS N/A 07/18/2021   Procedure: IRRIGATION AND DEBRIDEMENT ABSCESS;  Surgeon: Irine Seal, MD;  Location: [facility];  Service: Urology;  Laterality: N/A;   LUMBAR LAMINECTOMY  1970s   NESBIT PROCEDURE N/A    TRANSURETHRAL RESECTION OF BLADDER TUMOR WITH MITOMYCIN-C N/A 08/21/2018   Procedure: TRANSURETHRAL RESECTION OF BLADDER TUMOR WITH GEMCITABINE;  Surgeon: Lucas Mallow, MD;  Location: [facility];  Service: Urology;  Laterality: N/A;    Social History:  reports that he has quit smoking. His smoking use included cigarettes. He has a 45.00 pack-year smoking history. He has never used smokeless tobacco. He reports current alcohol use. He reports that he does not currently use drugs.  Allergies  Allergen Reactions  Amlodipine Other (See Comments)    Muscle tightness, fatigue    Amlodipine Besylate     Other reaction(s): Unknown   Nebivolol Hcl Other (See Comments)    Muscle tightness and fatigue  Other reaction(s): Unknown   Statins Other (See Comments)    Muscle tightness and fatigue  Other reaction(s): Unknown   Latex Rash    Family History  Problem Relation Age of  Onset   Peptic Ulcer Mother    Cirrhosis Mother    Alcohol abuse Mother    Bladder Cancer Father    Throat cancer Maternal Grandfather      Prior to Admission medications   Medication Sig Start Date End Date Taking? Authorizing Provider  aspirin EC 81 MG tablet Take 81 mg by mouth daily. Swallow whole.    [provider]  chlorthalidone (HYGROTON) 25 MG tablet Take 25 mg by mouth every morning.    [provider]  ezetimibe (ZETIA) 10 MG tablet Take 1 tablet (10 mg total) by mouth daily. 09/24/21   Janine Ores, NP  fluticasone (FLONASE) 50 MCG/ACT nasal spray Place 2 sprays into both nostrils 2 (two) times daily as needed for allergies or rhinitis.     [provider]  HYDROcodone-acetaminophen (NORCO/VICODIN) 5-325 MG tablet Take 1 tablet by mouth every 8 (eight) hours as needed for pain. 09/20/21   [provider]  ibuprofen (ADVIL) 800 MG tablet Take 800 mg by mouth every morning. 04/10/21   [provider]  Javier Docker Oil 500 MG CAPS Take 500 mg by mouth at bedtime.    [provider]  lisinopril (PRINIVIL,ZESTRIL) 40 MG tablet Take 40 mg by mouth every morning.    [provider]  Multiple Vitamin (MULTIVITAMIN) tablet Take 1 tablet by mouth daily.    [provider]  omeprazole (PRILOSEC) 40 MG capsule Take 40 mg by mouth at bedtime.    [provider]  Polyvinyl Alcohol-Povidone PF (REFRESH) 1.4-0.6 % SOLN Place 1 drop into both eyes daily as needed (dry eyes).    [provider]  tadalafil (CIALIS) 20 MG tablet Take 20 mg by mouth once as needed (very high blood pressure with fluctuations).    [provider]  tamsulosin (FLOMAX) 0.4 MG CAPS capsule Take 0.4 mg by mouth at bedtime.    [provider]    Physical Exam: Vitals:   10/02/21 1807 10/02/21 1808  BP: (!) 139/97   Pulse: 81   Resp: (!) 23   Temp: 98 F (36.7 C)   TempSrc: Oral   SpO2: 96%   Weight:  81.2 kg   Height:  5\' 8"  (1.727 m)   Exam limited due to sedation. Constitutional: Resting supine in bed, hypersomnolent, difficult to arouse, snoring with intermittent apneic episodes Eyes: PERRL, lids and conjunctivae normal ENMT: Mucous membranes are moist. Posterior pharynx clear of any exudate or lesions.Normal dentition.  Neck: normal, supple, no masses. Respiratory: clear to auscultation anteriorly. Cardiovascular: Regular rate and rhythm, no murmurs / rubs / gallops. No extremity edema. 2+ pedal pulses. Abdomen: no tenderness, no masses palpated. No hepatosplenomegaly. Bowel sounds positive.  Musculoskeletal: no clubbing / cyanosis. No joint deformity upper and lower extremities. Skin: no rashes, lesions, ulcers. No induration Neurologic: Exam limited due to sedation, moves right upper extremity spontaneously grimaces to sternal rub Psychiatric: Hypersomnolent  Labs on Admission: I have personally reviewed following labs and imaging studies  CBC: Recent Labs  Lab 10/02/21 1740 10/02/21 1745  WBC 10.0  --  NEUTROABS 6.9  --   HGB 12.2* 12.9*  HCT 37.1* 38.0*  MCV 89.4  --   PLT 344  --    Basic Metabolic Panel: Recent Labs  Lab 10/02/21 1740 10/02/21 1745  NA 138 140  K 4.0 4.1  CL 106 108  CO2 20*  --   GLUCOSE 102* 101*  BUN 28* 29*  CREATININE 1.38* 1.40*  CALCIUM 9.8  --    GFR: Estimated Creatinine Clearance: 44.1 mL/min (A) (by C-G formula based on SCr of 1.4 mg/dL (H)). Liver Function Tests: Recent Labs  Lab 10/02/21 1740  AST 18  ALT 18  ALKPHOS 96  BILITOT 0.4  PROT 7.1  ALBUMIN 3.8   No results for input(s): LIPASE, AMYLASE in the last 168 hours. No results for input(s): AMMONIA in the last 168 hours. Coagulation Profile: Recent Labs  Lab 10/02/21 1740  INR 0.9   Cardiac Enzymes: No results for input(s): CKTOTAL, CKMB, CKMBINDEX, TROPONINI in the last 168 hours. BNP (last 3 results) No results for input(s): PROBNP in the last 8760  hours. HbA1C: No results for input(s): HGBA1C in the last 72 hours. CBG: Recent Labs  Lab 10/02/21 1738  GLUCAP 100*   Lipid Profile: No results for input(s): CHOL, HDL, LDLCALC, TRIG, CHOLHDL, LDLDIRECT in the last 72 hours. Thyroid Function Tests: No results for input(s): TSH, T4TOTAL, FREET4, T3FREE, THYROIDAB in the last 72 hours. Anemia Panel: No results for input(s): VITAMINB12, FOLATE, FERRITIN, TIBC, IRON, RETICCTPCT in the last 72 hours. Urine analysis:    Component Value Date/Time   COLORURINE AMBER (A) 07/06/2018 2252   APPEARANCEUR CLOUDY (A) 07/06/2018 2252   LABSPEC 1.017 07/06/2018 2252   PHURINE 5.0 07/06/2018 2252   GLUCOSEU NEGATIVE 07/06/2018 2252   HGBUR SMALL (A) 07/06/2018 2252   BILIRUBINUR NEGATIVE 07/06/2018 2252   KETONESUR NEGATIVE 07/06/2018 2252   PROTEINUR 30 (A) 07/06/2018 2252   NITRITE NEGATIVE 07/06/2018 2252   LEUKOCYTESUR LARGE (A) 07/06/2018 2252    Radiological Exams on Admission: CT HEAD CODE STROKE WO CONTRAST  Result Date: 10/02/2021 CLINICAL DATA:  Code stroke. A is cute neuro deficit. Stroke suspected. EXAM: CT HEAD WITHOUT CONTRAST TECHNIQUE: Contiguous axial images were obtained from the base of the skull through the vertex without intravenous contrast. RADIATION DOSE REDUCTION: This exam was performed according to the departmental dose-optimization program which includes automated exposure control, adjustment of the mA and/or kV according to patient size and/or use of iterative reconstruction technique. COMPARISON:  CT head 09/23/2021.  MRI head 09/22/2021 FINDINGS: Brain: Hypodensity in the right frontal parietal lobe appears larger compared with the prior CT. Prior hemorrhage is now isodense to the brain. Findings most compatible with subacute infarct with hemorrhagic transformation. No new hemorrhage since the prior study. No midline shift. Ventricle size normal. Chronic microvascular ischemic change in the deep white matter.  Vascular: Negative for hyperdense vessel Skull: Negative Sinuses/Orbits: Paranasal sinuses clear.  Left cataract extraction Other: Motion degraded study. IMPRESSION: 1. Progressive hypodensity right frontal parietal lobe compatible with evolving subacute infarct. Previously noted hemorrhagic transformation has resolved. No new area of hemorrhage. 2. Chronic microvascular ischemic change in the deep white matter. 3. Code stroke imaging results were communicated on 10/02/2021 at 5:55 pm to provider Lorrin Goodell via text page Electronically Signed   By: Franchot Gallo M.D.   On: 10/02/2021 17:56    EKG: Personally reviewed. Normal sinus rhythm without acute ischemic changes.  Assessment/Plan Principal Problem:   Seizure-like activity (HCC) Active Problems:  History of cerebrovascular accident (CVA) with residual deficit   AKI (acute kidney injury) ([place])   Hypertension   Dyslipidemia   Richard Austin is a 76 y.o. male with medical history significant for hypertension, hyperlipidemia, bladder cancer, and recent stroke who is admitted with seizure-like activity.  * Seizure-like activity [facility]) Patient presenting with rhythmic left upper extremity twitching.  Neurology consulted.  Findings felt to be focal seizure secondary to recent right MCA stroke with hemorrhagic transformation after TNK.  Seizure activity resolved after 4 mg of Ativan and 2 g Keppra. -Started on IV Keppra 500 mg twice daily per neurology -Routine EEG ordered -IV Ativan if needed for recurrent seizure  History of cerebrovascular accident (CVA) with residual deficit Recent admit for right MCA stroke with hemorrhagic transformation s/p TNKase.  Has residual slight left facial droop and left upper extremity weakness.  Repeat CT head shows evolving subacute infarct with resolution of prior hemorrhagic transformation. -Continue aspirin 81 g daily, zetia  AKI (acute kidney injury) ([place])- (present on admission) Mild in setting of  seizure activity.  Start on gentle IV fluid hydration overnight.  Hold lisinopril and chlorthalidone for now.  Hypertension- (present on admission) BP currently stable, holding lisinopril and chlorthalidone as above.  Dyslipidemia- (present on admission) Continue Zetia.   DVT prophylaxis: SCDs Start: 10/02/21 1910 Code Status: Full code Family Communication: Discussed with patient spouse at bedside Disposition Plan: From home and likely discharge to home pending clinical progress Consults called: Neurology Level of care: Telemetry Medical Admission status:  Status is: Observation  The patient remains OBS appropriate and will d/c before 2 midnights.  Zada Finders MD [facility]  If 7PM-7AM, please contact night-coverage [email or web address]  10/02/2021, 7:26 PM

## 2021-10-02 NOTE — Assessment & Plan Note (Signed)
Patient presenting with rhythmic left upper extremity twitching.  Neurology consulted.  Findings felt to be focal seizure secondary to recent right MCA stroke with hemorrhagic transformation after TNK.  Seizure activity resolved after 4 mg of Ativan and 2 g Keppra. -Started on IV Keppra 500 mg twice daily per neurology -Routine EEG ordered -IV Ativan if needed for recurrent seizure

## 2021-10-02 NOTE — Assessment & Plan Note (Signed)
Recent admit for right MCA stroke with hemorrhagic transformation s/p TNKase.  Has residual slight left facial droop and left upper extremity weakness.  Repeat CT head shows evolving subacute infarct with resolution of prior hemorrhagic transformation. -Continue aspirin 81 g daily, zetia

## 2021-10-02 NOTE — Assessment & Plan Note (Signed)
Mild in setting of seizure activity.  Start on gentle IV fluid hydration overnight.  Hold lisinopril and chlorthalidone for now.

## 2021-10-02 NOTE — Consult Note (Signed)
Neurology Consultation  Reason for Consult: Code stroke.  Referring Physician: Roxine Caddy., MD.   CC: came in as code stroke. Having a seizure.   History is obtained from: chart, EMS.   HPI: Bronte Kropf is a 76 y.o. male with a PMHx of recent stroke with TNK bleed, HTN, and diverticulitis who presents as a code stroke called in field by EMS. Wife saw patient normal at 1700 hours when he began to have shaking of his left arm and she called 911.   After brief exam on ED bridge for airway clearance, patient taken to CT. CT was positive for known old stroke. Stroke not suspected today, but his presentation does appear to be a seizure. He is shaking/jerking his left arm with high amplitude and intensity from the shoulder to the hand. States his arm is sore from the jerking. Never had a seizure before. His facial droop is sequelae from right MCA infarct 09/21/21. He received TNK and on MRI brain, there was hemorrhagic transformation.   Neurology consulted for code stroke and seizure activity.   ROS: A robust ROS was unable to be performed due to emergent issue of event.   Past Medical History:  Diagnosis Date   Arthritis    Bladder tumor    Diverticulitis    Diverticulosis    Hypertension    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 2019   Pre-diabetes    Family History  Problem Relation Age of Onset   Peptic Ulcer Mother    Cirrhosis Mother    Alcohol abuse Mother    Bladder Cancer Father    Throat cancer Maternal Grandfather   Seizures in grandson.   Social History:   reports that he has quit smoking. His smoking use included cigarettes. He has a 45.00 pack-year smoking history. He has never used smokeless tobacco. He reports current alcohol use. He reports that he does not currently use drugs.  Medications  Current Facility-Administered Medications:    LORazepam (ATIVAN) 2 MG/ML injection, , , ,    LORazepam (ATIVAN) 2 MG/ML injection, , , ,    sodium chloride flush (NS)  0.9 % injection 3 mL, 3 mL, Intravenous, Once, Malvin Johns, MD  Current Outpatient Medications:    aspirin EC 81 MG tablet, Take 81 mg by mouth daily. Swallow whole., Disp: , Rfl:    chlorthalidone (HYGROTON) 25 MG tablet, Take 25 mg by mouth every morning., Disp: , Rfl:    ezetimibe (ZETIA) 10 MG tablet, Take 1 tablet (10 mg total) by mouth daily., Disp: 30 tablet, Rfl: 1   fluticasone (FLONASE) 50 MCG/ACT nasal spray, Place 2 sprays into both nostrils 2 (two) times daily as needed for allergies or rhinitis. , Disp: , Rfl:    HYDROcodone-acetaminophen (NORCO/VICODIN) 5-325 MG tablet, Take 1 tablet by mouth every 8 (eight) hours as needed for pain., Disp: , Rfl:    ibuprofen (ADVIL) 800 MG tablet, Take 800 mg by mouth every morning., Disp: , Rfl:    Krill Oil 500 MG CAPS, Take 500 mg by mouth at bedtime., Disp: , Rfl:    lisinopril (PRINIVIL,ZESTRIL) 40 MG tablet, Take 40 mg by mouth every morning., Disp: , Rfl:    Multiple Vitamin (MULTIVITAMIN) tablet, Take 1 tablet by mouth daily., Disp: , Rfl:    omeprazole (PRILOSEC) 40 MG capsule, Take 40 mg by mouth at bedtime., Disp: , Rfl:    Polyvinyl Alcohol-Povidone PF (REFRESH) 1.4-0.6 % SOLN, Place 1 drop into both eyes daily as  needed (dry eyes)., Disp: , Rfl:    tadalafil (CIALIS) 20 MG tablet, Take 20 mg by mouth once as needed (very high blood pressure with fluctuations)., Disp: , Rfl:    tamsulosin (FLOMAX) 0.4 MG CAPS capsule, Take 0.4 mg by mouth at bedtime., Disp: , Rfl:   Exam: Gen: appears well but in some distress from seizure activity in NAD.  HEENT: AT, [place].  Card: RRR.  Resp: normal rate and effort.  Abdomen: soft, NT.  Extremities: warm.  Psych: calm and cooperative.   Current vital signs: T 98 F HR 81  RR 23  BP 139/97. SaO2 96% on RA.   NEURO:  Mental Status: Alert and appears oriented.  Speech/Language: speech is without dysarthria or aphasia.  Naming, repetition, fluency, and comprehension intact.  Cranial Nerves:   II: PERRL. Visual fields full.  III, IV, VI: EOMI. Lid elevation symmetric and full.  V: sensation is intact and symmetrical to face.  VII: Mild left facial droop.   VIII:hearing intact to voice. IX, X: palate elevation is symmetric. Phonation normal.  XI: normal sternocleidomastoid and trapezius muscle strength. WEX:HBZJIR is symmetrical without fasciculations.   Motor:  Able to move all extremities with normal strength with exception of LUE due to jerking.  Tone is normal. Bulk is normal.  Sensation- Intact to light touch bilaterally in all four extremities. Extinction absent to DSS.  Coordination: FTN intact on right. Seizure activity on Left.  HKS intact bilaterally.  Gait- deferred.  NIHSS:  1a Level of Consciousness: 0 1b LOC Questions: 0 1c LOC Commands: 0 2 Best Gaze: 0 3 Visual: 0 4 Facial Palsy: 1 5a Motor Arm - left: 0 5b Motor Arm - Right: 0 6a Motor Leg - Left: 0 6b Motor Leg - Right: 0 7 Limb Ataxia: 1 8 Sensory: 0 9 Best Language: 0 10 Dysarthria: 0 11 Extinction and Inattention: 0 TOTAL: 2  Labs I have reviewed labs in epic and the results pertinent to this consultation are:  CBC    Component Value Date/Time   WBC 10.0 10/02/2021 1740   RBC 4.15 (L) 10/02/2021 1740   HGB 12.9 (L) 10/02/2021 1745   HCT 38.0 (L) 10/02/2021 1745   PLT 344 10/02/2021 1740   MCV 89.4 10/02/2021 1740   MCH 29.4 10/02/2021 1740   MCHC 32.9 10/02/2021 1740   RDW 12.8 10/02/2021 1740   LYMPHSABS 1.7 10/02/2021 1740   MONOABS 0.9 10/02/2021 1740   EOSABS 0.4 10/02/2021 1740   BASOSABS 0.0 10/02/2021 1740   CMP     Component Value Date/Time   NA 140 10/02/2021 1745   K 4.1 10/02/2021 1745   CL 108 10/02/2021 1745   CO2 25 09/23/2021 0201   GLUCOSE 101 (H) 10/02/2021 1745   BUN 29 (H) 10/02/2021 1745   CREATININE 1.40 (H) 10/02/2021 1745   CALCIUM 9.4 09/23/2021 0201   PROT 6.5 09/21/2021 1924   ALBUMIN 3.5 09/21/2021 1924   AST 22 09/21/2021 1924   ALT 24  09/21/2021 1924   ALKPHOS 87 09/21/2021 1924   BILITOT 0.4 09/21/2021 1924   GFRNONAA >60 09/23/2021 0201   GFRAA >60 08/14/2018 6789    Imaging MD reviewed the images obtained.  CT head 1. Progressive hypodensity right frontal parietal lobe compatible with evolving subacute infarct. Previously noted hemorrhagic transformation has resolved. No new area of hemorrhage. 2. Chronic microvascular ischemic change in the deep white matter  Assessment: 76 yo male with recent stay for right MCA infarct and  hemorrhagic conversion after TNK administration. He had mild left facial droop since that stroke. No other sequelae. Seizure activity to left arm en route and while here. He was given a total of 4mg  Ativan and 2gm Keppra load and seizure subsided. We will place him on 500mg  Keppra IV q12. His seizures are likely provoked due to his recent stroke/bleed.   Impression: -1st Seizure.  -Old stroke.   Recommendations/Plan:  -Medicine admit. Will need to stay 24 hours and be seizure free for discharge.  -rEEG-ordered.   -Keppra 500mg  IV q12 hours-ordered. Continue Keppra po on discharge.  -Seizure precautions.  -f/up out patient with neuro in 4 weeks-NP placed referral.  Per [facility] statutes, patients with seizures are not allowed to drive until they have been seizure-free for six months.   Use caution when using heavy equipment or power tools. Avoid working on ladders or at heights. Take showers instead of baths. Ensure the water temperature is not too high on the home water heater. Do not go swimming alone. Do not lock yourself in a room alone (i.e. bathroom). When caring for infants or small children, sit down when holding, feeding, or changing them to minimize risk of injury to the child in the event you have a seizure. Maintain good sleep hygiene. Avoid alcohol.    If patient has another seizure, call 911 and bring them back to the ED if: A.  The seizure lasts longer than 5  minutes.      B.  The patient doesn't wake shortly after the seizure or has new problems such as difficulty seeing, speaking or moving following the seizure C.  The patient was injured during the seizure D.  The patient has a temperature over 102 F (39C) E.  The patient vomited during the seizure and now is having trouble breathing    Pt seen by Clance Boll, NP/Neuro and later by MD. Note/plan to be edited by MD as needed.  Pager: [phone]   NEUROHOSPITALIST ADDENDUM Performed a face to face diagnostic evaluation.   I have reviewed the contents of history and physical exam as documented by PA/ARNP/Resident and agree with above documentation.  I have discussed and formulated the above plan as documented. Edits to the note have been made as needed.  Impression/Key exam findings/Plan: Presented with L arm rhythmic twitching that started around 1715, otherwise awake alert and talking. Likely focal seizure secondary to recent R MCA stroke with hemorrhagic transformation after tNKASE.  Workup with CTH with evolving subacute R MCA infarct with resolution of hemorrhagic transformation.  His seizure resolved after 4mg  of Ativan and loading with 2grams of Keppra. Has LUE weakness which is likely a combination of R MCA stroke and post ictal todd's.  Plan: - Keppra 500mg  BID - routine EEG. - Observe overnight. Can be discharged in AM if no further seizure recurrence and if LUE strength is back to his baseline after stroke.  Donnetta Simpers, MD [facility] [phone]   If 7pm to 7am, please call on call as listed on AMION.

## 2021-10-03 ENCOUNTER — Ambulatory Visit: Payer: Medicare Other | Admitting: Neurology

## 2021-10-03 ENCOUNTER — Telehealth: Payer: Self-pay | Admitting: Neurology

## 2021-10-03 ENCOUNTER — Observation Stay (HOSPITAL_COMMUNITY): Payer: Medicare Other

## 2021-10-03 DIAGNOSIS — E785 Hyperlipidemia, unspecified: Secondary | ICD-10-CM

## 2021-10-03 DIAGNOSIS — I6930 Unspecified sequelae of cerebral infarction: Secondary | ICD-10-CM

## 2021-10-03 DIAGNOSIS — N179 Acute kidney failure, unspecified: Secondary | ICD-10-CM | POA: Diagnosis not present

## 2021-10-03 DIAGNOSIS — R569 Unspecified convulsions: Secondary | ICD-10-CM | POA: Diagnosis not present

## 2021-10-03 DIAGNOSIS — N1831 Chronic kidney disease, stage 3a: Secondary | ICD-10-CM

## 2021-10-03 DIAGNOSIS — I10 Essential (primary) hypertension: Secondary | ICD-10-CM

## 2021-10-03 LAB — CBC
HCT: 36 % — ABNORMAL LOW (ref 39.0–52.0)
Hemoglobin: 11.8 g/dL — ABNORMAL LOW (ref 13.0–17.0)
MCH: 29.1 pg (ref 26.0–34.0)
MCHC: 32.8 g/dL (ref 30.0–36.0)
MCV: 88.9 fL (ref 80.0–100.0)
Platelets: 285 10*3/uL (ref 150–400)
RBC: 4.05 MIL/uL — ABNORMAL LOW (ref 4.22–5.81)
RDW: 12.9 % (ref 11.5–15.5)
WBC: 9.8 10*3/uL (ref 4.0–10.5)
nRBC: 0 % (ref 0.0–0.2)

## 2021-10-03 LAB — BASIC METABOLIC PANEL
Anion gap: 9 (ref 5–15)
BUN: 22 mg/dL (ref 8–23)
CO2: 23 mmol/L (ref 22–32)
Calcium: 9.3 mg/dL (ref 8.9–10.3)
Chloride: 108 mmol/L (ref 98–111)
Creatinine, Ser: 1.12 mg/dL (ref 0.61–1.24)
GFR, Estimated: >60 mL/min (ref >60)
Glucose, Bld: 127 mg/dL — ABNORMAL HIGH (ref 70–99)
Potassium: 3.9 mmol/L (ref 3.5–5.1)
Sodium: 140 mmol/L (ref 135–145)

## 2021-10-03 MED ORDER — LEVETIRACETAM 500 MG PO TABS: 500.0000 mg | ORAL_TABLET | Freq: Two times a day (BID) | ORAL | 2 refills | Status: DC

## 2021-10-03 NOTE — Discharge Summary (Signed)
Physician Discharge Summary  Richard Austin MBW:[id number] DOB: 07-17-1946 DOA: 10/02/2021  PCP: Richard Morning, DO  Admit date: 10/02/2021 Discharge date: 10/03/2021  Admitted From: Home  Discharge disposition: Home   Recommendations for Outpatient Follow-Up:   Follow up with your primary care provider in one week.  Check CBC, BMP, magnesium in the next visit Follow-up with [facility] as outpatient.  Office to schedule an appointment. Patient has been started on Keppra for possible seizures.  No driving until assessed by neurology/ PCP.   Discharge Diagnosis:   Principal Problem:   Seizure-like activity ([place]) Active Problems:   AKI (acute kidney injury) ([place])   Hypertension   Dyslipidemia   History of cerebrovascular accident (CVA) with residual deficit   Discharge Condition: Improved.  Diet recommendation: Low sodium, heart healthy.   Wound care: None.  Code status: Full.   History of Present Illness:   Richard Austin is a 76 y.o. male with medical history significant for hypertension, hyperlipidemia, bladder cancer, and recent stroke to the hospital with shaking of his left arm.  Patient was unable to give much history initially due to Ativan and sedation for seizures.  Of note patient was recently admitted to hospital from 1/11 to 09/23/2021 with left-sided weakness and facial droop.  Patient had a code stroke at that time and was noted to have intraparenchymal hematoma in the posterior right frontal lobe with vasogenic edema.  Patient was then noted to have residual left facial droop and left upper extremity on discharge.  He was doing well until this presentation when he was noted to have abnormal jerking movement of the left arm.  EMS was called in and patient was brought into the hospital.  CT head scan showed progressive hypodensity in the right frontal lobe compatible with evolving subacute infarct.  Previously noted hemorrhagic  transformation had resolved.  Neurology was consulted.  Patient received 4 mg of IV Ativan in the ED and was given IV Keppra 2 g load followed by 500 mg twice daily.  Hospital Course:   Following conditions were addressed during hospitalization as listed below,  Seizure-like activity  Thought to be focal seizure secondary to recent right CVA.  Patient received 4 mg of Ativan and 2 g of Keppra in the ED and has been started on Keppra 500 twice daily.  This will be continued on discharge.  Communicated with neurology prior to discharge.  Neurology will follow up with the patient as outpatient after discharge.     History of cerebrovascular accident (CVA) with residual deficit Patient was recently admitted for right MCA stroke with hemorrhagic transfusion status post tPA.  Has residual left-sided facial droop and left upper extremity weakness.  On 81 mg of aspirin and Zetia as outpatient.  This will be continued on discharge.   Acute kidney injury Mild.  Has resolved.  Resume lisinopril and HCTZ from home.   Essential hypertension. Lisinopril and chlorthalidone at home.   Dyslipidemia Continue Zetia.  Disposition.  At this time, patient is stable for disposition with outpatient PCP and neurology follow-up.  Spoke with the patient's spouse prior to disposition  Medical Consultants:   Neurology  Procedures:    EEG Subjective:   Today, patient was seen and examined at bedside.  Patient's wife at bedside.  No further report of seizures.  Wishes to eat.  Discharge Exam:   Vitals:   10/03/21 1200 10/03/21 1300  BP: 116/77 135/66  Pulse: 69 70  Resp: 12 (!)  22  Temp:  98 F (36.7 C)  SpO2: 97% 96%   Vitals:   10/03/21 1130 10/03/21 1145 10/03/21 1200 10/03/21 1300  BP: 128/71 (!) 151/80 116/77 135/66  Pulse: 70 80 69 70  Resp: 14 (!) 23 12 (!) 22  Temp:    98 F (36.7 C)  TempSrc:      SpO2: 98% 98% 97% 96%  Weight:      Height:        General: Alert awake, not in  obvious distress HENT: pupils equally reacting to light,  No scleral pallor or icterus noted. Oral mucosa is moist.  Chest:  Clear breath sounds.  Diminished breath sounds bilaterally. No crackles or wheezes.  CVS: S1 &S2 heard. No murmur.  Regular rate and rhythm. Abdomen: Soft, nontender, nondistended.  Bowel sounds are heard.   Extremities: No cyanosis, clubbing or edema.  Peripheral pulses are palpable. Psych: Alert, awake and oriented, normal mood CNS:  No cranial nerve deficits.  Left sided facial droop, left upper extremity weakness, mild dysarthria at baseline Skin: Warm and dry.  No rashes noted.  The results of significant diagnostics from this hospitalization (including imaging, microbiology, ancillary and laboratory) are listed below for reference.     Diagnostic Studies:   EEG adult  Result Date: 10/03/2021 Richard Havens, MD     10/03/2021 11:07 AM [name] MRN: [id number] Epilepsy Attending: Lora Austin Referring Physician/Provider: Gardiner Barefoot, NP Date: 10/03/2021 Duration: 24.35 mins Patient history: 76 year old male with right MCA stroke now with left arm rhythmic twitching.  EEG to evaluate for seizure Level of alertness: Awake, asleep AEDs during EEG study: Keppra Technical aspects: This EEG study was done with scalp electrodes positioned according to the 10-20 International system of electrode placement. Electrical activity was acquired at a sampling rate of 500Hz  and reviewed with a high frequency filter of 70Hz  and a low frequency filter of 1Hz . EEG data were recorded continuously and digitally stored. Description: The posterior dominant rhythm consists of 8-9 Hz activity of moderate voltage (25-35 uV) seen predominantly in posterior head regions, symmetric and reactive to eye opening and eye closing. Sleep was characterized by vertex waves, sleep spindles (12 to 14 Hz), maximal frontocentral region. EEG showed intermittent 3 to 5 Hz  theta-delta slowing in right hemisphere. Hyperventilation and photic stimulation were not performed.   ABNORMALITY - Intermittent slow, right hemisphere IMPRESSION: This study is suggestive of cortical dysfunction in right hemisphere, nonspecific etiology but likely secondary to underlying stroke.  No seizures or definite epileptiform discharges were seen throughout the recording. Richard Austin   CT HEAD CODE STROKE WO CONTRAST  Result Date: 10/02/2021 CLINICAL DATA:  Code stroke. A is cute neuro deficit. Stroke suspected. EXAM: CT HEAD WITHOUT CONTRAST TECHNIQUE: Contiguous axial images were obtained from the base of the skull through the vertex without intravenous contrast. RADIATION DOSE REDUCTION: This exam was performed according to the departmental dose-optimization program which includes automated exposure control, adjustment of the mA and/or kV according to patient size and/or use of iterative reconstruction technique. COMPARISON:  CT head 09/23/2021.  MRI head 09/22/2021 FINDINGS: Brain: Hypodensity in the right frontal parietal lobe appears larger compared with the prior CT. Prior hemorrhage is now isodense to the brain. Findings most compatible with subacute infarct with hemorrhagic transformation. No new hemorrhage since the prior study. No midline shift. Ventricle size normal. Chronic microvascular ischemic change in the deep white matter. Vascular: Negative for hyperdense vessel Skull:  Negative Sinuses/Orbits: Paranasal sinuses clear.  Left cataract extraction Other: Motion degraded study. IMPRESSION: 1. Progressive hypodensity right frontal parietal lobe compatible with evolving subacute infarct. Previously noted hemorrhagic transformation has resolved. No new area of hemorrhage. 2. Chronic microvascular ischemic change in the deep white matter. 3. Code stroke imaging results were communicated on 10/02/2021 at 5:55 pm to provider Lorrin Goodell via text page Electronically Signed   By: Franchot Gallo M.D.   On: 10/02/2021 17:56     Labs:   Basic Metabolic Panel: Recent Labs  Lab 10/02/21 1740 10/02/21 1745 10/03/21 0523  NA 138 140 140  K 4.0 4.1 3.9  CL 106 108 108  CO2 20*  --  23  GLUCOSE 102* 101* 127*  BUN 28* 29* 22  CREATININE 1.38* 1.40* 1.12  CALCIUM 9.8  --  9.3   GFR Estimated Creatinine Clearance: 55.1 mL/min (by C-G formula based on SCr of 1.12 mg/dL). Liver Function Tests: Recent Labs  Lab 10/02/21 1740  AST 18  ALT 18  ALKPHOS 96  BILITOT 0.4  PROT 7.1  ALBUMIN 3.8   No results for input(s): LIPASE, AMYLASE in the last 168 hours. No results for input(s): AMMONIA in the last 168 hours. Coagulation profile Recent Labs  Lab 10/02/21 1740  INR 0.9    CBC: Recent Labs  Lab 10/02/21 1740 10/02/21 1745 10/03/21 0523  WBC 10.0  --  9.8  NEUTROABS 6.9  --   --   HGB 12.2* 12.9* 11.8*  HCT 37.1* 38.0* 36.0*  MCV 89.4  --  88.9  PLT 344  --  285   Cardiac Enzymes: No results for input(s): CKTOTAL, CKMB, CKMBINDEX, TROPONINI in the last 168 hours. BNP: Invalid input(s): POCBNP CBG: Recent Labs  Lab 10/02/21 1738  GLUCAP 100*   D-Dimer No results for input(s): DDIMER in the last 72 hours. Hgb A1c No results for input(s): HGBA1C in the last 72 hours. Lipid Profile No results for input(s): CHOL, HDL, LDLCALC, TRIG, CHOLHDL, LDLDIRECT in the last 72 hours. Thyroid function studies No results for input(s): TSH, T4TOTAL, T3FREE, THYROIDAB in the last 72 hours.  Invalid input(s): FREET3 Anemia work up No results for input(s): VITAMINB12, FOLATE, FERRITIN, TIBC, IRON, RETICCTPCT in the last 72 hours. Microbiology Recent Results (from the past 240 hour(s))  Resp Panel by RT-PCR (Flu A&B, Covid) Nasopharyngeal Swab     Status: None   Collection Time: 10/02/21  6:55 PM   Specimen: Nasopharyngeal Swab; Nasopharyngeal(NP) swabs in vial transport medium  Result Value Ref Range Status   SARS Coronavirus 2 by RT PCR NEGATIVE NEGATIVE  Final    Comment: (NOTE) SARS-CoV-2 target nucleic acids are NOT DETECTED.  The SARS-CoV-2 RNA is generally detectable in upper respiratory specimens during the acute phase of infection. The lowest concentration of SARS-CoV-2 viral copies this assay can detect is 138 copies/mL. A negative result does not preclude SARS-Cov-2 infection and should not be used as the sole basis for treatment or other patient management decisions. A negative result may occur with  improper specimen collection/handling, submission of specimen other than nasopharyngeal swab, presence of viral mutation(s) within the areas targeted by this assay, and inadequate number of viral copies(<138 copies/mL). A negative result must be combined with clinical observations, patient history, and epidemiological information. The expected result is Negative.  Fact Sheet for Patients:  EntrepreneurPulse.com.au  Fact Sheet for Healthcare Providers:  IncredibleEmployment.be  This test is no t yet approved or cleared by the [place] and  has been authorized for detection and/or diagnosis of SARS-CoV-2 by FDA under an Emergency Use Authorization (EUA). This EUA will remain  in effect (meaning this test can be used) for the duration of the COVID-19 declaration under Section 564(b)(1) of the Act, 21 U.S.C.section 360bbb-3(b)(1), unless the authorization is terminated  or revoked sooner.       Influenza A by PCR NEGATIVE NEGATIVE Final   Influenza B by PCR NEGATIVE NEGATIVE Final    Comment: (NOTE) The Xpert Xpress SARS-CoV-2/FLU/RSV plus assay is intended as an aid in the diagnosis of influenza from Nasopharyngeal swab specimens and should not be used as a sole basis for treatment. Nasal washings and aspirates are unacceptable for Xpert Xpress SARS-CoV-2/FLU/RSV testing.  Fact Sheet for Patients: EntrepreneurPulse.com.au  Fact Sheet for Healthcare  Providers: IncredibleEmployment.be  This test is not yet approved or cleared by the [place] FDA and has been authorized for detection and/or diagnosis of SARS-CoV-2 by FDA under an Emergency Use Authorization (EUA). This EUA will remain in effect (meaning this test can be used) for the duration of the COVID-19 declaration under Section 564(b)(1) of the Act, 21 U.S.C. section 360bbb-3(b)(1), unless the authorization is terminated or revoked.  Performed at [facility], [place] [address], [place], [place] [address]      Discharge Instructions:   Discharge Instructions     Ambulatory referral to Neurology   Complete by: As directed    An appointment is requested in approximately: 4 weeks or as already scheduled.   He may already have f/up appt for stroke, but he returned to hospital on 10/02/21 with seizure.   Diet - low sodium heart healthy   Complete by: As directed    Discharge instructions   Complete by: As directed    Follow-up with neurology as scheduled by the clinic.  Take Keppra as prescribed.  Seek medical attention for worsening symptoms.  No driving until seen by neurology/primary care physician.   Increase activity slowly   Complete by: As directed       Allergies as of 10/03/2021       Reactions   Amlodipine Other (See Comments)   Muscle tightness, fatigue    Amlodipine Besylate    Other reaction(s): Unknown   Nebivolol Hcl Other (See Comments)   Muscle tightness and fatigue  Other reaction(s): Unknown   Statins Other (See Comments)   Muscle tightness and fatigue  Other reaction(s): Unknown   Latex Rash        Medication List     TAKE these medications    aspirin EC 81 MG tablet Take 81 mg by mouth daily. Swallow whole.   chlorthalidone 25 MG tablet Commonly known as: HYGROTON Take 25 mg by mouth every [place].   ezetimibe 10 MG tablet Commonly known as: ZETIA Take 1 tablet (10 mg total) by mouth daily.    fluticasone 50 MCG/ACT nasal spray Commonly known as: FLONASE Place 2 sprays into both nostrils 2 (two) times daily as needed for allergies or rhinitis.   HYDROcodone-acetaminophen 5-325 MG tablet Commonly known as: NORCO/VICODIN Take 1 tablet by mouth every 8 (eight) hours as needed for pain.   ibuprofen 800 MG tablet Commonly known as: ADVIL Take 800 mg by mouth 2 (two) times daily as needed for headache or moderate pain.   Krill Oil 500 MG Caps Take 500 mg by mouth daily.   levETIRAcetam 500 MG tablet Commonly known as: Keppra Take 1 tablet (500 mg total) by mouth 2 (  two) times daily.   lisinopril 40 MG tablet Commonly known as: ZESTRIL Take 40 mg by mouth every [place].   multivitamin tablet Take 1 tablet by mouth daily.   omeprazole 40 MG capsule Commonly known as: PRILOSEC Take 40 mg by mouth daily.   Refresh 1.4-0.6 % Soln Generic drug: Polyvinyl Alcohol-Povidone PF Place 1 drop into both eyes daily as needed (dry eyes).   tadalafil 20 MG tablet Commonly known as: CIALIS Take 20 mg by mouth once as needed (very high blood pressure with fluctuations).   tamsulosin 0.4 MG Caps capsule Commonly known as: FLOMAX Take 0.4 mg by mouth in the [place] and at bedtime.          Time coordinating discharge: 39 minutes  Signed:  Keayra Graham  [facility] 10/03/2021, 1:41 PM

## 2021-10-03 NOTE — ED Notes (Addendum)
This RN performed bedside neurological assessment on patient, however pt difficult to arouse, and only noted to open right eyelid with incomprehensible speech. Pt was previously administered Lorazepam IV earlier in the evening, and has been deeply sedated, however this RN feels that given it is past 6 hr mark, pt should be easier to arouse. RN consulted with admitting provider due to concerns, and provider at bedside to assess patient, noting that patient has previous deficits from recent stroke and patient also able to open eye and given comprehensible response with provider. This RN will continue to monitor.

## 2021-10-03 NOTE — ED Notes (Signed)
Pt pending d/c at this time, however is altered. Wife contacted and informed of impending d/c. Wife reports she is walking into a doctor's appointment and can pick him up after her appointment. She estimates to arrives around 1500.

## 2021-10-03 NOTE — ED Notes (Signed)
Wife and MD is at the bedside

## 2021-10-03 NOTE — Procedures (Signed)
[name]  MRN: [id number]  Epilepsy Attending: Lora Havens  Referring Physician/Provider: Gardiner Barefoot, NP Date: 10/03/2021 Duration: 24.35 mins  Patient history: 76 year old male with right MCA stroke now with left arm rhythmic twitching.  EEG to evaluate for seizure  Level of alertness: Awake, asleep  AEDs during EEG study: Keppra  Technical aspects: This EEG study was done with scalp electrodes positioned according to the 10-20 International system of electrode placement. Electrical activity was acquired at a sampling rate of 500Hz  and reviewed with a high frequency filter of 70Hz  and a low frequency filter of 1Hz . EEG data were recorded continuously and digitally stored.   Description: The posterior dominant rhythm consists of 8-9 Hz activity of moderate voltage (25-35 uV) seen predominantly in posterior head regions, symmetric and reactive to eye opening and eye closing. Sleep was characterized by vertex waves, sleep spindles (12 to 14 Hz), maximal frontocentral region. EEG showed intermittent 3 to 5 Hz theta-delta slowing in right hemisphere. Hyperventilation and photic stimulation were not performed.     ABNORMALITY - Intermittent slow, right hemisphere  IMPRESSION: This study is suggestive of cortical dysfunction in right hemisphere, nonspecific etiology but likely secondary to underlying stroke.  No seizures or definite epileptiform discharges were seen throughout the recording.  Phung Kotas Barbra Sarks

## 2021-10-03 NOTE — Progress Notes (Addendum)
Neurology Progress Note   Subjective: Richard Austin is a 76 year old male with PMHx of HTN, diverticulitis, and a recent stroke c/b bleed s/p TNK who was BIBEMS as a code stroke with focal LUE jerking. He reports LUE weakness but no other complaints today. He has had no further seizure-like activity in the ED. The plan for monitoring was conveyed to him, and he voiced understanding with no additional questions.  Exam: Vitals:   10/03/21 0200 10/03/21 0405  BP: (!) 144/92 (!) 156/64  Pulse: 73 60  Resp:  14  Temp:    SpO2: 99% 97%   Physical Exam  Constitutional: Appears well-developed and well-nourished.  Psych: Affect appropriate to situation Eyes: No scleral injection HENT: No OP obstrucion MSK: no joint deformities.  Cardiovascular: Normal rate and regular rhythm.  Respiratory: Effort normal, non-labored breathing GI: Soft.  No distension. There is no tenderness.  Skin: WDI  Neuro: Mental Status: Patient is awake, alert, oriented to person, place, month, year. Patient is able to give a clear and coherent history. Mildly dysarthric speech; no signs of aphasia Cranial Nerves: II: Visual Fields are full. Pupils are equal, round, and reactive to light.  III,IV, VI: EOMI without ptosis or diploplia.  V: Facial sensation is symmetric to light touch VII: Facial movement is asymmetric resting and smiling, slight L sided facial droop, unchanged since 1/13 VIII: Hearing is intact to voice XI: Shoulder shrug is symmetric. Motor: Tone is normal. Bulk is normal. 5/5 strength was present in RUE and BLE; 4/5 flexor strength of LUE, but 2/5 extensor strength.  Sensory: Sensation is symmetric to light touch in the arms and legs, but not the hands; R>L hand sensation. No extinction to DSS present.  Deep Tendon Reflexes: 2+ and symmetric in the biceps and patellae.  Cerebellar: FNF intact on the R side (LUE with weakness and difficulty lifting) and HKS intact bilaterally Gait:  Deferred  Pertinent Labs: Lipid panel      Component Ref Range & Units 11 d ago  Cholesterol 0 - 200 mg/dL 209 High    Triglycerides <150 mg/dL 183 High    HDL >40 mg/dL 35 Low    Total CHOL/HDL Ratio RATIO 6.0   VLDL 0 - 40 mg/dL 37   LDL Cholesterol 0 - 99 mg/dL 137 High       Hemoglobin A1c        Component Ref Range & Units 11 d ago (09/22/21) 3 yr ago (08/14/18) 3 yr ago (07/07/18)  Hgb A1c MFr Bld 4.8 - 5.6 % 6.4 High   6.2 High  CM  6.4 High  CM   Comment: (NOTE)       Imaging Reviewed: CT Head: Subacute infarct of R frontal parietal lobe (previous stroke). No new hemorrhagic changes.   Assessment: Patient is a 76 year old male who was recently admitted for a stroke c/b bleed after TNK administration who presented for evaluation of seizure/stroke after experiencing uncontrolled jerking of his LUE. Patient without signs of seizure on assessment today and no new areas of hemorrhagic nor ischemic stroke, but with weakness of LUE (present on discharge 1/13 after initial stroke). We will need to further assess to observe epileptiform discharges.    Recommendations: 1)Routine EEG to monitor for any epileptiform discharges  2) Antiepileptic therapy with Keppra 500 mg BID 3)Continue Aspirin 81 mg daily  SEIZURE PRECAUTIONS Per [facility] statutes, patients with seizures are not allowed to drive until they have been seizure-free for six  months.   Use caution when using heavy equipment or power tools. Avoid working on ladders or at heights. Take showers instead of baths. Ensure the water temperature is not too high on the home water heater. Do not go swimming alone. Do not lock yourself in a room alone (i.e. bathroom). When caring for infants or small children, sit down when holding, feeding, or changing them to minimize risk of injury to the child in the event you have a seizure. Maintain good sleep hygiene. Avoid alcohol.    If patient has another seizure, call 911 and  bring them back to the ED if: A.  The seizure lasts longer than 5 minutes.      B.  The patient doesn't wake shortly after the seizure or has new problems such as difficulty seeing, speaking or moving following the seizure C.  The patient was injured during the seizure D.  The patient has a temperature over 102 F (39C) E.  The patient vomited during the seizure and now is having trouble breathing   Pt seen by Neuro Psych Resident and later by attending MD. Note/plan to be edited by MD as needed.    Rosezetta Schlatter, MD PGY-1 10/03/2021  9:23 AM      Attending addendum   S:// Patient seen and examined @ER  - bed H018.  Agree with the history and physical documented by resident physician Dr. Clotilde Dieter. On my arrival, patient was sleeping in bed. Wife at bedside. Wife is somewhat upset about patient being moved to a hallway bed. No new seizure-like activity while in ER.   On examination:// Vitals appear stable Well-developed well-nourished man in no acute distress Normocephalic atraumatic Lungs clear Cardiovascular examination with regular rate rhythm Abdomen nondistended nontender Extremities with diminished range of motion in the left shoulder due to pain and swelling as well as left hand post carpal tunnel surgery. Neurological exam Awake alert oriented x3 Mildly dysarthric speech No evidence of aphasia Cranial nerve examination with mild left lower face weakness-otherwise unremarkable. Motor examination with limited range of motion of the left shoulder with barely 3/5 strength.  Otherwise intact. Sensation intact light touch No dysmetria  Imaging reviewed personally:CT head on arrival with progressive hypodensity of the right frontoparietal lobe compatible with evolving subacute infarct.  Previously noted hemorrhagic transformation has resolved.  No new hemorrhage.   Assessment and recommendations:// 76 year old with a recent right MCA infarct with hemorrhagic transformation  after TNKase ministration with residual mild left facial weakness and left shoulder weakness due to fall, brought in for focal seizure activity involving the left arm.  Started on Keppra. 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 Will need antiepileptics-continue Keppra 500 twice daily. EEG with right sided dysfunction - no seizures. Follow-up outpatient neurology in 4 weeks Seizure precautions as detailed Plan relayed to Dr. Louanne Belton.   -- Amie Portland, MD Neurologist [facility] Pager: [phone]

## 2021-10-03 NOTE — Progress Notes (Signed)
EEG complete - results pending 

## 2021-10-03 NOTE — ED Notes (Signed)
Pt AVS printed and placed at the bedside. Awaiting wife to arrive to go over paperwork.

## 2021-10-03 NOTE — Telephone Encounter (Signed)
Pt's wife Abrham Maslowski cancelled appt due to pt in ED due a stroke;waiting for a room.

## 2021-10-03 NOTE — ED Notes (Addendum)
RN at bedside  to re-apply monitor leads. Upon arrival to room, floor noted to be saturated with fluid. Pt also noted to be completely undressed from the waist down. D/t high sedation from previous medication administration and inability to thoroughly arouse patient, pt moved to hallway bed for continuous visual monitoring.

## 2021-10-03 NOTE — ED Notes (Signed)
Patient brought out to wife via wheelchair. Discharge instructions provided to wife at the vehicle.

## 2021-10-04 ENCOUNTER — Ambulatory Visit: Payer: Medicare Other

## 2021-10-04 ENCOUNTER — Other Ambulatory Visit: Payer: Self-pay

## 2021-10-04 DIAGNOSIS — R471 Dysarthria and anarthria: Secondary | ICD-10-CM

## 2021-10-04 NOTE — Therapy (Signed)
[facility] [address] [place], [place], [address] Phone: [phone]   Fax:  [phone]  Speech Language Pathology Treatment  Patient Details  Name: Richard Austin MRN: [id number] Date of Birth: 1945-10-23 No data recorded  Encounter Date: 10/04/2021   End of Session - 10/04/21 1250     Visit Number 2    Number of Visits 5    Date for SLP Re-Evaluation 11/04/21    SLP Start Time 1147    SLP Stop Time  1228    SLP Time Calculation (min) 41 min    Activity Tolerance Patient tolerated treatment well             Past Medical History:  Diagnosis Date   Arthritis    Bladder tumor    Diverticulitis    Diverticulosis    Hypertension    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 2019   Pre-diabetes     Past Surgical History:  Procedure Laterality Date   ABDOMINAL SURGERY     ABLATION FOR SVT  2000 OR 2001   APPENDECTOMY  1970s   COLOSTOMY     LATER REVERSED   IRRIGATION AND DEBRIDEMENT ABSCESS N/A 07/18/2021   Procedure: IRRIGATION AND DEBRIDEMENT ABSCESS;  Surgeon: Irine Seal, MD;  Location: [facility];  Service: Urology;  Laterality: N/A;   LUMBAR LAMINECTOMY  1970s   NESBIT PROCEDURE N/A    TRANSURETHRAL RESECTION OF BLADDER TUMOR WITH MITOMYCIN-C N/A 08/21/2018   Procedure: TRANSURETHRAL RESECTION OF BLADDER TUMOR WITH GEMCITABINE;  Surgeon: Lucas Mallow, MD;  Location: [facility];  Service: Urology;  Laterality: N/A;    There were no vitals filed for this visit.   Subjective Assessment - 10/04/21 1155     Subjective Pt with diagnosed seizure and went to ED. On Keppra. No change in speech intelligibility.                   ADULT SLP TREATMENT - 10/04/21 1158       General Information   Behavior/Cognition Alert;Cooperative;Pleasant mood      Pain Assessment   Pain Assessment No/denies pain      Cognitive-Linquistic Treatment   Treatment focused on Dysarthria    Skilled Treatment Pt's  speech remains 100% intelligible with some mild alterations in articulation. Upon re-assessment of oral musculature, no measurable change in oral weakness/coordination from eval. Pt reports all exercises on HEP except for pucker and retract due to persistent lt labial droop and pucker with the whistle. Pt remarked that his whistle does sound stronger than initially at evaluation.SLP had pt review his HEP for dysarthria - pt req'd occasional min A for exaggerated initial sounds. SLP engaged pt in conversation for 10 minutes and he produced conversation with exaggerated speech with one cue from SLP to slow speech rate adn overarticulate.      Assessment / Recommendations / Plan   Plan Continue with current plan of care                  SLP Long Term Goals - 10/04/21 1251       SLP LONG TERM GOAL #1   Title pt will perform dysphagia HEP with rare min A x2 sessions    Time 4    Period Weeks    Status On-going    Target Date 11/04/21      SLP LONG TERM GOAL #2   Title pt will participate in 15 minutes mod  complex/complex conversation with speech precision/clarity x2 sessions    Time 4    Period Weeks    Status On-going    Target Date 11/04/21              Plan - 10/04/21 1251     Clinical Impression Statement [name]") presents today with difficulties in speech clarity. Speech intelligibility is 100%. He worked today with SLP on home exercises and practice on speech compensations. He reports he uses compensations for memory at times, successfully. Today pt exhibited WNL cognition during evaluation, judged subjectively from this SLP. He would cont to benefit from skilled ST targeting improving speech clarity.    Speech Therapy Frequency 1x /week    Duration 4 weeks    Treatment/Interventions SLP instruction and feedback;Compensatory strategies;Patient/family education;Oral motor exercises    Potential to Achieve Goals Good             Patient will benefit  from skilled therapeutic intervention in order to improve the following deficits and impairments:   Dysarthria and anarthria    Problem List Patient Active Problem List   Diagnosis Date Noted   Seizure-like activity ([place]) 10/02/2021   History of cerebrovascular accident (CVA) with residual deficit 10/02/2021   Allergic rhinitis 09/26/2021   Bladder cancer ([place]) 09/26/2021   Chronic renal failure, stage 3a ([place]) 09/26/2021   Dyslipidemia 09/26/2021   Erectile dysfunction 09/26/2021   Gastroesophageal reflux disease 09/26/2021   Osteoarthritis 09/26/2021   Type 2 diabetes mellitus without complications ([place]) 94/03/6807   Right wrist pain 09/24/2021   Pain of left hand 09/24/2021   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 09/21/2021   Injury of left shoulder 09/19/2021   Abscess of groin, right 07/19/2021   Subcutaneous abscess 07/18/2021   Numbness of hand 07/13/2021   Spinal stenosis in cervical region 05/22/2021   Neck pain 03/07/2021   Benign prostatic hyperplasia without lower urinary tract symptoms 03/02/2020   Diverticulosis of large intestine 03/02/2020   SBO (small bowel obstruction) ([place]) 07/06/2018   AKI (acute kidney injury) ([place]) 07/06/2018   Hyperglycemia 07/06/2018   Hypertension 07/06/2018   Leukocytosis 07/06/2018   Bladder mass 07/06/2018   Pericardial effusion 07/06/2018   Hypercalcemia 07/06/2018    Richard Austin, [place] 10/04/2021, 12:52 PM  [facility] [address], [place], [place], [address] Phone: [phone]   Fax:  [phone]   Name: Richard Austin MRN: [id number] Date of Birth: April 09, 1946

## 2021-10-06 DIAGNOSIS — Z4789 Encounter for other orthopedic aftercare: Secondary | ICD-10-CM | POA: Insufficient documentation

## 2021-10-10 ENCOUNTER — Other Ambulatory Visit: Payer: Self-pay

## 2021-10-10 ENCOUNTER — Encounter: Payer: Self-pay | Admitting: *Deleted

## 2021-10-10 ENCOUNTER — Other Ambulatory Visit: Payer: Self-pay | Admitting: *Deleted

## 2021-10-10 NOTE — Patient Outreach (Signed)
Richard Austin) Care Management Telephonic RN Care Manager Note   10/10/2021 Name:  Richard Austin MRN:  [id number] DOB:  04-11-1946  Summary: Follow up outreach to patient for EMMI red alert related scheduled appointment from 09/26/21 7 10/07/21 referrals  Richard Austin confirms he received calls today for his appointments with primary care and neurology [facility] will send him to a local community provider to fulfill his Austin follow up next week and he is to go on 10/31/21 1600 to see Dr Leonie Man, neurology Denies other needs at this time  Recommendations/Changes made from today's visit: Assessed for concerns with scheduling appointments per new Friday 10/07/21 10 am EMMI stroke red alert referral received today 09/30/21    Subjective: Richard Austin is an 76 y.o. year old male who is a primary patient of [name], DO. The care management team was consulted for assistance with care management and/or care coordination needs.    Telephonic RN Care Manager completed Telephone Visit today.   Objective:  Medications Reviewed Today     Reviewed by Lannette Donath, CPhT (Pharmacy Technician) on 10/03/21 at [phone]  Med List Status: Complete   Medication Order Taking? Sig Documenting Provider Last Dose Status Informant  aspirin EC 81 MG tablet [id number] Yes Take 81 mg by mouth daily. Swallow whole. [provider] 10/02/2021 Active Spouse/Significant Other  chlorthalidone (HYGROTON) 25 MG tablet [id number] Yes Take 25 mg by mouth every morning. [provider] 10/02/2021 Active Spouse/Significant Other  ezetimibe (ZETIA) 10 MG tablet [id number] Yes Take 1 tablet (10 mg total) by mouth daily. Janine Ores, NP 10/02/2021 Active Spouse/Significant Other  fluticasone (FLONASE) 50 MCG/ACT nasal spray [id number] Yes Place 2 sprays into both nostrils 2 (two) times daily as needed for allergies or rhinitis.  [provider] unk Active  Spouse/Significant Other  HYDROcodone-acetaminophen (NORCO/VICODIN) 5-325 MG tablet [id number] Yes Take 1 tablet by mouth every 8 (eight) hours as needed for pain. [provider] unk Active Spouse/Significant Other  ibuprofen (ADVIL) 800 MG tablet [id number] Yes Take 800 mg by mouth 2 (two) times daily as needed for headache or moderate pain. [provider] Past Month Active Spouse/Significant Other  Krill Oil 500 MG CAPS [id number] Yes Take 500 mg by mouth daily. [provider] 10/02/2021 Active Spouse/Significant Other  lisinopril (PRINIVIL,ZESTRIL) 40 MG tablet [id number] Yes Take 40 mg by mouth every morning. [provider] 10/02/2021 Active Spouse/Significant Other  Multiple Vitamin (MULTIVITAMIN) tablet [id number] Yes Take 1 tablet by mouth daily. [provider] 10/02/2021 Active Spouse/Significant Other  omeprazole (PRILOSEC) 40 MG capsule [id number] Yes Take 40 mg by mouth daily. [provider] 10/02/2021 Active Spouse/Significant Other  Polyvinyl Alcohol-Povidone PF (REFRESH) 1.4-0.6 % SOLN [id number] Yes Place 1 drop into both eyes daily as needed (dry eyes). [provider] unk Active Spouse/Significant Other  tadalafil (CIALIS) 20 MG tablet [id number] Yes Take 20 mg by mouth once as needed (very high blood pressure with fluctuations). [provider] unk Active Spouse/Significant Other  tamsulosin (FLOMAX) 0.4 MG CAPS capsule [id number] Yes Take 0.4 mg by mouth in the morning and at bedtime. [provider] 10/02/2021 Active Spouse/Significant Other             SDOH:  (Social Determinants of Health) assessments and interventions performed:  SDOH Interventions    Flowsheet Row Most Recent Value  SDOH Interventions   Food Insecurity Interventions Intervention Not Indicated  Financial Strain Interventions Intervention Not Indicated  Housing Interventions Intervention Not  Indicated  Transportation Interventions  Intervention Not Indicated       Care Plan  Review of patient past medical history, allergies, medications, health status, including review of consultants reports, laboratory and other test data, was performed as part of comprehensive evaluation for care management services.   Care Plan : RN Care Manager Plan of Care  Updates made by Barbaraann Faster, RN since 10/10/2021 12:00 AM     Problem: Complex Care Coordination Needs and disease management in patient with stroke, HTN, DM   Priority: High  Onset Date: 09/26/2021     Long-Range Goal: Establish Plan of Care for Management Complex SDOH Barriers, disease management and Care Coordination Needs in patient with stroke, HTN, DM   Start Date: 09/26/2021  This Visit's Progress: On track  Recent Progress: On track  Priority: High  Note:   Current Barriers:  Knowledge Deficits related to plan of care for management of HTN, DMII, and stroke  Care Coordination needs related to Limited education about stroke, HTN, DMII HTN* EMMI stroke red alerts x 2 for appointments (09/26/21 & 10/10/21) resolved 10/10/21  RN CM Clinical Goal(s):  Patient will verbalize understanding of plan for management of HTN, DMII, and stroke as evidenced by voiced improved BP, HgA1c,decrease risk for re admission in next 30 business days  through collaboration with Consulting civil engineer, provider, and care team.   Interventions: Further outreach for care coordination, disease management/education Inter-disciplinary care team collaboration (see longitudinal plan of care) Evaluation of current treatment plan related to  self management and patient's adherence to plan as established by provider 10/10/21 Assessed for concerns with scheduling appointments per new Friday 10/07/21 10 am EMMI stroke red alert referral received today 09/30/21 Denies other needs at this time   Diabetes Interventions:  (Status:  Condition stable.  Not addressed this visit.) Short Term Goal Assessed  patient's understanding of A1c goal: <7% Discussed plans with patient for ongoing care management follow up and provided patient with direct contact information for care management team Lab Results  Component Value Date   HGBA1C 6.4 (H) 09/22/2021   Hypertension Interventions:  (Status:  Condition stable.  Not addressed this visit.) Long Term Goal Last practice recorded BP readings:  BP Readings from Last 3 Encounters:  10/03/21 118/76  09/23/21 126/65  07/19/21 115/75  Most recent eGFR/CrCl: No results found for: EGFR  No components found for: CRCL  Evaluation of current treatment plan related to hypertension self management and patient's adherence to plan as established by provider  Stroke:  (Status:Goal on track:  Yes.) Long Term Goal Reviewed Importance of taking all medications as prescribed Reviewed Importance of attending all scheduled provider appointments Assessed social determinant of health barriers  Patient Goals/Self-Care Activities: Take all medications as prescribed Attend all scheduled provider appointments Perform all self care activities independently  Perform IADL's (shopping, preparing meals, housekeeping, managing finances) independently Call provider office for new concerns or questions   Follow Up Plan:  The patient has been provided with contact information for the care management team and has been advised to call with any health related questions or concerns.  The care management team will reach out to the patient again over the next 30 + business  days.       Plan: The patient has been provided with contact information for the care management team and has been advised to call with any health related questions or concerns.  The care management team will reach out to the patient again  over the next 30+ business days.  Abbeygail Igoe L. Lavina Hamman, RN, BSN, [place] Coordinator Office number [phone] [facility] number  [phone] Fax number [phone]

## 2021-10-11 ENCOUNTER — Ambulatory Visit: Payer: Medicare Other

## 2021-10-11 DIAGNOSIS — R471 Dysarthria and anarthria: Secondary | ICD-10-CM | POA: Diagnosis not present

## 2021-10-11 NOTE — Patient Instructions (Signed)
Do exercises 3-4 times a week through February and then you can discharge the exercises.

## 2021-10-11 NOTE — Therapy (Signed)
[facility] [address], [place], [place], [address] Phone: [phone]   Fax:  [phone]  Speech Language Pathology Treatment/Discharge summary  Patient Details  Name: Richard Austin MRN: [id number] Date of Birth: [birth year] Referring MD, Rosalin Hawking, MD  Encounter Date: 10/11/2021   End of Session - 10/11/21 1112     Visit Number 3    Number of Visits 5    Date for SLP Re-Evaluation 11/04/21    SLP Start Time 68    SLP Stop Time  1052    SLP Time Calculation (min) 33 min    Activity Tolerance Patient tolerated treatment well             Past Medical History:  Diagnosis Date   Arthritis    Bladder tumor    Diverticulitis    Diverticulosis    Hypertension    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 2019   Pre-diabetes     Past Surgical History:  Procedure Laterality Date   ABDOMINAL SURGERY     ABLATION FOR SVT  2000 OR 2001   APPENDECTOMY  1970s   COLOSTOMY     LATER REVERSED   IRRIGATION AND DEBRIDEMENT ABSCESS N/A 07/18/2021   Procedure: IRRIGATION AND DEBRIDEMENT ABSCESS;  Surgeon: Irine Seal, MD;  Location: [facility];  Service: Urology;  Laterality: N/A;   LUMBAR LAMINECTOMY  1970s   NESBIT PROCEDURE N/A    TRANSURETHRAL RESECTION OF BLADDER TUMOR WITH MITOMYCIN-C N/A 08/21/2018   Procedure: TRANSURETHRAL RESECTION OF BLADDER TUMOR WITH GEMCITABINE;  Surgeon: Lucas Mallow, MD;  Location: [facility];  Service: Urology;  Laterality: N/A;    There were no vitals filed for this visit.  SPEECH THERAPY DISCHARGE SUMMARY  Visits from Start of Care: 3  Current functional level related to goals / functional outcomes: See goals below. Partially met, however SLP believes that if pt stayed in [place] one more session goals would be fully met.   Remaining deficits: None   Education / Equipment: Compensations for dysarthria, HEP for dysarthria, OT recommendation.   Patient agrees to discharge. Patient  goals were partially met. Patient is being discharged due to the patient's request..      Subjective Assessment - 10/11/21 1051     Subjective "I think I'm ready to be done. When I talk, I can feel the left side of my lips move now."    Currently in Pain? No/denies                   ADULT SLP TREATMENT - 10/11/21 1055       General Information   Behavior/Cognition Alert;Cooperative;Pleasant mood      Cognitive-Linquistic Treatment   Treatment focused on Dysarthria    Skilled Treatment Pt reports more lt labial margin movement when speaking, and SLP witnessed this today with pt verbalization wihtout mask, out doors. Pt lip retraction and protrusion were WNL. He completed HEP with indpendence and spoke with SLP for 15 minutes in mod complex/complex conversation with 100% intelligibility and more clarity of speech than previous session. SLP agrees pt is ready for d/c today. [facility]      Assessment / Recommendations / Plan   Plan Discharge SLP treatment due to (comment)   pt request     Progression Toward Goals   Progression toward goals --   see goals  SLP Long Term Goals - 10/11/21 1041       SLP LONG TERM GOAL #1   Title pt will perform dysphagia HEP with rare min A x2 sessions    Baseline 10-11-21    Time --    Period --    Status Partially Met      SLP LONG TERM GOAL #2   Title pt will participate in 15 minutes mod complex/complex conversation with speech precision/clarity x2 sessions    Baseline 10-11-21    Time --    Period Weeks    Status Partially Met              Plan - 10/11/21 1112     Clinical Impression Statement [name]") presents today with difficulties in speech clarity. Speech intelligibility is 100%. Home exercises and speech compensations goals were partially met but pt feels he can complete whatever needs to be done at home. After today's session SLP agrees.  Skilled ST targeting improving speech clarity will be d/c'd today.    Treatment/Interventions SLP instruction and feedback;Compensatory strategies;Patient/family education;Oral motor exercises    Potential to Achieve Goals Good             Patient will benefit from skilled therapeutic intervention in order to improve the following deficits and impairments:   Dysarthria and anarthria    Problem List Patient Active Problem List   Diagnosis Date Noted   Seizure-like activity ([place]) 10/02/2021   History of cerebrovascular accident (CVA) with residual deficit 10/02/2021   Allergic rhinitis 09/26/2021   Bladder cancer ([place]) 09/26/2021   Chronic renal failure, stage 3a ([place]) 09/26/2021   Dyslipidemia 09/26/2021   Erectile dysfunction 09/26/2021   Gastroesophageal reflux disease 09/26/2021   Osteoarthritis 09/26/2021   Type 2 diabetes mellitus without complications ([place]) 62/13/0865   Right wrist pain 09/24/2021   Pain of left hand 09/24/2021   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 09/21/2021   Injury of left shoulder 09/19/2021   Abscess of groin, right 07/19/2021   Subcutaneous abscess 07/18/2021   Numbness of hand 07/13/2021   Spinal stenosis in cervical region 05/22/2021   Neck pain 03/07/2021   Benign prostatic hyperplasia without lower urinary tract symptoms 03/02/2020   Diverticulosis of large intestine 03/02/2020   SBO (small bowel obstruction) ([place]) 07/06/2018   AKI (acute kidney injury) ([place]) 07/06/2018   Hyperglycemia 07/06/2018   Hypertension 07/06/2018   Leukocytosis 07/06/2018   Bladder mass 07/06/2018   Pericardial effusion 07/06/2018   Hypercalcemia 07/06/2018    Richard Austin, [place] 10/11/2021, 11:14 AM  [facility] [place] [address] [place], [place], [address] Phone: [phone]   Fax:  [phone]   Name: Richard Austin MRN: [id number] Date of  Birth: 08-Oct-1945

## 2021-10-13 DIAGNOSIS — C674 Malignant neoplasm of posterior wall of bladder: Secondary | ICD-10-CM | POA: Diagnosis not present

## 2021-10-18 DIAGNOSIS — Z5111 Encounter for antineoplastic chemotherapy: Secondary | ICD-10-CM | POA: Diagnosis not present

## 2021-10-18 DIAGNOSIS — C674 Malignant neoplasm of posterior wall of bladder: Secondary | ICD-10-CM | POA: Diagnosis not present

## 2021-10-25 DIAGNOSIS — C674 Malignant neoplasm of posterior wall of bladder: Secondary | ICD-10-CM | POA: Diagnosis not present

## 2021-10-25 DIAGNOSIS — Z5111 Encounter for antineoplastic chemotherapy: Secondary | ICD-10-CM | POA: Diagnosis not present

## 2021-10-31 ENCOUNTER — Other Ambulatory Visit: Payer: Self-pay | Admitting: *Deleted

## 2021-10-31 ENCOUNTER — Ambulatory Visit: Payer: Medicare Other | Admitting: Neurology

## 2021-10-31 DIAGNOSIS — M25512 Pain in left shoulder: Secondary | ICD-10-CM | POA: Diagnosis not present

## 2021-10-31 NOTE — Patient Outreach (Signed)
[facility]) Care Management  10/31/2021  Johnathan Heskett 06-08-1946 [id number]   [facility] Unsuccessful outreach   Outreach attempt to the listed at the preferred outreach number in EPIC  No answer. [facility] RN CM left HIPAA [facility] Portability and Accountability Act) compliant voicemail message along with CMs contact info.   Plan: [facility] RN CM scheduled this patient for another call attempt within 30 +business days  Unsuccessful outreach on 10/31/21   Blanche Scovell L. Lavina Hamman, RN, BSN, [place] Coordinator Office number [phone]

## 2021-11-01 ENCOUNTER — Other Ambulatory Visit: Payer: Self-pay | Admitting: *Deleted

## 2021-11-01 DIAGNOSIS — C674 Malignant neoplasm of posterior wall of bladder: Secondary | ICD-10-CM | POA: Diagnosis not present

## 2021-11-01 DIAGNOSIS — R002 Palpitations: Secondary | ICD-10-CM | POA: Insufficient documentation

## 2021-11-01 DIAGNOSIS — E785 Hyperlipidemia, unspecified: Secondary | ICD-10-CM | POA: Insufficient documentation

## 2021-11-01 DIAGNOSIS — M609 Myositis, unspecified: Secondary | ICD-10-CM | POA: Insufficient documentation

## 2021-11-01 DIAGNOSIS — Z5111 Encounter for antineoplastic chemotherapy: Secondary | ICD-10-CM | POA: Diagnosis not present

## 2021-11-01 DIAGNOSIS — M25512 Pain in left shoulder: Secondary | ICD-10-CM | POA: Insufficient documentation

## 2021-11-01 DIAGNOSIS — Z87898 Personal history of other specified conditions: Secondary | ICD-10-CM | POA: Insufficient documentation

## 2021-11-01 NOTE — Patient Outreach (Signed)
[facility]) Care Management Telephonic RN Care Manager Note   11/01/2021 Name:  Devonn Giampietro MRN:  [id number] DOB:  May 15, 1946  Summary: Follow up outreach Report he is doing well and noticing improvements  Neurology appointment has been rescheduled by the neurology office to 11/16/21 0730   Still some dribbling but no longer noticing drooping,   no feeling of left hand but had recent carpal tunnel surgery before his stroke so reports not able to tell if this is related to the stroke or the carpal tunnel He reports he can open and close there left hand but it is hard to hold a fork  He reports noticing he gets tired mainly during the day  He notes feeling tired after taking Heparin or Zetia during day  He confirms he does have a history of having Zetia causing him to feel  tired  Upcoming appointments [facility]  Recommendations/Changes made from today's visit: Assessed for worsening symptoms Assessed for neurology visit  Suggest iron enrich food   Subjective: Alhaji Mcneal is an 76 y.o. year old male who is a primary patient of [name], DO. The care management team was consulted for assistance with care management and/or care coordination needs.    Telephonic RN Care Manager completed Telephone Visit today.   Objective:  Medications Reviewed Today     Reviewed by Sharen Counter, CCC-SLP (Speech and Language Pathologist) on 10/11/21 at 1024  Med List Status: <None>   Medication Order Taking? Sig Documenting Provider Last Dose Status Informant  aspirin EC 81 MG tablet [id number] No Take 81 mg by mouth daily. Swallow whole. [provider] 10/02/2021 Active Spouse/Significant Other  chlorthalidone (HYGROTON) 25 MG tablet [id number] No Take 25 mg by mouth every morning. [provider] 10/02/2021 Active Spouse/Significant Other  ezetimibe (ZETIA) 10 MG tablet  [id number] No Take 1 tablet (10 mg total) by mouth daily. Janine Ores, NP 10/02/2021 Active Spouse/Significant Other  fluticasone (FLONASE) 50 MCG/ACT nasal spray [id number] No Place 2 sprays into both nostrils 2 (two) times daily as needed for allergies or rhinitis.  [provider] unk Active Spouse/Significant Other  HYDROcodone-acetaminophen (NORCO/VICODIN) 5-325 MG tablet [id number] No Take 1 tablet by mouth every 8 (eight) hours as needed for pain. [provider] unk Active Spouse/Significant Other  ibuprofen (ADVIL) 800 MG tablet [id number] No Take 800 mg by mouth 2 (two) times daily as needed for headache or moderate pain. [provider] Past Month Active Spouse/Significant Other  Krill Oil 500 MG CAPS [id number] No Take 500 mg by mouth daily. [provider] 10/02/2021 Active Spouse/Significant Other  levETIRAcetam (KEPPRA) 500 MG tablet [id number]  Take 1 tablet (500 mg total) by mouth 2 (two) times daily. Pokhrel, L[place], MD  Active   lisinopril (PRINIVIL,ZESTRIL) 40 MG tablet [id number] No Take 40 mg by mouth every morning. [provider] 10/02/2021 Active Spouse/Significant Other  Multiple Vitamin (MULTIVITAMIN) tablet [id number] No Take 1 tablet by mouth daily. [provider] 10/02/2021 Active Spouse/Significant Other  omeprazole (PRILOSEC) 40 MG capsule [id number] No Take 40 mg by mouth daily. [provider] 10/02/2021 Active Spouse/Significant Other  Polyvinyl Alcohol-Povidone PF (REFRESH) 1.4-0.6 % SOLN [id number] No Place 1 drop into both eyes daily as needed (dry eyes). [provider] unk Active Spouse/Significant Other  tadalafil (CIALIS) 20 MG tablet [id number] No Take 20 mg by mouth once as needed (very high blood pressure with fluctuations). [provider] unk Active  Spouse/Significant Other  tamsulosin (FLOMAX) 0.4 MG CAPS capsule [id number] No Take 0.4 mg by mouth in the morning and at bedtime. [provider] 10/02/2021 Active Spouse/Significant Other             SDOH:  (Social Determinants of Health) assessments and interventions performed:    Care Plan  Review of patient past medical history, allergies, medications, health status, including review of consultants reports, laboratory and other test data, was performed as part of comprehensive evaluation for care management services.   Care Plan : RN Care Manager Plan of Care  Updates made by Barbaraann Faster, RN since 11/01/2021 12:00 AM     Problem: Complex Care Coordination Needs and disease management in patient with stroke, HTN, DM   Priority: High  Onset Date: 09/26/2021     Long-Range Goal: Establish Plan of Care for Management Complex SDOH Barriers, disease management and Care Coordination Needs in patient with stroke, HTN, DM   Start Date: 09/26/2021  This Visit's Progress: On track  Recent Progress: On track  Priority: High  Note:   Current Barriers:  Knowledge Deficits related to plan of care for management of HTN, DMII, and stroke  Care Coordination needs related to Limited education about stroke, HTN, DMII HTN* EMMI stroke red alerts x 2 for appointments (09/26/21 & 10/10/21) resolved 10/10/21  RN CM Clinical Goal(s):  Patient will verbalize understanding of plan for management of HTN, DMII, and stroke as evidenced by voiced improved BP, HgA1c,decrease risk for re admission in next 30 business days  through collaboration with Consulting civil engineer, provider, and care team.   Interventions: Further outreach for care coordination, disease management/education Inter-disciplinary care team collaboration (see longitudinal plan of care) Evaluation of current treatment plan related to  self management and patient's adherence to plan as established by provider 10/10/21 Assessed for concerns with scheduling appointments per new Friday 10/07/21 10 am EMMI stroke red alert referral received today 09/30/21 Denies other needs at  this time   Diabetes Interventions:  (Status:  Goal on track:  Yes.) Short Term Goal Assessed patient's understanding of A1c goal: <7% Discussed plans with patient for ongoing care management follow up and provided patient with direct contact information for care management team Lab Results  Component Value Date   HGBA1C 6.4 (H) 09/22/2021   Hypertension Interventions:  (Status:  Condition stable.  Not addressed this visit.) Long Term Goal Last practice recorded BP readings:  BP Readings from Last 3 Encounters:  10/03/21 118/76  09/23/21 126/65  07/19/21 115/75  Most recent eGFR/CrCl: No results found for: EGFR  No components found for: CRCL  Evaluation of current treatment plan related to hypertension self management and patient's adherence to plan as established by provider  Stroke:  (Status:Goal on track:  Yes.) Long Term Goal Reviewed Importance of taking all medications as prescribed Reviewed Importance of attending all scheduled provider appointments Assessed social determinant of health barriers 11/01/21 assessed for neurology visit  Patient Goals/Self-Care Activities: Take all medications as prescribed Attend all scheduled provider appointments Perform all self care activities independently  Perform IADL's (shopping, preparing meals, housekeeping, managing finances) independently Call provider office for new concerns or questions   Follow Up Plan:  The patient has been provided with contact information for the care management team and has been advised to call with any health related questions or concerns.  The care management team will reach out to the patient again over the next 30 + business  days.  Plan: The patient has been provided with contact information for the care management team and has been advised to call with any health related questions or concerns.  The care management team will reach out to the patient again over the next 30+ business  days.  Jamal Pavon L. Lavina Hamman, RN, BSN, [place] Coordinator Office number [phone] [facility] number [phone] Fax number [phone]

## 2021-11-02 ENCOUNTER — Other Ambulatory Visit: Payer: Medicare Other

## 2021-11-02 DIAGNOSIS — M25512 Pain in left shoulder: Secondary | ICD-10-CM | POA: Diagnosis not present

## 2021-11-15 ENCOUNTER — Other Ambulatory Visit: Payer: Self-pay

## 2021-11-15 ENCOUNTER — Inpatient Hospital Stay (HOSPITAL_BASED_OUTPATIENT_CLINIC_OR_DEPARTMENT_OTHER)
Admission: EM | Admit: 2021-11-15 | Discharge: 2021-11-19 | DRG: 920 | Disposition: A | Discharge status: Home / Self Care | Payer: Medicare Other | Attending: Internal Medicine | Admitting: Internal Medicine

## 2021-11-15 ENCOUNTER — Encounter (HOSPITAL_BASED_OUTPATIENT_CLINIC_OR_DEPARTMENT_OTHER): Payer: Self-pay | Admitting: Emergency Medicine

## 2021-11-15 ENCOUNTER — Emergency Department (HOSPITAL_BASED_OUTPATIENT_CLINIC_OR_DEPARTMENT_OTHER): Payer: Medicare Other

## 2021-11-15 ENCOUNTER — Ambulatory Visit: Payer: Medicare Other | Admitting: *Deleted

## 2021-11-15 DIAGNOSIS — Z8052 Family history of malignant neoplasm of bladder: Secondary | ICD-10-CM | POA: Diagnosis not present

## 2021-11-15 DIAGNOSIS — K651 Peritoneal abscess: Secondary | ICD-10-CM

## 2021-11-15 DIAGNOSIS — R911 Solitary pulmonary nodule: Secondary | ICD-10-CM | POA: Diagnosis present

## 2021-11-15 DIAGNOSIS — Z87891 Personal history of nicotine dependence: Secondary | ICD-10-CM | POA: Diagnosis not present

## 2021-11-15 DIAGNOSIS — R7303 Prediabetes: Secondary | ICD-10-CM | POA: Diagnosis present

## 2021-11-15 DIAGNOSIS — Z7982 Long term (current) use of aspirin: Secondary | ICD-10-CM | POA: Diagnosis not present

## 2021-11-15 DIAGNOSIS — E860 Dehydration: Secondary | ICD-10-CM | POA: Diagnosis present

## 2021-11-15 DIAGNOSIS — Z8551 Personal history of malignant neoplasm of bladder: Secondary | ICD-10-CM | POA: Diagnosis not present

## 2021-11-15 DIAGNOSIS — M6008 Infective myositis, other site: Secondary | ICD-10-CM | POA: Diagnosis not present

## 2021-11-15 DIAGNOSIS — I10 Essential (primary) hypertension: Secondary | ICD-10-CM | POA: Diagnosis present

## 2021-11-15 DIAGNOSIS — D649 Anemia, unspecified: Secondary | ICD-10-CM | POA: Diagnosis present

## 2021-11-15 DIAGNOSIS — G40909 Epilepsy, unspecified, not intractable, without status epilepticus: Secondary | ICD-10-CM | POA: Diagnosis not present

## 2021-11-15 DIAGNOSIS — L02211 Cutaneous abscess of abdominal wall: Secondary | ICD-10-CM | POA: Diagnosis not present

## 2021-11-15 DIAGNOSIS — Y832 Surgical operation with anastomosis, bypass or graft as the cause of abnormal reaction of the patient, or of later complication, without mention of misadventure at the time of the procedure: Secondary | ICD-10-CM | POA: Diagnosis present

## 2021-11-15 DIAGNOSIS — B955 Unspecified streptococcus as the cause of diseases classified elsewhere: Secondary | ICD-10-CM | POA: Diagnosis not present

## 2021-11-15 DIAGNOSIS — Z8673 Personal history of transient ischemic attack (TIA), and cerebral infarction without residual deficits: Secondary | ICD-10-CM

## 2021-11-15 DIAGNOSIS — Z91040 Latex allergy status: Secondary | ICD-10-CM | POA: Diagnosis not present

## 2021-11-15 DIAGNOSIS — E785 Hyperlipidemia, unspecified: Secondary | ICD-10-CM | POA: Diagnosis not present

## 2021-11-15 DIAGNOSIS — R109 Unspecified abdominal pain: Secondary | ICD-10-CM

## 2021-11-15 DIAGNOSIS — Z79899 Other long term (current) drug therapy: Secondary | ICD-10-CM

## 2021-11-15 DIAGNOSIS — Z20822 Contact with and (suspected) exposure to covid-19: Secondary | ICD-10-CM | POA: Diagnosis not present

## 2021-11-15 DIAGNOSIS — B9562 Methicillin resistant Staphylococcus aureus infection as the cause of diseases classified elsewhere: Secondary | ICD-10-CM | POA: Diagnosis not present

## 2021-11-15 DIAGNOSIS — T8579XA Infection and inflammatory reaction due to other internal prosthetic devices, implants and grafts, initial encounter: Secondary | ICD-10-CM | POA: Diagnosis not present

## 2021-11-15 DIAGNOSIS — I700 Atherosclerosis of aorta: Secondary | ICD-10-CM | POA: Diagnosis not present

## 2021-11-15 DIAGNOSIS — N179 Acute kidney failure, unspecified: Secondary | ICD-10-CM | POA: Diagnosis not present

## 2021-11-15 DIAGNOSIS — R079 Chest pain, unspecified: Secondary | ICD-10-CM | POA: Diagnosis not present

## 2021-11-15 DIAGNOSIS — R188 Other ascites: Secondary | ICD-10-CM | POA: Diagnosis not present

## 2021-11-15 DIAGNOSIS — Z888 Allergy status to other drugs, medicaments and biological substances status: Secondary | ICD-10-CM | POA: Diagnosis not present

## 2021-11-15 DIAGNOSIS — K219 Gastro-esophageal reflux disease without esophagitis: Secondary | ICD-10-CM | POA: Diagnosis not present

## 2021-11-15 DIAGNOSIS — Z808 Family history of malignant neoplasm of other organs or systems: Secondary | ICD-10-CM

## 2021-11-15 DIAGNOSIS — N400 Enlarged prostate without lower urinary tract symptoms: Secondary | ICD-10-CM | POA: Diagnosis present

## 2021-11-15 DIAGNOSIS — L03311 Cellulitis of abdominal wall: Secondary | ICD-10-CM | POA: Diagnosis present

## 2021-11-15 LAB — COMPREHENSIVE METABOLIC PANEL
ALT: 12 U/L (ref 0–44)
AST: 11 U/L — ABNORMAL LOW (ref 15–41)
Albumin: 4.4 g/dL (ref 3.5–5.0)
Alkaline Phosphatase: 98 U/L (ref 38–126)
Anion gap: 12 (ref 5–15)
BUN: 26 mg/dL — ABNORMAL HIGH (ref 8–23)
CO2: 23 mmol/L (ref 22–32)
Calcium: 10.3 mg/dL (ref 8.9–10.3)
Chloride: 104 mmol/L (ref 98–111)
Creatinine, Ser: 1.38 mg/dL — ABNORMAL HIGH (ref 0.61–1.24)
GFR, Estimated: 53 mL/min — ABNORMAL LOW (ref >60)
Glucose, Bld: 164 mg/dL — ABNORMAL HIGH (ref 70–99)
Potassium: 4.2 mmol/L (ref 3.5–5.1)
Sodium: 139 mmol/L (ref 135–145)
Total Bilirubin: 0.6 mg/dL (ref 0.3–1.2)
Total Protein: 7.8 g/dL (ref 6.5–8.1)

## 2021-11-15 LAB — CBC
HCT: 37 % — ABNORMAL LOW (ref 39.0–52.0)
Hemoglobin: 12 g/dL — ABNORMAL LOW (ref 13.0–17.0)
MCH: 27.8 pg (ref 26.0–34.0)
MCHC: 32.4 g/dL (ref 30.0–36.0)
MCV: 85.8 fL (ref 80.0–100.0)
Platelets: 281 10*3/uL (ref 150–400)
RBC: 4.31 MIL/uL (ref 4.22–5.81)
RDW: 13.7 % (ref 11.5–15.5)
WBC: 16.7 10*3/uL — ABNORMAL HIGH (ref 4.0–10.5)
nRBC: 0 % (ref 0.0–0.2)

## 2021-11-15 LAB — URINALYSIS, ROUTINE W REFLEX MICROSCOPIC
Bilirubin Urine: NEGATIVE
Glucose, UA: NEGATIVE mg/dL
Hgb urine dipstick: NEGATIVE
Ketones, ur: NEGATIVE mg/dL
Leukocytes,Ua: NEGATIVE
Nitrite: NEGATIVE
Protein, ur: NEGATIVE mg/dL
Specific Gravity, Urine: 1.043 — ABNORMAL HIGH (ref 1.005–1.030)
pH: 6.5 (ref 5.0–8.0)

## 2021-11-15 LAB — LIPASE, BLOOD: Lipase: 12 U/L (ref 11–51)

## 2021-11-15 LAB — RESP PANEL BY RT-PCR (FLU A&B, COVID) ARPGX2
Influenza A by PCR: NEGATIVE
Influenza B by PCR: NEGATIVE
SARS Coronavirus 2 by RT PCR: NEGATIVE

## 2021-11-15 MED ORDER — EZETIMIBE 10 MG PO TABS
10.0000 mg | ORAL_TABLET | Freq: Every day | ORAL | Status: DC
  Administered 2021-11-15 – 2021-11-19 (×4): 10 mg via ORAL

## 2021-11-15 MED ORDER — MORPHINE SULFATE (PF) 2 MG/ML IV SOLN
1.0000 mg | INTRAVENOUS | Status: DC | PRN
  Administered 2021-11-15 – 2021-11-18 (×5): 1 mg via INTRAVENOUS

## 2021-11-15 MED ORDER — LEVETIRACETAM 500 MG PO TABS
500.0000 mg | ORAL_TABLET | Freq: Two times a day (BID) | ORAL | Status: DC
  Administered 2021-11-15 – 2021-11-19 (×8): 500 mg via ORAL

## 2021-11-15 MED ORDER — IOHEXOL 300 MG/ML  SOLN
100.0000 mL | Freq: Once | INTRAMUSCULAR | Status: AC | PRN
  Administered 2021-11-15: 80 mL via INTRAVENOUS

## 2021-11-15 MED ORDER — VANCOMYCIN HCL IN DEXTROSE 1-5 GM/200ML-% IV SOLN
1000.0000 mg | Freq: Once | INTRAVENOUS | Status: AC
  Administered 2021-11-15: 1000 mg via INTRAVENOUS
  Filled 2021-11-15: qty 200

## 2021-11-15 MED ORDER — TAMSULOSIN HCL 0.4 MG PO CAPS
0.4000 mg | ORAL_CAPSULE | Freq: Two times a day (BID) | ORAL | Status: DC
Start: 2021-11-15 — End: 2021-11-17
  Administered 2021-11-15 – 2021-11-16 (×2): 0.4 mg via ORAL

## 2021-11-15 MED ORDER — SODIUM CHLORIDE 0.9 % IV SOLN
2.0000 g | Freq: Every day | INTRAVENOUS | Status: DC
  Administered 2021-11-16 – 2021-11-19 (×4): 2 g via INTRAVENOUS
  Filled 2021-11-15 (×4): qty 20

## 2021-11-15 MED ORDER — PANTOPRAZOLE SODIUM 40 MG PO TBEC
40.0000 mg | DELAYED_RELEASE_TABLET | Freq: Every day | ORAL | Status: DC
  Administered 2021-11-15 – 2021-11-19 (×5): 40 mg via ORAL

## 2021-11-15 MED ORDER — SODIUM CHLORIDE 0.9 % IV SOLN: 1.0000 g | Freq: Every day | INTRAVENOUS | Status: DC

## 2021-11-15 MED ORDER — ACETAMINOPHEN 325 MG PO TABS
650.0000 mg | ORAL_TABLET | Freq: Four times a day (QID) | ORAL | Status: DC | PRN
  Administered 2021-11-16: 650 mg via ORAL

## 2021-11-15 MED ORDER — SODIUM CHLORIDE 0.9 % IV SOLN
1.0000 g | Freq: Once | INTRAVENOUS | Status: AC
  Administered 2021-11-15: 1 g via INTRAVENOUS
  Filled 2021-11-15: qty 10

## 2021-11-15 MED ORDER — VANCOMYCIN HCL 1250 MG/250ML IV SOLN
1250.0000 mg | INTRAVENOUS | Status: DC
Start: 2021-11-15 — End: 2021-11-19
  Administered 2021-11-15 – 2021-11-18 (×4): 1250 mg via INTRAVENOUS
  Filled 2021-11-15 (×4): qty 250

## 2021-11-15 MED ORDER — ACETAMINOPHEN 650 MG RE SUPP
650.0000 mg | Freq: Four times a day (QID) | RECTAL | Status: DC | PRN
Start: 2021-11-15 — End: 2021-11-19

## 2021-11-15 MED ORDER — SODIUM CHLORIDE 0.9 % IV SOLN: INTRAVENOUS | Status: AC

## 2021-11-15 NOTE — H&P (Signed)
History and Physical    Gautham Hewins BMW:[id number] DOB: 1945-10-20 DOA: 11/15/2021  PCP: [name], DO  Patient coming from: [facility]  Chief Complaint: Abdominal pain  HPI: Richard Austin is a 76 y.o. male with medical history significant of CVA, hypertension, hyperlipidemia, bladder cancer, seizure disorder, GERD, BPH presenting with a chief complaint of right lower quadrant abdominal pain x2.5 days.  States it started out as a little bump in that area which has been growing.  He is not having any pain at rest but experiences severe pain every time he moves, sits up in the bed, or coughs/sneezes.  He is having regular bowel movements and has not vomited.  Reports history of multiple abdominal surgeries after he had complicated diverticulitis, including colostomy placement and reversal, exploratory laparotomy, several hernia repair surgeries.  Patient states all of these surgeries were done in [place] several years ago.  He has no other complaints.  Denies fevers, chills, cough, shortness of breath, or chest pain.  ED Course: Vital signs stable.  WBC 16.7.  Hemoglobin 12.0, stable.  Creatinine 1.3, baseline 1.0-1.1.  Blood cultures drawn.  CT showing abnormal fluid collections in both rectus abdominis muscles with overlying subcutaneous edema, appearance suspicious for abscesses or possibly hematoma. ED physician discussed the case with Dr. Donne Hazel from general surgery who recommended consulting IR.  Patient was given vancomycin and ceftriaxone  Review of Systems:  Review of Systems  All other systems reviewed and are negative.  Past Medical History:  Diagnosis Date   Arthritis    Bladder tumor    Diverticulitis    Diverticulosis    Hypertension    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 2019   Pre-diabetes     Past Surgical History:  Procedure Laterality Date   ABDOMINAL SURGERY     ABLATION FOR SVT  2000 OR 2001   APPENDECTOMY  1970s   COLOSTOMY     LATER  REVERSED   IRRIGATION AND DEBRIDEMENT ABSCESS N/A 07/18/2021   Procedure: IRRIGATION AND DEBRIDEMENT ABSCESS;  Surgeon: Irine Seal, MD;  Location: [facility];  Service: Urology;  Laterality: N/A;   LUMBAR LAMINECTOMY  1970s   NESBIT PROCEDURE N/A    TRANSURETHRAL RESECTION OF BLADDER TUMOR WITH MITOMYCIN-C N/A 08/21/2018   Procedure: TRANSURETHRAL RESECTION OF BLADDER TUMOR WITH GEMCITABINE;  Surgeon: Lucas Mallow, MD;  Location: [facility];  Service: Urology;  Laterality: N/A;     reports that he has quit smoking. His smoking use included cigarettes. He has a 45.00 pack-year smoking history. He has never used smokeless tobacco. He reports current alcohol use. He reports that he does not currently use drugs.  Allergies  Allergen Reactions   Amlodipine Other (See Comments) and Swelling    Muscle tightness, fatigue    Amlodipine Besylate     Other reaction(s): Unknown Other reaction(s): Unknown   Nebivolol Hcl Other (See Comments) and Swelling    Muscle tightness and fatigue  Other reaction(s): Unknown Other reaction(s): Unknown   Statins Other (See Comments)    Muscle tightness and fatigue  Other reaction(s): Unknown Other reaction(s): Myositis, Unknown   Latex Rash    Family History  Problem Relation Age of Onset   Peptic Ulcer Mother    Cirrhosis Mother    Alcohol abuse Mother    Bladder Cancer Father    Throat cancer Maternal Grandfather     Prior to Admission medications   Medication Sig Start Date End Date Taking? Authorizing Provider  aspirin EC 81 MG tablet Take 81 mg by mouth daily. Swallow whole.   Yes [provider]  Azelastine HCl 137 MCG/SPRAY SOLN    Yes [provider]  chlorthalidone (HYGROTON) 25 MG tablet Take 25 mg by mouth every morning.   Yes [provider]  ezetimibe (ZETIA) 10 MG tablet Take 1 tablet (10 mg total) by mouth daily. 09/24/21  Yes Shafer, Marcelino Scot, NP  fluticasone (FLONASE) 50 MCG/ACT nasal spray Place 2 sprays into  both nostrils 2 (two) times daily as needed for allergies or rhinitis.    Yes [provider]  ibuprofen (ADVIL) 800 MG tablet Take 800 mg by mouth 2 (two) times daily as needed for headache or moderate pain. 04/10/21  Yes [provider]  Astrid Drafts 1000 MG CAPS    Yes [provider]  levETIRAcetam (KEPPRA) 500 MG tablet Take 1 tablet (500 mg total) by mouth 2 (two) times daily. 10/03/21  Yes Pokhrel, Laxman, MD  lisinopril (PRINIVIL,ZESTRIL) 40 MG tablet Take 40 mg by mouth every morning.   Yes [provider]  Multiple Vitamin (MULTIVITAMIN) tablet Take 1 tablet by mouth daily.   Yes [provider]  omeprazole (PRILOSEC) 40 MG capsule Take 40 mg by mouth daily.   Yes [provider]  Polyvinyl Alcohol-Povidone PF (REFRESH) 1.4-0.6 % SOLN Place 1 drop into both eyes daily as needed (dry eyes).   Yes [provider]  tadalafil (CIALIS) 20 MG tablet Take 20 mg by mouth once as needed (very high blood pressure with fluctuations).   Yes [provider]  tamsulosin (FLOMAX) 0.4 MG CAPS capsule Take 0.4 mg by mouth in the morning and at bedtime.   Yes [provider]  HYDROcodone-acetaminophen (NORCO/VICODIN) 5-325 MG tablet Take 1 tablet by mouth every 8 (eight) hours as needed for pain. Patient not taking: Reported on 11/15/2021 09/20/21   [provider]    Physical Exam: Vitals:   11/15/21 1730 11/15/21 2044 11/15/21 2050 11/16/21 0057  BP: 136/66 (!) 127/58  110/66  Pulse: 94 96  98  Resp: '17 16  16  '$ Temp:  99.6 F (37.6 C)  99.6 F (37.6 C)  TempSrc:  Oral  Oral  SpO2: 97% 96%  96%  Weight:   79.9 kg   Height:   '5\' 8"'$  (1.727 m)     Physical Exam Vitals and nursing note reviewed.  Constitutional:      General: He is not in acute distress. HENT:     Head: Normocephalic and atraumatic.  Eyes:     Extraocular Movements: Extraocular movements intact.     Conjunctiva/sclera: Conjunctivae normal.   Cardiovascular:     Rate and Rhythm: Normal rate and regular rhythm.     Pulses: Normal pulses.  Pulmonary:     Effort: Pulmonary effort is normal. No respiratory distress.     Breath sounds: Normal breath sounds. No wheezing or rales.  Abdominal:     General: Bowel sounds are normal. There is no distension.     Palpations: Abdomen is soft.     Tenderness: There is abdominal tenderness. There is no guarding or rebound.     Comments: Firm tender mass palpated in the right lower quadrant, overlying skin with large area of mild erythema  Musculoskeletal:        General: No swelling or tenderness.     Cervical back: Normal range of motion and neck supple.  Skin:    General: Skin is warm and  dry.  Neurological:     General: No focal deficit present.     Mental Status: He is alert and oriented to person, place, and time.     Labs on Admission: I have personally reviewed following labs and imaging studies  CBC: Recent Labs  Lab 11/15/21 0950  WBC 16.7*  HGB 12.0*  HCT 37.0*  MCV 85.8  PLT 341   Basic Metabolic Panel: Recent Labs  Lab 11/15/21 0950  NA 139  K 4.2  CL 104  CO2 23  GLUCOSE 164*  BUN 26*  CREATININE 1.38*  CALCIUM 10.3   GFR: Estimated Creatinine Clearance: 44.7 mL/min (A) (by C-G formula based on SCr of 1.38 mg/dL (H)). Liver Function Tests: Recent Labs  Lab 11/15/21 0950  AST 11*  ALT 12  ALKPHOS 98  BILITOT 0.6  PROT 7.8  ALBUMIN 4.4   Recent Labs  Lab 11/15/21 0950  LIPASE 12   No results for input(s): AMMONIA in the last 168 hours. Coagulation Profile: No results for input(s): INR, PROTIME in the last 168 hours. Cardiac Enzymes: No results for input(s): CKTOTAL, CKMB, CKMBINDEX, TROPONINI in the last 168 hours. BNP (last 3 results) No results for input(s): PROBNP in the last 8760 hours. HbA1C: No results for input(s): HGBA1C in the last 72 hours. CBG: No results for input(s): GLUCAP in the last 168 hours. Lipid Profile: No  results for input(s): CHOL, HDL, LDLCALC, TRIG, CHOLHDL, LDLDIRECT in the last 72 hours. Thyroid Function Tests: No results for input(s): TSH, T4TOTAL, FREET4, T3FREE, THYROIDAB in the last 72 hours. Anemia Panel: No results for input(s): VITAMINB12, FOLATE, FERRITIN, TIBC, IRON, RETICCTPCT in the last 72 hours. Urine analysis:    Component Value Date/Time   COLORURINE YELLOW 11/15/2021 1410   APPEARANCEUR CLEAR 11/15/2021 1410   LABSPEC 1.043 (H) 11/15/2021 1410   PHURINE 6.5 11/15/2021 1410   GLUCOSEU NEGATIVE 11/15/2021 1410   HGBUR NEGATIVE 11/15/2021 1410   BILIRUBINUR NEGATIVE 11/15/2021 1410   KETONESUR NEGATIVE 11/15/2021 1410   PROTEINUR NEGATIVE 11/15/2021 1410   NITRITE NEGATIVE 11/15/2021 1410   LEUKOCYTESUR NEGATIVE 11/15/2021 1410    Radiological Exams on Admission: I have personally reviewed images CT ABDOMEN PELVIS W CONTRAST  Result Date: 11/15/2021 CLINICAL DATA:  Right lower quadrant abdominal pain swelling for 2.5 days EXAM: CT ABDOMEN AND PELVIS WITH CONTRAST TECHNIQUE: Multidetector CT imaging of the abdomen and pelvis was performed using the standard protocol following bolus administration of intravenous contrast. RADIATION DOSE REDUCTION: This exam was performed according to the departmental dose-optimization program which includes automated exposure control, adjustment of the mA and/or kV according to patient size and/or use of iterative reconstruction technique. CONTRAST:  24m OMNIPAQUE IOHEXOL 300 MG/ML  SOLN COMPARISON:  07/18/2021 FINDINGS: Lower chest: Dense mitral valve calcification and mild aortic valve calcification. Descending thoracic aortic and right coronary artery atherosclerotic calcification. Upper normal heart size. Small inferior pericardial effusion. Small type 1 hiatal hernia. 6 by 4 by 3 mm (volume = 40 mm^3) right lower lobe nodule on image 10 series 4 appears unchanged. Hepatobiliary: Unremarkable Pancreas: Unremarkable Spleen: Unremarkable  Adrenals/Urinary Tract: 8 by 7 mm hypodense lesion in the left mid kidney medially is technically too small to characterize although statistically probably a cyst. Adrenal glands unremarkable. Stomach/Bowel: Small type 1 hiatal hernia. Gastric wall thickening probably secondary to nondistention. There is formed stool in the distal colon. Anastomotic staple line in the sigmoid colon. Scattered air-fluid levels in nondilated small bowel. Vascular/Lymphatic: Atherosclerosis is present, including aortoiliac  atherosclerotic disease. Substantial atheromatous plaque proximally in the celiac trunk and SMA. Reproductive: Linear bandlike scarring in the pubis extending above the penis at the site of the prior fluid collection. Central prostate gland calcifications. Other: No supplemental non-categorized findings. Musculoskeletal: Upper abdominal ventral hernia containing adipose tissue, image 28 series 2. Abnormal and potential loculated fluid collection measuring about 3.0 by 2.8 by 2.3 cm (volume = 10 cm^3) in the right rectus abdominus muscle, image 38 series 6. Abnormal appearance of the left rectus abdominus muscle including a 4.1 by 0.8 by 2.3 cm (volume = 4 cm^3) low-density collection with enhancing margins, and some increase thickening/nodularity in the subcutaneous tissues overlying the left rectus abdominus for example on image 43 of series 2 which represents change from the prior exam. There is some subcutaneous edema overlying both of the rectus abdominus muscles in the regions of abnormal nodular thickening. Spondylosis and degenerative disc disease at all lumbar levels with foraminal impingement most notable bilaterally at L4-5 and to a lesser degree at L3-4. IMPRESSION: 1. Abnormal fluid collections in both rectus abdominus muscles with overlying subcutaneous edema. The appearance could represent abscesses or possibly hematomas. 2. Scarring at the site of prior drained abscess along the pubis. 3. Small upper  abdominal ventral midline hernia containing adipose tissue. 4. Stable 40 cubic mm right lower lobe pulmonary nodule. No follow-up needed if patient is low-risk.This recommendation follows the consensus statement: Guidelines for Management of Incidental Pulmonary Nodules Detected on CT Images: From the Fleischner Society 2017; Radiology 2017; 284:228-243. 5. Other imaging findings of potential clinical significance: Dense mitral valve and mild aortic valve calcifications. Aortic Atherosclerosis (ICD10-I70.0). Systemic atherosclerosis including plaque in the proximal SMA and celiac trunk. Small inferior pericardial effusion. Small type 1 hiatal hernia. Scattered nonspecific air-levels in nondilated small bowel. Multilevel lower lumbar impingement. Electronically Signed   By: Van Clines M.D.   On: 11/15/2021 11:38   DG Chest Port 1 View  Result Date: 11/15/2021 CLINICAL DATA:  Abdominal pain EXAM: PORTABLE CHEST 1 VIEW COMPARISON:  None. FINDINGS: Transverse diameter of heart is slightly increased. Thoracic aorta is tortuous. Lung fields are clear of any infiltrates or pulmonary edema. There is no pleural effusion or pneumothorax. IMPRESSION: There are no signs of pulmonary edema or focal pulmonary consolidation. Electronically Signed   By: Elmer Picker M.D.   On: 11/15/2021 10:57    EKG: Pending at this time.  Assessment/Plan Principal Problem:   Abdominal pain Active Problems:   AKI (acute kidney injury) ([facility])   Pulmonary nodule   BPH (benign prostatic hyperplasia)   GERD (gastroesophageal reflux disease)   HLD (hyperlipidemia)   Seizure disorder (HCC)    Assessment and Plan: * Abdominal pain Rectus sheath abscesses versus hematoma CT showing abnormal fluid collections in both rectus abdominis muscles with overlying subcutaneous edema, appearance suspicious for abscesses or possibly hematoma.  WBC 16.7, no signs of sepsis.  Hemoglobin 12.0, stable. -Continue vancomycin and  ceftriaxone.  Keep n.p.o., IV fluid hydration.  Morphine as needed for pain.  Hold home aspirin.  Blood cultures pending.  Monitor WBC count.  I spoke to Dr. Brantley Stage, general surgery will consult in the morning and recommending IR consultation.  I was not able to reach on-call physician for IR tonight, please try again in the morning.  Pulmonary nodule CT showing stable 40 cubic mm right lower lobe pulmonary nodule. -Outpatient follow-up  AKI (acute kidney injury) (HCC) Likely prerenal from dehydration.  Creatinine 1.3, baseline 1.0-1.1. -IV fluid hydration.  Hold home chlorthalidone and lisinopril.  Repeat BMP in a.m.  Seizure disorder (HCC) -Continue Keppra  HLD (hyperlipidemia) -Continue Zetia  GERD (gastroesophageal reflux disease) -Continue Protonix  BPH (benign prostatic hyperplasia) -Continue Flomax   DVT prophylaxis: SCDs Code Status: Full Code (discussed with the patient) Family Communication: Wife at bedside. Level of care: Med-Surg Admission status: It is my clinical opinion that admission to INPATIENT is reasonable and necessary because of the expectation that this patient will require hospital care that crosses at least 2 midnights to treat this condition based on the medical complexity of the problems presented.  Given the aforementioned information, the predictability of an adverse outcome is felt to be significant.   Shela Leff MD [facility]  If 7PM-7AM, please contact night-coverage [email or web address]  11/16/2021, 1:58 AM

## 2021-11-15 NOTE — ED Provider Notes (Signed)
[facility] EMERGENCY DEPT Provider Note   CSN: [id number] Arrival date & time: 11/15/21  2585     History  Chief Complaint  Patient presents with   Abdominal Pain    Richard Austin is a 76 y.o. male.  HPI 76 yo male ho stroke, hypertension, seizure, multiple abdominal surgeries presents today with rlq swelling and ttp.2.5 days ago.  No n/v/d, having smaller than ususal bm.  Taking po chili and eggs yesterday.  Has not eaten today and has not felt like eating today.  No fever, chills, no urinary change, ho bladder ca. PMD [facility]      Home Medications Prior to Admission medications   Medication Sig Start Date End Date Taking? Authorizing Provider  aspirin EC 81 MG tablet Take 81 mg by mouth daily. Swallow whole.   Yes [provider]  Azelastine HCl 137 MCG/SPRAY SOLN    Yes [provider]  chlorthalidone (HYGROTON) 25 MG tablet Take 25 mg by mouth every morning.   Yes [provider]  ezetimibe (ZETIA) 10 MG tablet Take 1 tablet (10 mg total) by mouth daily. 09/24/21  Yes Shafer, Marcelino Scot, NP  fluticasone (FLONASE) 50 MCG/ACT nasal spray Place 2 sprays into both nostrils 2 (two) times daily as needed for allergies or rhinitis.    Yes [provider]  ibuprofen (ADVIL) 800 MG tablet Take 800 mg by mouth 2 (two) times daily as needed for headache or moderate pain. 04/10/21  Yes [provider]  Astrid Drafts 1000 MG CAPS    Yes [provider]  levETIRAcetam (KEPPRA) 500 MG tablet Take 1 tablet (500 mg total) by mouth 2 (two) times daily. 10/03/21  Yes Pokhrel, Laxman, MD  lisinopril (PRINIVIL,ZESTRIL) 40 MG tablet Take 40 mg by mouth every morning.   Yes [provider]  Multiple Vitamin (MULTIVITAMIN) tablet Take 1 tablet by mouth daily.   Yes [provider]  omeprazole (PRILOSEC) 40 MG capsule Take 40 mg by mouth daily.   Yes [provider]  Polyvinyl  Alcohol-Povidone PF (REFRESH) 1.4-0.6 % SOLN Place 1 drop into both eyes daily as needed (dry eyes).   Yes [provider]  tadalafil (CIALIS) 20 MG tablet Take 20 mg by mouth once as needed (very high blood pressure with fluctuations).   Yes [provider]  tamsulosin (FLOMAX) 0.4 MG CAPS capsule Take 0.4 mg by mouth in the morning and at bedtime.   Yes [provider]  HYDROcodone-acetaminophen (NORCO/VICODIN) 5-325 MG tablet Take 1 tablet by mouth every 8 (eight) hours as needed for pain. Patient not taking: Reported on 11/15/2021 09/20/21   [provider]      Allergies    Amlodipine, Amlodipine besylate, Nebivolol hcl, Statins, and Latex    Review of Systems   Review of Systems  Constitutional:  Negative for chills, fever and unexpected weight change.  HENT:  Negative for congestion.   Eyes: Negative.   Respiratory: Negative.  Negative for shortness of breath.   Cardiovascular: Negative.  Negative for chest pain.  Gastrointestinal:  Positive for abdominal pain. Negative for vomiting.  Endocrine: Negative.   Genitourinary: Negative.   Musculoskeletal: Negative.   Neurological: Negative.   Hematological: Negative.   Psychiatric/Behavioral: Negative.     Physical Exam Updated Vital Signs BP 135/68    Pulse 83    Temp 98.6 F (37 C)    Resp 16    Ht 1.727 m (5' 8")  Wt 81.2 kg    SpO2 97%    BMI 27.22 kg/m  Physical Exam Vitals and nursing note reviewed.  Constitutional:      General: He is not in acute distress.    Appearance: He is well-developed.  HENT:     Head: Normocephalic.     Mouth/Throat:     Mouth: Mucous membranes are moist.  Eyes:     Extraocular Movements: Extraocular movements intact.  Cardiovascular:     Rate and Rhythm: Normal rate and regular rhythm.     Heart sounds: Normal heart sounds.  Pulmonary:     Effort: Pulmonary effort is normal.     Breath sounds: Normal breath sounds.  Abdominal:     General: Abdomen  is flat. Bowel sounds are normal.     Palpations: Abdomen is soft.     Comments: Well delineated erythema 10 x 15 cm right lower quadrant with underlying firm tender mass  Skin:    General: Skin is warm.     Capillary Refill: Capillary refill takes less than 2 seconds.     Comments: Erythematous area on abdominal wall  Neurological:     General: No focal deficit present.     Mental Status: He is alert.  Psychiatric:        Mood and Affect: Mood normal.    ED Results / Procedures / Treatments   Labs (all labs ordered are listed, but only abnormal results are displayed) Labs Reviewed  COMPREHENSIVE METABOLIC PANEL - Abnormal; Notable for the following components:      Result Value   Glucose, Bld 164 (*)    BUN 26 (*)    Creatinine, Ser 1.38 (*)    AST 11 (*)    GFR, Estimated 53 (*)    All other components within normal limits  CBC - Abnormal; Notable for the following components:   WBC 16.7 (*)    Hemoglobin 12.0 (*)    HCT 37.0 (*)    All other components within normal limits  RESP PANEL BY RT-PCR (FLU A&B, COVID) ARPGX2  CULTURE, BLOOD (ROUTINE X 2)  CULTURE, BLOOD (ROUTINE X 2)  LIPASE, BLOOD  URINALYSIS, ROUTINE W REFLEX MICROSCOPIC    EKG None  Radiology CT ABDOMEN PELVIS W CONTRAST  Result Date: 11/15/2021 CLINICAL DATA:  Right lower quadrant abdominal pain swelling for 2.5 days EXAM: CT ABDOMEN AND PELVIS WITH CONTRAST TECHNIQUE: Multidetector CT imaging of the abdomen and pelvis was performed using the standard protocol following bolus administration of intravenous contrast. RADIATION DOSE REDUCTION: This exam was performed according to the departmental dose-optimization program which includes automated exposure control, adjustment of the mA and/or kV according to patient size and/or use of iterative reconstruction technique. CONTRAST:  69m OMNIPAQUE IOHEXOL 300 MG/ML  SOLN COMPARISON:  07/18/2021 FINDINGS: Lower chest: Dense mitral valve calcification and mild  aortic valve calcification. Descending thoracic aortic and right coronary artery atherosclerotic calcification. Upper normal heart size. Small inferior pericardial effusion. Small type 1 hiatal hernia. 6 by 4 by 3 mm (volume = 40 mm^3) right lower lobe nodule on image 10 series 4 appears unchanged. Hepatobiliary: Unremarkable Pancreas: Unremarkable Spleen: Unremarkable Adrenals/Urinary Tract: 8 by 7 mm hypodense lesion in the left mid kidney medially is technically too small to characterize although statistically probably a cyst. Adrenal glands unremarkable. Stomach/Bowel: Small type 1 hiatal hernia. Gastric wall thickening probably secondary to nondistention. There is formed stool in the distal colon. Anastomotic staple line in the sigmoid colon. Scattered air-fluid levels  in nondilated small bowel. Vascular/Lymphatic: Atherosclerosis is present, including aortoiliac atherosclerotic disease. Substantial atheromatous plaque proximally in the celiac trunk and SMA. Reproductive: Linear bandlike scarring in the pubis extending above the penis at the site of the prior fluid collection. Central prostate gland calcifications. Other: No supplemental non-categorized findings. Musculoskeletal: Upper abdominal ventral hernia containing adipose tissue, image 28 series 2. Abnormal and potential loculated fluid collection measuring about 3.0 by 2.8 by 2.3 cm (volume = 10 cm^3) in the right rectus abdominus muscle, image 38 series 6. Abnormal appearance of the left rectus abdominus muscle including a 4.1 by 0.8 by 2.3 cm (volume = 4 cm^3) low-density collection with enhancing margins, and some increase thickening/nodularity in the subcutaneous tissues overlying the left rectus abdominus for example on image 43 of series 2 which represents change from the prior exam. There is some subcutaneous edema overlying both of the rectus abdominus muscles in the regions of abnormal nodular thickening. Spondylosis and degenerative disc  disease at all lumbar levels with foraminal impingement most notable bilaterally at L4-5 and to a lesser degree at L3-4. IMPRESSION: 1. Abnormal fluid collections in both rectus abdominus muscles with overlying subcutaneous edema. The appearance could represent abscesses or possibly hematomas. 2. Scarring at the site of prior drained abscess along the pubis. 3. Small upper abdominal ventral midline hernia containing adipose tissue. 4. Stable 40 cubic mm right lower lobe pulmonary nodule. No follow-up needed if patient is low-risk.This recommendation follows the consensus statement: Guidelines for Management of Incidental Pulmonary Nodules Detected on CT Images: From the Fleischner Society 2017; Radiology 2017; 284:228-243. 5. Other imaging findings of potential clinical significance: Dense mitral valve and mild aortic valve calcifications. Aortic Atherosclerosis (ICD10-I70.0). Systemic atherosclerosis including plaque in the proximal SMA and celiac trunk. Small inferior pericardial effusion. Small type 1 hiatal hernia. Scattered nonspecific air-levels in nondilated small bowel. Multilevel lower lumbar impingement. Electronically Signed   By: Van Clines M.D.   On: 11/15/2021 11:38   DG Chest Port 1 View  Result Date: 11/15/2021 CLINICAL DATA:  Abdominal pain EXAM: PORTABLE CHEST 1 VIEW COMPARISON:  None. FINDINGS: Transverse diameter of heart is slightly increased. Thoracic aorta is tortuous. Lung fields are clear of any infiltrates or pulmonary edema. There is no pleural effusion or pneumothorax. IMPRESSION: There are no signs of pulmonary edema or focal pulmonary consolidation. Electronically Signed   By: Elmer Picker M.D.   On: 11/15/2021 10:57    Procedures Procedures    Medications Ordered in ED Medications  vancomycin (VANCOCIN) IVPB 1000 mg/200 mL premix (has no administration in time range)  cefTRIAXone (ROCEPHIN) 1 g in sodium chloride 0.9 % 100 mL IVPB (has no administration in  time range)  iohexol (OMNIPAQUE) 300 MG/ML solution 100 mL (80 mLs Intravenous Contrast Given 11/15/21 1109)    ED Course/ Medical Decision Making/ A&P Clinical Course as of 11/15/21 1404  Tue Nov 15, 2021  1401 CBC(!) CBC reviewed interpreted significant for leukocytosis at 16,700  [DR]  1401 Comprehensive metabolic panel(!) [DR]  2440 C-Met reviewed and interpreted and significant for hyperglycemia at 164 Creatinine elevated at 138 but appears stable from prior [DR]  [place] [DR]  1402 CT of abdomen reviewed and radiologist interpretation reviewed consistent with abscess with overlying cellulitis [DR]    Clinical Course User Index [DR] Pattricia Boss, MD  Medical Decision Making 76 year old male presents today with abdominal pain.  Has redness, swelling, and tenderness of his abdominal wall.  CT obtained significant for intra rectus sheath fluid most consistent with abscess.  There is some question of contralateral involvement.  Patient received IV antibiotics of Rocephin and vancomycin here.  He has a significant leukocytosis. Care discussed with Dr. Donne Hazel, on-call for general surgery.  He advises that patient likely best served with IR consult. Discussed with hospitalist, Dr. Dwyane Dee who is excepted the patient to [facility] long.  He will consult general surgery on arrival there and interventional radiology. I have discussed the above with the patient and he voices understanding of the plan.  Amount and/or Complexity of Data Reviewed Labs: ordered. Decision-making details documented in ED Course. Radiology: ordered and independent interpretation performed. Decision-making details documented in ED Course. Discussion of management or test interpretation with external provider(s): Discussed care with Dr. Donne Hazel, on-call for general surgery Discussed care with Dr. Shawna Clamp, on-call for hospitalist.  Risk Prescription drug  management.  Patient given IV Rocephin and vancomycin with consult to pharmacy        Final Clinical Impression(s) / ED Diagnoses Final diagnoses:  Abdominal wall abscess  Cellulitis, abdominal wall    Rx / DC Orders ED Discharge Orders     None         Pattricia Boss, MD 11/15/21 1405

## 2021-11-15 NOTE — ED Triage Notes (Signed)
Patient arrives ambulatory POV c/o right lower side abdominal pain and swelling x 2.5 days. Reports hx of SBO and diverticulitis.  ?

## 2021-11-15 NOTE — Progress Notes (Signed)
Pharmacy Antibiotic Note ? ?Richard Austin is a 76 y.o. male with multiple prior abd surgeries, admitted on 11/15/2021 with fluid collections in rectus sheath.  Pharmacy has been consulted for vancomycin dosing for suspected abscess vs (infected?) hematoma. Rocephin per MD. Baseline SCr appears to be ~1.1; slightly higher on admit. ? ?Plan: ?Vancomycin 1000 mg IV now, then 1250 mg IV q24 hr (est AUC 521 based on SCr 1.38; Vd 0.72) ?Measure vancomycin AUC at steady state as indicated ?SCr q48 while on vanc ?Will increase Rocephin to 2g IV daily given undrained abdominal abscess; can re-evaluate dosing once source control achieved ? ?Height: '5\' 8"'$  (172.7 cm) ?Weight: 79.9 kg (176 lb 3.2 oz) ?IBW/kg (Calculated) : 68.4 ? ?Temp (24hrs), Avg:99.1 ?F (37.3 ?C), Min:98.6 ?F (37 ?C), Max:99.6 ?F (37.6 ?C) ? ?Recent Labs  ?Lab 11/15/21 ?0950  ?WBC 16.7*  ?CREATININE 1.38*  ?  ?Estimated Creatinine Clearance: 44.7 mL/min (A) (by C-G formula based on SCr of 1.38 mg/dL (H)).   ? ?Allergies  ?Allergen Reactions  ? Amlodipine Other (See Comments) and Swelling  ?  Muscle tightness, fatigue   ? Amlodipine Besylate   ?  Other reaction(s): Unknown ?Other reaction(s): Unknown  ? Nebivolol Hcl Other (See Comments) and Swelling  ?  Muscle tightness and fatigue  ?Other reaction(s): Unknown ?Other reaction(s): Unknown  ? Statins Other (See Comments)  ?  Muscle tightness and fatigue  ?Other reaction(s): Unknown ?Other reaction(s): Myositis, Unknown  ? Latex Rash  ? ? ?Antimicrobials this admission: ?3/7 vancomycin >>  ?3/7 Rocephin >>  ? ?Dose adjustments this admission: ? ?Microbiology results: ?3/7 BCx: sent ? ?Thank you for allowing pharmacy to be a part of this patient?s care. ? ?Mikeisha Lemonds A ?11/15/2021 9:00 PM ? ?

## 2021-11-16 ENCOUNTER — Inpatient Hospital Stay (HOSPITAL_COMMUNITY): Payer: Medicare Other

## 2021-11-16 ENCOUNTER — Encounter (HOSPITAL_COMMUNITY): Payer: Self-pay | Admitting: Family Medicine

## 2021-11-16 ENCOUNTER — Ambulatory Visit: Payer: Medicare Other | Admitting: Neurology

## 2021-11-16 DIAGNOSIS — G40909 Epilepsy, unspecified, not intractable, without status epilepticus: Secondary | ICD-10-CM

## 2021-11-16 DIAGNOSIS — R911 Solitary pulmonary nodule: Secondary | ICD-10-CM

## 2021-11-16 DIAGNOSIS — R109 Unspecified abdominal pain: Secondary | ICD-10-CM | POA: Diagnosis not present

## 2021-11-16 DIAGNOSIS — D649 Anemia, unspecified: Secondary | ICD-10-CM

## 2021-11-16 LAB — CBC
HCT: 34.4 % — ABNORMAL LOW (ref 39.0–52.0)
Hemoglobin: 10.9 g/dL — ABNORMAL LOW (ref 13.0–17.0)
MCH: 28.3 pg (ref 26.0–34.0)
MCHC: 31.7 g/dL (ref 30.0–36.0)
MCV: 89.4 fL (ref 80.0–100.0)
Platelets: 238 10*3/uL (ref 150–400)
RBC: 3.85 MIL/uL — ABNORMAL LOW (ref 4.22–5.81)
RDW: 13.9 % (ref 11.5–15.5)
WBC: 16.8 10*3/uL — ABNORMAL HIGH (ref 4.0–10.5)
nRBC: 0 % (ref 0.0–0.2)

## 2021-11-16 LAB — BASIC METABOLIC PANEL
Anion gap: 11 (ref 5–15)
BUN: 27 mg/dL — ABNORMAL HIGH (ref 8–23)
CO2: 22 mmol/L (ref 22–32)
Calcium: 9.1 mg/dL (ref 8.9–10.3)
Chloride: 102 mmol/L (ref 98–111)
Creatinine, Ser: 1.46 mg/dL — ABNORMAL HIGH (ref 0.61–1.24)
GFR, Estimated: 50 mL/min — ABNORMAL LOW (ref >60)
Glucose, Bld: 133 mg/dL — ABNORMAL HIGH (ref 70–99)
Potassium: 3.6 mmol/L (ref 3.5–5.1)
Sodium: 135 mmol/L (ref 135–145)

## 2021-11-16 MED ORDER — SODIUM CHLORIDE 0.9% FLUSH
5.0000 mL | Freq: Three times a day (TID) | INTRAVENOUS | Status: DC
  Administered 2021-11-17: 3 mL
  Administered 2021-11-18: 5 mL
  Administered 2021-11-18: 3 mL
  Administered 2021-11-19 (×2): 5 mL

## 2021-11-16 MED ORDER — LACTATED RINGERS IV SOLN: INTRAVENOUS | Status: DC

## 2021-11-16 MED ORDER — FENTANYL CITRATE (PF) 100 MCG/2ML IJ SOLN
INTRAMUSCULAR | Status: AC | PRN
  Administered 2021-11-16: 50 ug via INTRAVENOUS

## 2021-11-16 MED ORDER — MIDAZOLAM HCL 2 MG/2ML IJ SOLN
INTRAMUSCULAR | Status: AC | PRN
  Administered 2021-11-16: 1 mg via INTRAVENOUS

## 2021-11-16 MED ORDER — MIDAZOLAM HCL 2 MG/2ML IJ SOLN
INTRAMUSCULAR | Status: AC | PRN
  Administered 2021-11-16: .5 mg via INTRAVENOUS

## 2021-11-16 MED ORDER — LIDOCAINE HCL (PF) 1 % IJ SOLN
INTRAMUSCULAR | Status: AC | PRN
  Administered 2021-11-16: 10 mL via INTRADERMAL

## 2021-11-16 MED ORDER — MIDAZOLAM HCL 2 MG/2ML IJ SOLN
INTRAMUSCULAR | Status: AC | PRN
Start: 2021-11-16 — End: 2021-11-16
  Administered 2021-11-16: .5 mg via INTRAVENOUS

## 2021-11-16 NOTE — Consult Note (Signed)
Chief Complaint: Rectus sheath fluid collection  Referring Physician(s): Saverio Danker, PA-C  Supervising Physician: Markus Daft  Patient Status: [facility] - In-pt  History of Present Illness Richard Austin is a 76 y.o. male with medical issues including CVA, hypertension, hyperlipidemia, bladder cancer, seizure disorder, GERD, and BPH.  He presented to the ED complaining of right lower quadrant pain.    He states it started out as a little bump and it has kept getting larger.  He has a history of multiple abdominal surgeries for diverticulitis including colostomy placement and reversal, exploratory laparotomy, several hernia repair surgeries.   He tells me he recently had an "abscess drained" in the suprapubic area and the surgeon told him it was a "suture that didn't dissolve".      We are asked to evaluate for aspiration.  He is NPO. He reports a fever, denies chills, N/V, otherwise ROS is negative.  Past Medical History:  Diagnosis Date   Arthritis    Bladder tumor    Diverticulitis    Diverticulosis    Hypertension    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 2019   Pre-diabetes     Past Surgical History:  Procedure Laterality Date   ABDOMINAL SURGERY     ABLATION FOR SVT  2000 OR 2001   APPENDECTOMY  1970s   COLOSTOMY     LATER REVERSED   IRRIGATION AND DEBRIDEMENT ABSCESS N/A 07/18/2021   Procedure: IRRIGATION AND DEBRIDEMENT ABSCESS;  Surgeon: Irine Seal, MD;  Location: [facility];  Service: Urology;  Laterality: N/A;   LUMBAR LAMINECTOMY  1970s   NESBIT PROCEDURE N/A    TRANSURETHRAL RESECTION OF BLADDER TUMOR WITH MITOMYCIN-C N/A 08/21/2018   Procedure: TRANSURETHRAL RESECTION OF BLADDER TUMOR WITH GEMCITABINE;  Surgeon: Lucas Mallow, MD;  Location: [facility];  Service: Urology;  Laterality: N/A;    Allergies: Amlodipine, Amlodipine besylate, Nebivolol hcl, Statins, and Latex  Medications: Prior to Admission medications   Medication Sig Start  Date End Date Taking? Authorizing Provider  aspirin EC 81 MG tablet Take 81 mg by mouth daily. Swallow whole.   Yes [provider]  Azelastine HCl 137 MCG/SPRAY SOLN    Yes [provider]  chlorthalidone (HYGROTON) 25 MG tablet Take 25 mg by mouth every morning.   Yes [provider]  ezetimibe (ZETIA) 10 MG tablet Take 1 tablet (10 mg total) by mouth daily. 09/24/21  Yes Shafer, Marcelino Scot, NP  fluticasone (FLONASE) 50 MCG/ACT nasal spray Place 2 sprays into both nostrils 2 (two) times daily as needed for allergies or rhinitis.    Yes [provider]  ibuprofen (ADVIL) 800 MG tablet Take 800 mg by mouth 2 (two) times daily as needed for headache or moderate pain. 04/10/21  Yes [provider]  Astrid Drafts 1000 MG CAPS    Yes [provider]  levETIRAcetam (KEPPRA) 500 MG tablet Take 1 tablet (500 mg total) by mouth 2 (two) times daily. 10/03/21  Yes Pokhrel, Laxman, MD  lisinopril (PRINIVIL,ZESTRIL) 40 MG tablet Take 40 mg by mouth every morning.   Yes [provider]  Multiple Vitamin (MULTIVITAMIN) tablet Take 1 tablet by mouth daily.   Yes [provider]  omeprazole (PRILOSEC) 40 MG capsule Take 40 mg by mouth daily.   Yes [provider]  Polyvinyl Alcohol-Povidone PF (REFRESH) 1.4-0.6 % SOLN Place 1 drop into both eyes daily as needed (dry eyes).   Yes [provider]  tadalafil (CIALIS) 20  MG tablet Take 20 mg by mouth once as needed (very high blood pressure with fluctuations).   Yes [provider]  tamsulosin (FLOMAX) 0.4 MG CAPS capsule Take 0.4 mg by mouth in the morning and at bedtime.   Yes [provider]  HYDROcodone-acetaminophen (NORCO/VICODIN) 5-325 MG tablet Take 1 tablet by mouth every 8 (eight) hours as needed for pain. Patient not taking: Reported on 11/15/2021 09/20/21   [provider]     Family History  Problem Relation Age of Onset   Peptic Ulcer Mother     Cirrhosis Mother    Alcohol abuse Mother    Bladder Cancer Father    Throat cancer Maternal Grandfather     Social History   Socioeconomic History   Marital status: Married    Spouse name: Not on file   Number of children: Not on file   Years of education: Not on file   Highest education level: Not on file  Occupational History   Not on file  Tobacco Use   Smoking status: Former    Packs/day: 1.50    Years: 30.00    Pack years: 45.00    Types: Cigarettes   Smokeless tobacco: Never   Tobacco comments:    QUIT 15 YRS AGO  Vaping Use   Vaping Use: Never used  Substance and Sexual Activity   Alcohol use: Yes    Comment: SELDOM; several shots of bourbon 07/17/2021   Drug use: Not Currently   Sexual activity: Not on file  Other Topics Concern   Not on file  Social History Narrative   Not on file   Social Determinants of Health   Financial Resource Strain: Low Risk    Difficulty of Paying Living Expenses: Not hard at all  Food Insecurity: No Food Insecurity   Worried About Charity fundraiser in the Last Year: Never true   [place] in the Last Year: Never true  Transportation Needs: No Transportation Needs   Lack of Transportation (Medical): No   Lack of Transportation (Non-Medical): No  Physical Activity: Not on file  Stress: No Stress Concern Present   Feeling of Stress : Only a little  Social Connections: Not on file     Review of Systems: A 12 point ROS discussed and pertinent positives are indicated in the HPI above.  All other systems are negative.  Review of Systems  Vital Signs: BP (!) 113/59 (BP Location: Left Arm)    Pulse 88    Temp 98.4 F (36.9 C) (Oral)    Resp 20    Ht '5\' 8"'$  (1.727 m)    Wt 176 lb 3.2 oz (79.9 kg)    SpO2 96%    BMI 26.79 kg/m   Physical Exam Vitals reviewed.  Constitutional:      Appearance: Normal appearance.  HENT:     Head: Normocephalic and atraumatic.  Eyes:     Extraocular Movements: Extraocular movements  intact.  Cardiovascular:     Rate and Rhythm: Normal rate and regular rhythm.  Pulmonary:     Effort: Pulmonary effort is normal. No respiratory distress.     Breath sounds: Normal breath sounds.  Abdominal:     General: There is no distension.     Palpations: Abdomen is soft.     Tenderness: There is abdominal tenderness.     Comments: Erythema present over right mid abdomen. Very tender to touch  Musculoskeletal:        General:  Normal range of motion.     Cervical back: Normal range of motion.  Skin:    General: Skin is warm and dry.  Neurological:     General: No focal deficit present.     Mental Status: He is alert and oriented to person, place, and time.  Psychiatric:        Mood and Affect: Mood normal.        Behavior: Behavior normal.        Thought Content: Thought content normal.        Judgment: Judgment normal.    Imaging: CT ABDOMEN PELVIS W CONTRAST  Result Date: 11/15/2021 CLINICAL DATA:  Right lower quadrant abdominal pain swelling for 2.5 days EXAM: CT ABDOMEN AND PELVIS WITH CONTRAST TECHNIQUE: Multidetector CT imaging of the abdomen and pelvis was performed using the standard protocol following bolus administration of intravenous contrast. RADIATION DOSE REDUCTION: This exam was performed according to the departmental dose-optimization program which includes automated exposure control, adjustment of the mA and/or kV according to patient size and/or use of iterative reconstruction technique. CONTRAST:  66m OMNIPAQUE IOHEXOL 300 MG/ML  SOLN COMPARISON:  07/18/2021 FINDINGS: Lower chest: Dense mitral valve calcification and mild aortic valve calcification. Descending thoracic aortic and right coronary artery atherosclerotic calcification. Upper normal heart size. Small inferior pericardial effusion. Small type 1 hiatal hernia. 6 by 4 by 3 mm (volume = 40 mm^3) right lower lobe nodule on image 10 series 4 appears unchanged. Hepatobiliary: Unremarkable Pancreas:  Unremarkable Spleen: Unremarkable Adrenals/Urinary Tract: 8 by 7 mm hypodense lesion in the left mid kidney medially is technically too small to characterize although statistically probably a cyst. Adrenal glands unremarkable. Stomach/Bowel: Small type 1 hiatal hernia. Gastric wall thickening probably secondary to nondistention. There is formed stool in the distal colon. Anastomotic staple line in the sigmoid colon. Scattered air-fluid levels in nondilated small bowel. Vascular/Lymphatic: Atherosclerosis is present, including aortoiliac atherosclerotic disease. Substantial atheromatous plaque proximally in the celiac trunk and SMA. Reproductive: Linear bandlike scarring in the pubis extending above the penis at the site of the prior fluid collection. Central prostate gland calcifications. Other: No supplemental non-categorized findings. Musculoskeletal: Upper abdominal ventral hernia containing adipose tissue, image 28 series 2. Abnormal and potential loculated fluid collection measuring about 3.0 by 2.8 by 2.3 cm (volume = 10 cm^3) in the right rectus abdominus muscle, image 38 series 6. Abnormal appearance of the left rectus abdominus muscle including a 4.1 by 0.8 by 2.3 cm (volume = 4 cm^3) low-density collection with enhancing margins, and some increase thickening/nodularity in the subcutaneous tissues overlying the left rectus abdominus for example on image 43 of series 2 which represents change from the prior exam. There is some subcutaneous edema overlying both of the rectus abdominus muscles in the regions of abnormal nodular thickening. Spondylosis and degenerative disc disease at all lumbar levels with foraminal impingement most notable bilaterally at L4-5 and to a lesser degree at L3-4. IMPRESSION: 1. Abnormal fluid collections in both rectus abdominus muscles with overlying subcutaneous edema. The appearance could represent abscesses or possibly hematomas. 2. Scarring at the site of prior drained abscess  along the pubis. 3. Small upper abdominal ventral midline hernia containing adipose tissue. 4. Stable 40 cubic mm right lower lobe pulmonary nodule. No follow-up needed if patient is low-risk.This recommendation follows the consensus statement: Guidelines for Management of Incidental Pulmonary Nodules Detected on CT Images: From the Fleischner Society 2017; Radiology 2017; 284:228-243. 5. Other imaging findings of potential clinical significance: Dense mitral  valve and mild aortic valve calcifications. Aortic Atherosclerosis (ICD10-I70.0). Systemic atherosclerosis including plaque in the proximal SMA and celiac trunk. Small inferior pericardial effusion. Small type 1 hiatal hernia. Scattered nonspecific air-levels in nondilated small bowel. Multilevel lower lumbar impingement. Electronically Signed   By: Van Clines M.D.   On: 11/15/2021 11:38   DG Chest Port 1 View  Result Date: 11/15/2021 CLINICAL DATA:  Abdominal pain EXAM: PORTABLE CHEST 1 VIEW COMPARISON:  None. FINDINGS: Transverse diameter of heart is slightly increased. Thoracic aorta is tortuous. Lung fields are clear of any infiltrates or pulmonary edema. There is no pleural effusion or pneumothorax. IMPRESSION: There are no signs of pulmonary edema or focal pulmonary consolidation. Electronically Signed   By: Elmer Picker M.D.   On: 11/15/2021 10:57    Labs:  CBC: Recent Labs    10/02/21 1740 10/02/21 1745 10/03/21 0523 11/15/21 0950 11/16/21 0330  WBC 10.0  --  9.8 16.7* 16.8*  HGB 12.2* 12.9* 11.8* 12.0* 10.9*  HCT 37.1* 38.0* 36.0* 37.0* 34.4*  PLT 344  --  285 281 238    COAGS: Recent Labs    09/21/21 1924 10/02/21 1740  INR 0.9 0.9  APTT 29 29    BMP: Recent Labs    10/02/21 1740 10/02/21 1745 10/03/21 0523 11/15/21 0950 11/16/21 0330  NA 138 140 140 139 135  K 4.0 4.1 3.9 4.2 3.6  CL 106 108 108 104 102  CO2 20*  --  '23 23 22  '$ GLUCOSE 102* 101* 127* 164* 133*  BUN 28* 29* 22 26* 27*  CALCIUM  9.8  --  9.3 10.3 9.1  CREATININE 1.38* 1.40* 1.12 1.38* 1.46*  GFRNONAA 53*  --  >60 53* 50*    LIVER FUNCTION TESTS: Recent Labs    07/18/21 1221 09/21/21 1924 10/02/21 1740 11/15/21 0950  BILITOT 0.7 0.4 0.4 0.6  AST 14* 22 18 11*  ALT '15 24 18 12  '$ ALKPHOS 73 87 96 98  PROT 7.1 6.5 7.1 7.8  ALBUMIN 4.0 3.5 3.8 4.4    TUMOR MARKERS: No results for input(s): AFPTM, CEA, CA199, CHROMGRNA in the last 8760 hours.  Assessment and Plan:  Bilateral rectus sheath fluid collections.  Images reviewed by Dr. Anselm Pancoast. Will proceed with image guided aspiration (possible drain placement although that is unlikely).  Risks and benefits of aspiration were discussed with the patient including bleeding, infection, damage to adjacent structures, and sepsis.  All of the patient's questions were answered, patient is agreeable to proceed. Consent signed and in chart.  Thank you for allowing our service to participate in Fatih Stalvey 's care.  Electronically Signed: Murrell Redden, PA-C   11/16/2021, 10:54 AM      I spent a total of 40 Minutes in face to face in clinical consultation, greater than 50% of which was counseling/coordinating care for Image guided aspiration of abdominal fluid collection.

## 2021-11-16 NOTE — Assessment & Plan Note (Signed)
Continue Protonix °

## 2021-11-16 NOTE — Consult Note (Signed)
[facility] Surgery Consult Note  Zac Torti 1946/05/24  [id number].    Requesting MD: Niel Hummer Chief Complaint/Reason for Consult: rectus sheath abscess  HPI:  Richard Austin  is a 76yo male PMH HTN and h/o stroke 09/2021 who presented to [facility] yesterday complaining of abdominal pain. He has a complex abdominal surgical history including emergent partial colon resection for diverticulitis in 2007; he leaked and was taken back for a colostomy; colostomy reversal 7 months later; developed a hernia and was repaired with mesh which got infected and required multiple procedures to for removal; primary repair of ventral hernia 2014. All of these surgeries were done in [place]. Most recent surgery was I&D of suprapubic abscess from retained stitch 07/2021 by Dr. Jeffie Pollock. States that over the last few months he has had some swelling and pain on the right side of his abdomen. It resolved without intervention. Three days ago this happened again but would not go away. Denies any trauma to this area. Pain and swelling have gotten worse. It is constant, worse with palpation or movement. Denies any drainage from this area. He reports a low grade fever.  In the ED he underwent CT scan which shows abnormal fluid collections in both rectus abdominus muscles with overlying subcutaneous edema possibly representing abscesses or hematomas. WBC 16.7. General surgery asked to see.  Anticoagulants: aspirin '81mg'$  Nonsmoker Employment: retired  ROS: Review of Systems  Constitutional:  Positive for fever.  Gastrointestinal:  Positive for abdominal pain. Negative for nausea and vomiting.   All systems reviewed and otherwise negative except for as above  Family History  Problem Relation Age of Onset   Peptic Ulcer Mother    Cirrhosis Mother    Alcohol abuse Mother    Bladder Cancer Father    Throat cancer Maternal Grandfather     Past Medical History:  Diagnosis Date   Arthritis     Bladder tumor    Diverticulitis    Diverticulosis    Hypertension    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 2019   Pre-diabetes     Past Surgical History:  Procedure Laterality Date   ABDOMINAL SURGERY     ABLATION FOR SVT  2000 OR 2001   APPENDECTOMY  1970s   COLOSTOMY     LATER REVERSED   IRRIGATION AND DEBRIDEMENT ABSCESS N/A 07/18/2021   Procedure: IRRIGATION AND DEBRIDEMENT ABSCESS;  Surgeon: Irine Seal, MD;  Location: [facility];  Service: Urology;  Laterality: N/A;   LUMBAR LAMINECTOMY  1970s   NESBIT PROCEDURE N/A    TRANSURETHRAL RESECTION OF BLADDER TUMOR WITH MITOMYCIN-C N/A 08/21/2018   Procedure: TRANSURETHRAL RESECTION OF BLADDER TUMOR WITH GEMCITABINE;  Surgeon: Lucas Mallow, MD;  Location: [facility];  Service: Urology;  Laterality: N/A;    Social History:  reports that he has quit smoking. His smoking use included cigarettes. He has a 45.00 pack-year smoking history. He has never used smokeless tobacco. He reports current alcohol use. He reports that he does not currently use drugs.  Allergies:  Allergies  Allergen Reactions   Amlodipine Other (See Comments) and Swelling    Muscle tightness, fatigue    Amlodipine Besylate     Other reaction(s): Unknown Other reaction(s): Unknown   Nebivolol Hcl Other (See Comments) and Swelling    Muscle tightness and fatigue  Other reaction(s): Unknown Other reaction(s): Unknown   Statins Other (See Comments)    Muscle tightness and fatigue  Other reaction(s): Unknown Other reaction(s): Myositis, Unknown  Latex Rash    Medications Prior to Admission  Medication Sig Dispense Refill   aspirin EC 81 MG tablet Take 81 mg by mouth daily. Swallow whole.     Azelastine HCl 137 MCG/SPRAY SOLN      chlorthalidone (HYGROTON) 25 MG tablet Take 25 mg by mouth every morning.     ezetimibe (ZETIA) 10 MG tablet Take 1 tablet (10 mg total) by mouth daily. 30 tablet 1   fluticasone (FLONASE) 50 MCG/ACT nasal spray Place 2 sprays into  both nostrils 2 (two) times daily as needed for allergies or rhinitis.      ibuprofen (ADVIL) 800 MG tablet Take 800 mg by mouth 2 (two) times daily as needed for headache or moderate pain.     Krill Oil 1000 MG CAPS      levETIRAcetam (KEPPRA) 500 MG tablet Take 1 tablet (500 mg total) by mouth 2 (two) times daily. 60 tablet 2   lisinopril (PRINIVIL,ZESTRIL) 40 MG tablet Take 40 mg by mouth every morning.     Multiple Vitamin (MULTIVITAMIN) tablet Take 1 tablet by mouth daily.     omeprazole (PRILOSEC) 40 MG capsule Take 40 mg by mouth daily.     Polyvinyl Alcohol-Povidone PF (REFRESH) 1.4-0.6 % SOLN Place 1 drop into both eyes daily as needed (dry eyes).     tadalafil (CIALIS) 20 MG tablet Take 20 mg by mouth once as needed (very high blood pressure with fluctuations).     tamsulosin (FLOMAX) 0.4 MG CAPS capsule Take 0.4 mg by mouth in the morning and at bedtime.     HYDROcodone-acetaminophen (NORCO/VICODIN) 5-325 MG tablet Take 1 tablet by mouth every 8 (eight) hours as needed for pain. (Patient not taking: Reported on 11/15/2021)      Prior to Admission medications   Medication Sig Start Date End Date Taking? Authorizing Provider  aspirin EC 81 MG tablet Take 81 mg by mouth daily. Swallow whole.   Yes [provider]  Azelastine HCl 137 MCG/SPRAY SOLN    Yes [provider]  chlorthalidone (HYGROTON) 25 MG tablet Take 25 mg by mouth every morning.   Yes [provider]  ezetimibe (ZETIA) 10 MG tablet Take 1 tablet (10 mg total) by mouth daily. 09/24/21  Yes Shafer, Marcelino Scot, NP  fluticasone (FLONASE) 50 MCG/ACT nasal spray Place 2 sprays into both nostrils 2 (two) times daily as needed for allergies or rhinitis.    Yes [provider]  ibuprofen (ADVIL) 800 MG tablet Take 800 mg by mouth 2 (two) times daily as needed for headache or moderate pain. 04/10/21  Yes [provider]  Astrid Drafts 1000 MG CAPS    Yes [provider]  levETIRAcetam  (KEPPRA) 500 MG tablet Take 1 tablet (500 mg total) by mouth 2 (two) times daily. 10/03/21  Yes Pokhrel, Laxman, MD  lisinopril (PRINIVIL,ZESTRIL) 40 MG tablet Take 40 mg by mouth every morning.   Yes [provider]  Multiple Vitamin (MULTIVITAMIN) tablet Take 1 tablet by mouth daily.   Yes [provider]  omeprazole (PRILOSEC) 40 MG capsule Take 40 mg by mouth daily.   Yes [provider]  Polyvinyl Alcohol-Povidone PF (REFRESH) 1.4-0.6 % SOLN Place 1 drop into both eyes daily as needed (dry eyes).   Yes [provider]  tadalafil (CIALIS) 20 MG tablet Take 20 mg by mouth once as needed (very high blood pressure with fluctuations).   Yes [provider]  tamsulosin (FLOMAX) 0.4 MG CAPS capsule Take  0.4 mg by mouth in the morning and at bedtime.   Yes [provider]  HYDROcodone-acetaminophen (NORCO/VICODIN) 5-325 MG tablet Take 1 tablet by mouth every 8 (eight) hours as needed for pain. Patient not taking: Reported on 11/15/2021 09/20/21   [provider]    Blood pressure (!) 113/59, pulse 88, temperature 98.4 F (36.9 C), temperature source Oral, resp. rate 20, height '5\' 8"'$  (1.727 m), weight 79.9 kg, SpO2 96 %. Physical Exam: General: pleasant, WD/WN male who is laying in bed in NAD HEENT: head is normocephalic, atraumatic.  Sclera are noninjected.  Pupils equal and round.  Ears and nose without any masses or lesions.  Mouth is pink and moist. Dentition fair Heart: regular, rate, and rhythm.  Normal s1,s2. No obvious murmurs, gallops, or rubs noted.  Palpable pedal pulses bilaterally  Lungs: CTAB, no wheezes, rhonchi, or rales noted.  Respiratory effort nonlabored Abd: well healed midline incision, soft, ND, +BS, no masses or organomegaly. Indurated area noted to the right of midline with erythema, tenderness, no active drainage MS: no BUE/BLE edema, calves soft and nontender Skin: warm and dry with no masses, lesions, or  rashes Psych: A&Ox4 with an appropriate affect Neuro: cranial nerves grossly intact, equal strength in BUE/BLE bilaterally, normal speech, thought process intact  Results for orders placed or performed during the hospital encounter of 11/15/21 (from the past 48 hour(s))  Lipase, blood     Status: None   Collection Time: 11/15/21  9:50 AM  Result Value Ref Range   Lipase 12 11 - 51 U/L    Comment: Performed at [facility], [address], [place], [place] [address]  Comprehensive metabolic panel     Status: Abnormal   Collection Time: 11/15/21  9:50 AM  Result Value Ref Range   Sodium 139 135 - 145 mmol/L   Potassium 4.2 3.5 - 5.1 mmol/L   Chloride 104 98 - 111 mmol/L   CO2 23 22 - 32 mmol/L   Glucose, Bld 164 (H) 70 - 99 mg/dL    Comment: Glucose reference range applies only to samples taken after fasting for at least 8 hours.   BUN 26 (H) 8 - 23 mg/dL   Creatinine, Ser 1.38 (H) 0.61 - 1.24 mg/dL   Calcium 10.3 8.9 - 10.3 mg/dL   Total Protein 7.8 6.5 - 8.1 g/dL   Albumin 4.4 3.5 - 5.0 g/dL   AST 11 (L) 15 - 41 U/L   ALT 12 0 - 44 U/L   Alkaline Phosphatase 98 38 - 126 U/L   Total Bilirubin 0.6 0.3 - 1.2 mg/dL   GFR, Estimated 53 (L) >60 mL/min    Comment: (NOTE) Calculated using the CKD-EPI Creatinine Equation (2021)    Anion gap 12 5 - 15    Comment: Performed at [facility], [address], [place], [place] [address]  CBC     Status: Abnormal   Collection Time: 11/15/21  9:50 AM  Result Value Ref Range   WBC 16.7 (H) 4.0 - 10.5 K/uL   RBC 4.31 4.22 - 5.81 MIL/uL   Hemoglobin 12.0 (L) 13.0 - 17.0 g/dL   HCT 37.0 (L) 39.0 - 52.0 %   MCV 85.8 80.0 - 100.0 fL   MCH 27.8 26.0 - 34.0 pg   MCHC 32.4 30.0 - 36.0 g/dL   RDW 13.7 11.5 - 15.5 %   Platelets 281 150 - 400 K/uL   nRBC 0.0 0.0 - 0.2 %  Comment: Performed at [facility], [address] [place], [place] [address]  Resp Panel by RT-PCR (Flu A&B, Covid)  Nasopharyngeal Swab     Status: None   Collection Time: 11/15/21 10:50 AM   Specimen: Nasopharyngeal Swab; Nasopharyngeal(NP) swabs in vial transport medium  Result Value Ref Range   SARS Coronavirus 2 by RT PCR NEGATIVE NEGATIVE    Comment: (NOTE) SARS-CoV-2 target nucleic acids are NOT DETECTED.  The SARS-CoV-2 RNA is generally detectable in upper respiratory specimens during the acute phase of infection. The lowest concentration of SARS-CoV-2 viral copies this assay can detect is 138 copies/mL. A negative result does not preclude SARS-Cov-2 infection and should not be used as the sole basis for treatment or other patient management decisions. A negative result may occur with  improper specimen collection/handling, submission of specimen other than nasopharyngeal swab, presence of viral mutation(s) within the areas targeted by this assay, and inadequate number of viral copies(<138 copies/mL). A negative result must be combined with clinical observations, patient history, and epidemiological information. The expected result is Negative.  Fact Sheet for Patients:  EntrepreneurPulse.com.au  Fact Sheet for Healthcare Providers:  IncredibleEmployment.be  This test is no t yet approved or cleared by the [place] FDA and  has been authorized for detection and/or diagnosis of SARS-CoV-2 by FDA under an Emergency Use Authorization (EUA). This EUA will remain  in effect (meaning this test can be used) for the duration of the COVID-19 declaration under Section 564(b)(1) of the Act, 21 U.S.C.section 360bbb-3(b)(1), unless the authorization is terminated  or revoked sooner.       Influenza A by PCR NEGATIVE NEGATIVE   Influenza B by PCR NEGATIVE NEGATIVE    Comment: (NOTE) The Xpert Xpress SARS-CoV-2/FLU/RSV plus assay is intended as an aid in the diagnosis of influenza from Nasopharyngeal swab specimens and should not be used as a sole basis for  treatment. Nasal washings and aspirates are unacceptable for Xpert Xpress SARS-CoV-2/FLU/RSV testing.  Fact Sheet for Patients: EntrepreneurPulse.com.au  Fact Sheet for Healthcare Providers: IncredibleEmployment.be  This test is not yet approved or cleared by the [place] FDA and has been authorized for detection and/or diagnosis of SARS-CoV-2 by FDA under an Emergency Use Authorization (EUA). This EUA will remain in effect (meaning this test can be used) for the duration of the COVID-19 declaration under Section 564(b)(1) of the Act, 21 U.S.C. section 360bbb-3(b)(1), unless the authorization is terminated or revoked.  Performed at [facility], [address] [place], [place] [address]   Urinalysis, Routine w reflex microscopic     Status: Abnormal   Collection Time: 11/15/21  2:10 PM  Result Value Ref Range   Color, Urine YELLOW YELLOW   APPearance CLEAR CLEAR   Specific Gravity, Urine 1.043 (H) 1.005 - 1.030   pH 6.5 5.0 - 8.0   Glucose, UA NEGATIVE NEGATIVE mg/dL   Hgb urine dipstick NEGATIVE NEGATIVE   Bilirubin Urine NEGATIVE NEGATIVE   Ketones, ur NEGATIVE NEGATIVE mg/dL   Protein, ur NEGATIVE NEGATIVE mg/dL   Nitrite NEGATIVE NEGATIVE   Leukocytes,Ua NEGATIVE NEGATIVE    Comment: Performed at [facility], [address], [place], [place] [address]  Blood culture (routine x 2)     Status: None (Preliminary result)   Collection Time: 11/15/21  2:23 PM   Specimen: BLOOD LEFT HAND  Result Value Ref Range   Specimen Description      BLOOD LEFT HAND Performed at [facility], [place], [place], [place] [address]    Special Requests      BOTTLES DRAWN AEROBIC AND ANAEROBIC Blood Culture adequate volume Performed at [facility], [address], [place], [place] [address]    Culture      NO GROWTH < 24 HOURS Performed at [facility], [place] [address] [place], [place] [address]    Report Status PENDING   Blood culture (routine x 2)     Status: None (Preliminary result)   Collection Time: 11/15/21  2:29 PM   Specimen: Right Antecubital; Blood  Result Value Ref Range   Specimen Description      RIGHT ANTECUBITAL Performed at [facility], [address] [place], [place] [address]    Special Requests      BOTTLES DRAWN AEROBIC AND ANAEROBIC Blood Culture adequate volume Performed at [facility], [address], [place], [place] [address]    Culture      NO GROWTH < 24 HOURS Performed at [facility], [place] [address] [place], [place] [address]    Report Status PENDING   CBC     Status: Abnormal   Collection Time: 11/16/21  3:30 AM  Result Value Ref Range   WBC 16.8 (H) 4.0 - 10.5 K/uL   RBC 3.85 (L) 4.22 - 5.81 MIL/uL   Hemoglobin 10.9 (L) 13.0 - 17.0 g/dL   HCT 34.4 (L) 39.0 - 52.0 %   MCV 89.4 80.0 - 100.0 fL   MCH 28.3 26.0 - 34.0 pg   MCHC 31.7 30.0 - 36.0 g/dL   RDW 13.9 11.5 - 15.5 %   Platelets 238 150 - 400 K/uL   nRBC 0.0 0.0 - 0.2 %    Comment: Performed at [facility], [place] [address], [place], [place] [address]  Basic metabolic panel     Status: Abnormal   Collection Time: 11/16/21  3:30 AM  Result Value Ref Range   Sodium 135 135 - 145 mmol/L   Potassium 3.6 3.5 - 5.1 mmol/L   Chloride 102 98 - 111 mmol/L   CO2 22 22 - 32 mmol/L   Glucose, Bld 133 (H) 70 - 99 mg/dL    Comment: Glucose reference range applies only to samples taken after fasting for at least 8 hours.   BUN 27 (H) 8 - 23 mg/dL   Creatinine, Ser 1.46 (H) 0.61 - 1.24 mg/dL   Calcium 9.1 8.9 - 10.3 mg/dL   GFR, Estimated 50 (L) >60 mL/min    Comment: (NOTE) Calculated using the CKD-EPI Creatinine Equation (2021)    Anion gap 11 5 - 15    Comment: Performed at [facility], [place] [address] [place],  [address]   CT ABDOMEN PELVIS W  CONTRAST  Result Date: 11/15/2021 CLINICAL DATA:  Right lower quadrant abdominal pain swelling for 2.5 days EXAM: CT ABDOMEN AND PELVIS WITH CONTRAST TECHNIQUE: Multidetector CT imaging of the abdomen and pelvis was performed using the standard protocol following bolus administration of intravenous contrast. RADIATION DOSE REDUCTION: This exam was performed according to the departmental dose-optimization program which includes automated exposure control, adjustment of the mA and/or kV according to patient size and/or use of iterative reconstruction technique. CONTRAST:  32m OMNIPAQUE IOHEXOL 300 MG/ML  SOLN COMPARISON:  07/18/2021 FINDINGS: Lower chest: Dense mitral valve calcification and mild aortic valve calcification. Descending thoracic aortic and right coronary artery atherosclerotic calcification. Upper normal heart size. Small inferior pericardial effusion. Small type 1  hiatal hernia. 6 by 4 by 3 mm (volume = 40 mm^3) right lower lobe nodule on image 10 series 4 appears unchanged. Hepatobiliary: Unremarkable Pancreas: Unremarkable Spleen: Unremarkable Adrenals/Urinary Tract: 8 by 7 mm hypodense lesion in the left mid kidney medially is technically too small to characterize although statistically probably a cyst. Adrenal glands unremarkable. Stomach/Bowel: Small type 1 hiatal hernia. Gastric wall thickening probably secondary to nondistention. There is formed stool in the distal colon. Anastomotic staple line in the sigmoid colon. Scattered air-fluid levels in nondilated small bowel. Vascular/Lymphatic: Atherosclerosis is present, including aortoiliac atherosclerotic disease. Substantial atheromatous plaque proximally in the celiac trunk and SMA. Reproductive: Linear bandlike scarring in the pubis extending above the penis at the site of the prior fluid collection. Central prostate gland calcifications. Other: No supplemental non-categorized findings. Musculoskeletal: Upper abdominal ventral hernia  containing adipose tissue, image 28 series 2. Abnormal and potential loculated fluid collection measuring about 3.0 by 2.8 by 2.3 cm (volume = 10 cm^3) in the right rectus abdominus muscle, image 38 series 6. Abnormal appearance of the left rectus abdominus muscle including a 4.1 by 0.8 by 2.3 cm (volume = 4 cm^3) low-density collection with enhancing margins, and some increase thickening/nodularity in the subcutaneous tissues overlying the left rectus abdominus for example on image 43 of series 2 which represents change from the prior exam. There is some subcutaneous edema overlying both of the rectus abdominus muscles in the regions of abnormal nodular thickening. Spondylosis and degenerative disc disease at all lumbar levels with foraminal impingement most notable bilaterally at L4-5 and to a lesser degree at L3-4. IMPRESSION: 1. Abnormal fluid collections in both rectus abdominus muscles with overlying subcutaneous edema. The appearance could represent abscesses or possibly hematomas. 2. Scarring at the site of prior drained abscess along the pubis. 3. Small upper abdominal ventral midline hernia containing adipose tissue. 4. Stable 40 cubic mm right lower lobe pulmonary nodule. No follow-up needed if patient is low-risk.This recommendation follows the consensus statement: Guidelines for Management of Incidental Pulmonary Nodules Detected on CT Images: From the Fleischner Society 2017; Radiology 2017; 284:228-243. 5. Other imaging findings of potential clinical significance: Dense mitral valve and mild aortic valve calcifications. Aortic Atherosclerosis (ICD10-I70.0). Systemic atherosclerosis including plaque in the proximal SMA and celiac trunk. Small inferior pericardial effusion. Small type 1 hiatal hernia. Scattered nonspecific air-levels in nondilated small bowel. Multilevel lower lumbar impingement. Electronically Signed   By: Van Clines M.D.   On: 11/15/2021 11:38   DG Chest Port 1  View  Result Date: 11/15/2021 CLINICAL DATA:  Abdominal pain EXAM: PORTABLE CHEST 1 VIEW COMPARISON:  None. FINDINGS: Transverse diameter of heart is slightly increased. Thoracic aorta is tortuous. Lung fields are clear of any infiltrates or pulmonary edema. There is no pleural effusion or pneumothorax. IMPRESSION: There are no signs of pulmonary edema or focal pulmonary consolidation. Electronically Signed   By: Elmer Picker M.D.   On: 11/15/2021 10:57    Anti-infectives (From admission, onward)    Start     Dose/Rate Route Frequency Ordered Stop   11/16/21 1000  cefTRIAXone (ROCEPHIN) 1 g in sodium chloride 0.9 % 100 mL IVPB  Status:  Discontinued        1 g 200 mL/hr over 30 Minutes Intravenous Daily 11/15/21 2047 11/15/21 2100   11/16/21 1000  cefTRIAXone (ROCEPHIN) 2 g in sodium chloride 0.9 % 100 mL IVPB        2 g 200 mL/hr over 30 Minutes Intravenous Daily 11/15/21 2100  11/15/21 2200  vancomycin (VANCOREADY) IVPB 1250 mg/250 mL        1,250 mg 166.7 mL/hr over 90 Minutes Intravenous Every 24 hours 11/15/21 2100     11/15/21 1345  vancomycin (VANCOCIN) IVPB 1000 mg/200 mL premix        1,000 mg 200 mL/hr over 60 Minutes Intravenous  Once 11/15/21 1332 11/15/21 1812   11/15/21 1345  cefTRIAXone (ROCEPHIN) 1 g in sodium chloride 0.9 % 100 mL IVPB        1 g 200 mL/hr over 30 Minutes Intravenous  Once 11/15/21 1334 11/15/21 1812        Assessment/Plan Rectus sheath abscesses versus hematoma - Patient with complex abdominal surgical history listed above. CT scan shows abnormal fluid collections in both rectus abdominus muscles with overlying subcutaneous edema possibly representing abscesses or hematomas. Will ask IR to evaluate for possible aspiration vs drain placement. Continue broad spectrum antibiotics. Patient is concerned that he had a retained stitch requiring I&D and removal last year because it was causing an infection. He wonders if this could be the same thing.  It very well could be but given his recent stroke he would be higher risk of undergoing general anesthesia at this time, therefore we will recommend IR evaluation for intervention acutely. We will follow.  ID - rocephin/ vancomycin 3/7>> VTE - ok for chemical dvt ppx after procedure FEN - NPO Foley - none  Hx CVA 09/2021 Hx seizure HTN HLD GERD BPH Pulmonary nodule  I reviewed ED provider notes, hospitalist notes, last 24 h vitals and pain scores, last 24 h labs and trends, and last 24 h imaging results   Wellington [place], [facility] Surgery 11/16/2021, 9:39 AM Please see Amion for pager number during day hours 7:00am-4:30pm

## 2021-11-16 NOTE — Assessment & Plan Note (Addendum)
Presents with a hemoglobin of 12.  Hemoglobin has dropped to 10.  ?Hb remain stable  ?

## 2021-11-16 NOTE — Hospital Course (Addendum)
76 year old past medical history significant for CVA, hypertension, hyperlipidemia, bladder cancer, seizure disorder, GERD, BPH presents complaining of right lower quadrant abdominal pain for 2 to 3 days.  Started out as a little bump in the area which has been growing.  He denies vomiting.  Constipation.  He had a multiple abdominal surgeries including colostomy and reversal, exploratory laparotomy, several hernia repairs. ? ?He presented with leukocytosis, hemoglobin 12, creatinine 1.3, CT showed abnormal fluid collection in both rectus abdominis muscles with underlying subcutaneous edema,.  Suspicious for abscess or possible hematoma. ?Surgery was consulted, recommended IR consultation for aspiration and drainage. Underwent drain placement and aspiration by IR 3/8.  ID consulted to assist with the length of treatment.  Patient will require 4 weeks of antibiotics followed by long-term suppression antibiotics as mesh is in place (pending final surgical plan) ? ?Culture grew: MRSA, staph sensitive to Doxy and streptococcus intermedius. Discussed with Dr Linus Salmons, plan  to discharge on cefadroxil and Doxy for one month.  ?

## 2021-11-16 NOTE — Progress Notes (Signed)
?  Progress Note ? ? ?Patient: Richard Austin AST:[id number] DOB: [birth year] DOA: 11/15/2021     1 ?DOS: the patient was seen and examined on 11/16/2021 ?  ?Brief hospital course: ?76 year old past medical history significant for CVA, hypertension, hyperlipidemia, bladder cancer, seizure disorder, GERD, BPH presents complaining of right lower quadrant abdominal pain for 2 to 3 days.  Started out as a little bump in the area which has been growing.  He denies vomiting.  Constipation.  He had a multiple abdominal surgeries including colostomy and reversal, exploratory laparotomy, several hernia repairs. ? ?He presented with leukocytosis, hemoglobin 12, creatinine 1.3, CT showed abnormal fluid collection in both rectus abdominis muscles with underlying subcutaneous edema,.  Suspicious for abscess or possible hematoma. ?Surgery was consulted, recommended IR consultation for aspiration and drainage. ? ?Assessment and Plan: ?* Abdominal pain ?Rectus sheath abscesses versus hematoma ?CT showing abnormal fluid collections in both rectus abdominis muscles with overlying subcutaneous edema, appearance suspicious for abscesses or possibly hematoma.  ?-He presented with leukocytosis, hemoglobin has decreased to 10.9 from 12. ?-Continue vancomycin and ceftriaxone. ?-Surgery recommended IR consultation for aspiration and or drainage placed. ?-Follow blood cultures ?-IR planning aspiration today. ? ?Pulmonary nodule ?CT showing stable 40 cubic mm right lower lobe pulmonary nodule. ?-Outpatient follow-up ? ?AKI (acute kidney injury) ([place]) ?Likely prerenal from dehydration. CRbaseline 1.0-1.1. ?-Continue with IV fluids ?-Continue to hold chlorthalidone and lisinopril.  ?Cr increase to 1.4  ? ?Seizure disorder ([place]) ?-Continue Keppra ? ?HLD (hyperlipidemia) ?-Continue Zetia ? ?GERD (gastroesophageal reflux disease) ?-Continue Protonix ? ?BPH (benign prostatic hyperplasia) ?-Continue Flomax ? ?Normocytic anemia ?Presents with a  hemoglobin of 12.  Hemoglobin has dropped to 10.  Continue to monitor due to concern with hematoma ?Not on anticoagulation. ? ? ? ? ?  ? ?Subjective: he is alert, complain of pain, indurated area abdomen.  ? ?Physical Exam: ?Vitals:  ? 11/16/21 0457 11/16/21 0805 11/16/21 1133 11/16/21 1200  ?BP: (!) 119/58 (!) 113/59 119/68   ?Pulse: 89 88 79   ?Resp: '16 20 14   '$ ?Temp: 99.5 ?F (37.5 ?C) 98.4 ?F (36.9 ?C)  98.7 ?F (37.1 ?C)  ?TempSrc: Oral Oral  Oral  ?SpO2: 93% 96% 96%   ?Weight:      ?Height:      ? ?General; NAD ?Lung; CTA ?Abdomen; soft, nt, indurated, redness area lower quadrant.  ? ?Data Reviewed: ? ?Cbc, Bmet, reviewed.  ? ?Family Communication: Care discussed with wife who was at bedside.  ? ?Disposition: ?Status is: Inpatient ?Remains inpatient appropriate because: requiring IV antibiotics for possible abscess.  ? Planned Discharge Destination: Home ? ? ? ?Time spent: 45 minutes ? ?Author: ?Elmarie Shiley, MD ?11/16/2021 1:56 PM ? ?For on call review [email or web address].  ?

## 2021-11-16 NOTE — Assessment & Plan Note (Signed)
Continue Keppra.

## 2021-11-16 NOTE — Progress Notes (Signed)
Transition of Care (TOC) Screening Note ? ?Patient Details  ?Name: Richard Austin ?Date of Birth: 05-19-1946 ? ?Transition of Care (TOC) CM/SW Contact:    ?Sherie Don, LCSW ?Phone Number: ?11/16/2021, 10:05 AM ? ?Transition of Care Department [facility]) has reviewed patient and no TOC needs have been identified at this time. We will continue to monitor patient advancement through interdisciplinary progression rounds. If new patient transition needs arise, please place a TOC consult. ?

## 2021-11-16 NOTE — Assessment & Plan Note (Addendum)
-  Likely prerenal from dehydration. CRbaseline 1.0-1.1. ?-Continue with IV fluids ?-Continue to hold chlorthalidone ?-Creatinine down to 1.0. improved ?Counseling provided in regards ibuprofen intake.  ?

## 2021-11-16 NOTE — Procedures (Signed)
Interventional Radiology Procedure: ? ? ?Indications: Right anterior abdominal wall abscess with skin redness.  Left abdominal wall collection.  ? ?Procedure: 1) US guided aspiration and drain placement into right abdominal wall abscess ?2) US guided aspiration of left abdominal wall collection ? ?Findings: 1) Pink purulent fluid removed from right abdominal collection and placed 10 Fr drain.  Removed 7 ml of purulent fluid. ? ?2) Placed Yueh catheter in the abdominal collection but fluid appears to be very thick and could only aspirate 1 ml of purulent looking fluid.  This fluid appears to be adjacent to surgical mesh.  ? ?Complications: No immediate complications noted. ?    ?EBL: Minimal ? ?Plan: Send both fluid collections for culture.   ? ? ?Richard Austin R. Anselm Pancoast, MD  ?Pager: [phone] ? ? ? ?  ?

## 2021-11-16 NOTE — Assessment & Plan Note (Addendum)
Rectus sheath abscesses, adjacent to mesh ?CT showing abnormal fluid collections in both rectus abdominis muscles with overlying subcutaneous edema, appearance suspicious for abscesses or possibly hematoma.  ?-He presented with leukocytosis.  ?-Continue vancomycin and ceftriaxone. ?-Surgery recommended IR consultation for aspiration and or drainage placed. ?-blood cultures: no growth to date.  ?-underwent ultrasound guided aspiration and drain placement into abdominal wall abscess by IR 3/8.  Pink purulent fluid removed from right abdominal collection.  Culture growing: Abundant streptococcus intermedius and MRSA staph.  ?-ID consulted, recommend 4 weeks of antibiotics followed by chronic suppression depending on final surgical plans.  ?Surgery would like to avoid surgery intervention due to recent stroke (two month ago) ?Plan to discharge on Doxy and cefadroxil for one month. He will need to follow up with ID for further antibiotics recommendation, chronic suppression.  ?

## 2021-11-16 NOTE — Assessment & Plan Note (Signed)
CT showing stable 40 cubic mm right lower lobe pulmonary nodule. ?-Outpatient follow-up ?

## 2021-11-16 NOTE — Assessment & Plan Note (Signed)
-   Continue Flomax 

## 2021-11-16 NOTE — Assessment & Plan Note (Signed)
Continue Zetia. °

## 2021-11-17 ENCOUNTER — Other Ambulatory Visit: Payer: Self-pay | Admitting: *Deleted

## 2021-11-17 DIAGNOSIS — R109 Unspecified abdominal pain: Secondary | ICD-10-CM | POA: Diagnosis not present

## 2021-11-17 DIAGNOSIS — M6008 Infective myositis, other site: Secondary | ICD-10-CM | POA: Diagnosis not present

## 2021-11-17 LAB — BASIC METABOLIC PANEL
Anion gap: 7 (ref 5–15)
BUN: 29 mg/dL — ABNORMAL HIGH (ref 8–23)
CO2: 24 mmol/L (ref 22–32)
Calcium: 9.2 mg/dL (ref 8.9–10.3)
Chloride: 106 mmol/L (ref 98–111)
Creatinine, Ser: 1.36 mg/dL — ABNORMAL HIGH (ref 0.61–1.24)
GFR, Estimated: 54 mL/min — ABNORMAL LOW (ref >60)
Glucose, Bld: 119 mg/dL — ABNORMAL HIGH (ref 70–99)
Potassium: 3.9 mmol/L (ref 3.5–5.1)
Sodium: 137 mmol/L (ref 135–145)

## 2021-11-17 LAB — CBC
HCT: 32.5 % — ABNORMAL LOW (ref 39.0–52.0)
Hemoglobin: 10.3 g/dL — ABNORMAL LOW (ref 13.0–17.0)
MCH: 28 pg (ref 26.0–34.0)
MCHC: 31.7 g/dL (ref 30.0–36.0)
MCV: 88.3 fL (ref 80.0–100.0)
Platelets: 227 10*3/uL (ref 150–400)
RBC: 3.68 MIL/uL — ABNORMAL LOW (ref 4.22–5.81)
RDW: 13.6 % (ref 11.5–15.5)
WBC: 12.6 10*3/uL — ABNORMAL HIGH (ref 4.0–10.5)
nRBC: 0 % (ref 0.0–0.2)

## 2021-11-17 MED ORDER — TAMSULOSIN HCL 0.4 MG PO CAPS
0.4000 mg | ORAL_CAPSULE | Freq: Every day | ORAL | Status: DC
Start: 2021-11-17 — End: 2021-11-19
  Administered 2021-11-17 – 2021-11-18 (×2): 0.4 mg via ORAL

## 2021-11-17 NOTE — Patient Outreach (Signed)
[facility]) Care Management ? ?11/17/2021 ? ?Richard Austin ?December 17, 1945 ?[id number] ? ? ?[place] coordination- collaboration with [facility] hospital liaison ? ?Notified of patient admission on 11/15/21 for abdominal pain by [facility] hospital liaison, B Tamala Julian ?Dx Rectus sheath abscesses, adjacent to mesh ?CT showing abnormal fluid collections in both rectus abdominis muscles with overlying subcutaneous edema, appearance suspicious for abscesses or possibly hematoma.  ?-He presented with leukocytosis.  ?-Continue vancomycin and ceftriaxone. ?-Surgery recommended IR consultation for aspiration and or drainage placed. ?-blood cultures: no growth to date.  ?-underwent ultrasound guided aspiration and drain placement into abdominal wall abscess by IR 3/8. Pink purulent fluid removed from right abdominal collection. Culture growing: Moderate gram-positive cocci ?-ID consulted, recommend 4 weeks of antibiotics followed by chronic suppression depending on final surgical plans.  ?Surgery would like to avoid surgery intervention due to recent stroke (two month ago) ? ?Plan ?[facility] RN CM will follow up with patient within the next 30+business days- pending hospital discharge ? ?Latisha Lasch L. Lavina Hamman, RN, BSN, CCM ?[place] Management Care Coordinator ?Office number [phone] ? ?

## 2021-11-17 NOTE — Progress Notes (Signed)
Supervising Physician: Arne Cleveland  Patient Status:  [facility] - In-pt  Chief Complaint:  Abdominal wall abscess s/p drain placement  Subjective:  Feeling better today.  Pleasant mood, watching tv, believes he will get to go home this weekend. Reports no flushing of drain as of yet.  Allergies: Amlodipine, Amlodipine besylate, Nebivolol hcl, Statins, and Latex  Medications: Prior to Admission medications   Medication Sig Start Date End Date Taking? Authorizing Provider  aspirin EC 81 MG tablet Take 81 mg by mouth daily. Swallow whole.   Yes [provider]  Azelastine HCl 137 MCG/SPRAY SOLN    Yes [provider]  chlorthalidone (HYGROTON) 25 MG tablet Take 25 mg by mouth every morning.   Yes [provider]  ezetimibe (ZETIA) 10 MG tablet Take 1 tablet (10 mg total) by mouth daily. 09/24/21  Yes Shafer, Marcelino Scot, NP  fluticasone (FLONASE) 50 MCG/ACT nasal spray Place 2 sprays into both nostrils 2 (two) times daily as needed for allergies or rhinitis.    Yes [provider]  ibuprofen (ADVIL) 800 MG tablet Take 800 mg by mouth 2 (two) times daily as needed for headache or moderate pain. 04/10/21  Yes [provider]  Astrid Drafts 1000 MG CAPS    Yes [provider]  levETIRAcetam (KEPPRA) 500 MG tablet Take 1 tablet (500 mg total) by mouth 2 (two) times daily. 10/03/21  Yes Pokhrel, Laxman, MD  lisinopril (PRINIVIL,ZESTRIL) 40 MG tablet Take 40 mg by mouth every morning.   Yes [provider]  Multiple Vitamin (MULTIVITAMIN) tablet Take 1 tablet by mouth daily.   Yes [provider]  omeprazole (PRILOSEC) 40 MG capsule Take 40 mg by mouth daily.   Yes [provider]  Polyvinyl Alcohol-Povidone PF (REFRESH) 1.4-0.6 % SOLN Place 1 drop into both eyes daily as needed (dry eyes).   Yes [provider]  tadalafil (CIALIS) 20 MG tablet Take 20 mg by mouth once as needed (very high blood pressure with  fluctuations).   Yes [provider]  tamsulosin (FLOMAX) 0.4 MG CAPS capsule Take 0.4 mg by mouth daily. Takes at 2100   Yes [provider]  HYDROcodone-acetaminophen (NORCO/VICODIN) 5-325 MG tablet Take 1 tablet by mouth every 8 (eight) hours as needed for pain. Patient not taking: Reported on 11/15/2021 09/20/21   [provider]     Vital Signs: BP 129/71 (BP Location: Right Arm)    Pulse 74    Temp 97.9 F (36.6 C) (Oral)    Resp 18    Ht '5\' 8"'$  (1.727 m)    Wt 176 lb 3.2 oz (79.9 kg)    SpO2 98%    BMI 26.79 kg/m   Physical Exam Constitutional:      General: He is not in acute distress.    Appearance: He is not ill-appearing.  HENT:     Head: Normocephalic and atraumatic.  Cardiovascular:     Rate and Rhythm: Normal rate.  Pulmonary:     Effort: Pulmonary effort is normal. No respiratory distress.  Abdominal:     Comments: Firm and tender around site of drain catheter insertion.  Skin:    General: Skin is warm and dry.  Neurological:     General: No focal deficit present.     Mental Status: He is alert and oriented to person, place, and time.  Psychiatric:        Mood and Affect: Mood normal.  Behavior: Behavior normal.   Drain Location: RLQ Size: Fr size: 10 Fr Date of placement: 11/16/21 Currently to: Drain collection device: suction bulb 24 hour output:  Output by Drain (mL) 11/15/21 0701 - 11/15/21 1900 11/15/21 1901 - 11/16/21 0700 11/16/21 0701 - 11/16/21 1900 11/16/21 1901 - 11/17/21 0700 11/17/21 0701 - 11/17/21 1511  Closed System Drain Right RLQ Bulb (JP)    10      Current examination: Flushes/aspirates easily.  Insertion site unremarkable. Suture and stat lock in place. Dressed appropriately.     Imaging: [place] Abscess Drain  Result Date: 11/16/2021 INDICATION: 76 year old with pain and redness in the right anterior abdominal wall and concern for infection involving the rectus sheath. There is also a complex fluid  collection in the left anterior abdominal wall. EXAM: 1. Ultrasound-guided aspiration and drain placement in the right abdominal wall abscess 2. Ultrasound-guided aspiration of left anterior abdominal wall fluid collection MEDICATIONS: Moderate sedation ANESTHESIA/SEDATION: Moderate (conscious) sedation was employed during this procedure. A total of Versed 3.'0mg'$  and fentanyl 100 mcg was administered intravenously at the order of the provider performing the procedure. Total intra-service moderate sedation time: 37 minutes. Patient's level of consciousness and vital signs were monitored continuously by radiology nurse throughout the procedure under the supervision of the provider performing the procedure. COMPLICATIONS: None immediate. PROCEDURE: Informed written consent was obtained from the patient after a thorough discussion of the procedural risks, benefits and alternatives. All questions were addressed. A timeout was performed prior to the initiation of the procedure. Anterior abdominal wall was evaluated with ultrasound. The right side of the abdomen was prepped with chlorhexidine and sterile field was created. Maximal barrier sterile technique was utilized including caps, mask, sterile gowns, sterile gloves, sterile drape, hand hygiene and skin antiseptic. Skin was anesthetized with 1% lidocaine. Small incision was made. Using ultrasound guidance, Yueh catheter was directed into the collection and pink purulent fluid was aspirated. Superstiff Amplatz wire was advanced into the collection and the tract was dilated to accommodate a 10 [place] multipurpose drain. Approximately 7 mL of purulent fluid was removed. Catheter was sutured to skin and attached to suction bulb. Attention was directed to the left anterior abdominal fluid. A new sterile field was created. Left anterior abdomen was prepped and draped in sterile fashion. Skin was anesthetized with 1% lidocaine. Using ultrasound guidance, a spinal needle was  directed into the collection but very little fluid could be aspirated. Therefore, a Yueh catheter was directed into collection using ultrasound guidance and approximately 1 mL purulent looking fluid was removed. Needle was repositioned but no additional fluid could be aspirated. Bandages placed. FINDINGS: Complex fluid collection along the right anterior abdominal wall and rectus sheath. This collection measured 5.0 x 1.8 x 3.2 cm. Purulent fluid was removed from this collection and a 10 French drain was placed. This collection was decompressed at the end of the procedure. Left anterior abdominal collection is complex and there appears to be surgical mesh material adjacent to this complex fluid. Despite having a Yueh catheter well positioned in the collection, only a small amount of thick fluid could be aspirated. IMPRESSION: 1. Ultrasound-guided placement of a drainage catheter in the small abscess collection in the right anterior abdominal wall involving the rectus sheath. 7 mL of purulent fluid was removed and fluid was sent for culture. 2. Complex fluid collection along the left anterior abdominal wall which appears to be a associated with surgical mesh material. Only 1 mL of thick fluid could be aspirated  and the collection was NOT decompressed at the end of the procedure. Electronically Signed   By: Markus Daft M.D.   On: 11/16/2021 17:42   [place] Abscess Drain  Result Date: 11/16/2021 INDICATION: 76 year old with pain and redness in the right anterior abdominal wall and concern for infection involving the rectus sheath. There is also a complex fluid collection in the left anterior abdominal wall. EXAM: 1. Ultrasound-guided aspiration and drain placement in the right abdominal wall abscess 2. Ultrasound-guided aspiration of left anterior abdominal wall fluid collection MEDICATIONS: Moderate sedation ANESTHESIA/SEDATION: Moderate (conscious) sedation was employed during this procedure. A total of Versed 3.'0mg'$  and  fentanyl 100 mcg was administered intravenously at the order of the provider performing the procedure. Total intra-service moderate sedation time: 37 minutes. Patient's level of consciousness and vital signs were monitored continuously by radiology nurse throughout the procedure under the supervision of the provider performing the procedure. COMPLICATIONS: None immediate. PROCEDURE: Informed written consent was obtained from the patient after a thorough discussion of the procedural risks, benefits and alternatives. All questions were addressed. A timeout was performed prior to the initiation of the procedure. Anterior abdominal wall was evaluated with ultrasound. The right side of the abdomen was prepped with chlorhexidine and sterile field was created. Maximal barrier sterile technique was utilized including caps, mask, sterile gowns, sterile gloves, sterile drape, hand hygiene and skin antiseptic. Skin was anesthetized with 1% lidocaine. Small incision was made. Using ultrasound guidance, Yueh catheter was directed into the collection and pink purulent fluid was aspirated. Superstiff Amplatz wire was advanced into the collection and the tract was dilated to accommodate a 10 [place] multipurpose drain. Approximately 7 mL of purulent fluid was removed. Catheter was sutured to skin and attached to suction bulb. Attention was directed to the left anterior abdominal fluid. A new sterile field was created. Left anterior abdomen was prepped and draped in sterile fashion. Skin was anesthetized with 1% lidocaine. Using ultrasound guidance, a spinal needle was directed into the collection but very little fluid could be aspirated. Therefore, a Yueh catheter was directed into collection using ultrasound guidance and approximately 1 mL purulent looking fluid was removed. Needle was repositioned but no additional fluid could be aspirated. Bandages placed. FINDINGS: Complex fluid collection along the right anterior abdominal wall  and rectus sheath. This collection measured 5.0 x 1.8 x 3.2 cm. Purulent fluid was removed from this collection and a 10 French drain was placed. This collection was decompressed at the end of the procedure. Left anterior abdominal collection is complex and there appears to be surgical mesh material adjacent to this complex fluid. Despite having a Yueh catheter well positioned in the collection, only a small amount of thick fluid could be aspirated. IMPRESSION: 1. Ultrasound-guided placement of a drainage catheter in the small abscess collection in the right anterior abdominal wall involving the rectus sheath. 7 mL of purulent fluid was removed and fluid was sent for culture. 2. Complex fluid collection along the left anterior abdominal wall which appears to be a associated with surgical mesh material. Only 1 mL of thick fluid could be aspirated and the collection was NOT decompressed at the end of the procedure. Electronically Signed   By: Markus Daft M.D.   On: 11/16/2021 17:42   CT ABDOMEN PELVIS W CONTRAST  Result Date: 11/15/2021 CLINICAL DATA:  Right lower quadrant abdominal pain swelling for 2.5 days EXAM: CT ABDOMEN AND PELVIS WITH CONTRAST TECHNIQUE: Multidetector CT imaging of the abdomen and pelvis was performed  using the standard protocol following bolus administration of intravenous contrast. RADIATION DOSE REDUCTION: This exam was performed according to the departmental dose-optimization program which includes automated exposure control, adjustment of the mA and/or kV according to patient size and/or use of iterative reconstruction technique. CONTRAST:  26m OMNIPAQUE IOHEXOL 300 MG/ML  SOLN COMPARISON:  07/18/2021 FINDINGS: Lower chest: Dense mitral valve calcification and mild aortic valve calcification. Descending thoracic aortic and right coronary artery atherosclerotic calcification. Upper normal heart size. Small inferior pericardial effusion. Small type 1 hiatal hernia. 6 by 4 by 3 mm (volume  = 40 mm^3) right lower lobe nodule on image 10 series 4 appears unchanged. Hepatobiliary: Unremarkable Pancreas: Unremarkable Spleen: Unremarkable Adrenals/Urinary Tract: 8 by 7 mm hypodense lesion in the left mid kidney medially is technically too small to characterize although statistically probably a cyst. Adrenal glands unremarkable. Stomach/Bowel: Small type 1 hiatal hernia. Gastric wall thickening probably secondary to nondistention. There is formed stool in the distal colon. Anastomotic staple line in the sigmoid colon. Scattered air-fluid levels in nondilated small bowel. Vascular/Lymphatic: Atherosclerosis is present, including aortoiliac atherosclerotic disease. Substantial atheromatous plaque proximally in the celiac trunk and SMA. Reproductive: Linear bandlike scarring in the pubis extending above the penis at the site of the prior fluid collection. Central prostate gland calcifications. Other: No supplemental non-categorized findings. Musculoskeletal: Upper abdominal ventral hernia containing adipose tissue, image 28 series 2. Abnormal and potential loculated fluid collection measuring about 3.0 by 2.8 by 2.3 cm (volume = 10 cm^3) in the right rectus abdominus muscle, image 38 series 6. Abnormal appearance of the left rectus abdominus muscle including a 4.1 by 0.8 by 2.3 cm (volume = 4 cm^3) low-density collection with enhancing margins, and some increase thickening/nodularity in the subcutaneous tissues overlying the left rectus abdominus for example on image 43 of series 2 which represents change from the prior exam. There is some subcutaneous edema overlying both of the rectus abdominus muscles in the regions of abnormal nodular thickening. Spondylosis and degenerative disc disease at all lumbar levels with foraminal impingement most notable bilaterally at L4-5 and to a lesser degree at L3-4. IMPRESSION: 1. Abnormal fluid collections in both rectus abdominus muscles with overlying subcutaneous edema.  The appearance could represent abscesses or possibly hematomas. 2. Scarring at the site of prior drained abscess along the pubis. 3. Small upper abdominal ventral midline hernia containing adipose tissue. 4. Stable 40 cubic mm right lower lobe pulmonary nodule. No follow-up needed if patient is low-risk.This recommendation follows the consensus statement: Guidelines for Management of Incidental Pulmonary Nodules Detected on CT Images: From the Fleischner Society 2017; Radiology 2017; 284:228-243. 5. Other imaging findings of potential clinical significance: Dense mitral valve and mild aortic valve calcifications. Aortic Atherosclerosis (ICD10-I70.0). Systemic atherosclerosis including plaque in the proximal SMA and celiac trunk. Small inferior pericardial effusion. Small type 1 hiatal hernia. Scattered nonspecific air-levels in nondilated small bowel. Multilevel lower lumbar impingement. Electronically Signed   By: WVan ClinesM.D.   On: 11/15/2021 11:38   DG Chest Port 1 View  Result Date: 11/15/2021 CLINICAL DATA:  Abdominal pain EXAM: PORTABLE CHEST 1 VIEW COMPARISON:  None. FINDINGS: Transverse diameter of heart is slightly increased. Thoracic aorta is tortuous. Lung fields are clear of any infiltrates or pulmonary edema. There is no pleural effusion or pneumothorax. IMPRESSION: There are no signs of pulmonary edema or focal pulmonary consolidation. Electronically Signed   By: PElmer PickerM.D.   On: 11/15/2021 10:57    Labs:  CBC: Recent Labs  10/03/21 0523 11/15/21 0950 11/16/21 0330 11/17/21 0319  WBC 9.8 16.7* 16.8* 12.6*  HGB 11.8* 12.0* 10.9* 10.3*  HCT 36.0* 37.0* 34.4* 32.5*  PLT 285 281 238 227    COAGS: Recent Labs    09/21/21 1924 10/02/21 1740  INR 0.9 0.9  APTT 29 29    BMP: Recent Labs    10/03/21 0523 11/15/21 0950 11/16/21 0330 11/17/21 0319  NA 140 139 135 137  K 3.9 4.2 3.6 3.9  CL 108 104 102 106  CO2 '23 23 22 24  '$ GLUCOSE 127* 164* 133*  119*  BUN 22 26* 27* 29*  CALCIUM 9.3 10.3 9.1 9.2  CREATININE 1.12 1.38* 1.46* 1.36*  GFRNONAA >60 53* 50* 54*    LIVER FUNCTION TESTS: Recent Labs    07/18/21 1221 09/21/21 1924 10/02/21 1740 11/15/21 0950  BILITOT 0.7 0.4 0.4 0.6  AST 14* 22 18 11*  ALT '15 24 18 12  '$ ALKPHOS 73 87 96 98  PROT 7.1 6.5 7.1 7.8  ALBUMIN 4.0 3.5 3.8 4.4    Assessment and Plan:  Abdominal wall abscess --1 day s/p drainage --decreased in WBC overnight --clinically improving  Continue TID flushes with 3-5 cc NS. Record output Q shift. Dressing changes QD or PRN if soiled.   Call IR APP or on call IR MD if difficulty flushing or sudden change in drain output.  Repeat imaging/possible drain injection once output < 10 mL/QD (excluding flush material.)  Discharge planning: Please contact IR APP or on call IR MD prior to patient d/c to ensure appropriate follow up plans are in place. Typically patient will follow up with IR clinic 10-14 days post d/c for repeat imaging/possible drain injection. IR scheduler will contact patient with date/time of appointment. Patient will need to flush drain QD with 5 cc NS, record output QD, dressing changes every 2-3 days or earlier if soiled.   IR will continue to follow - please call with questions or concerns.  Electronically Signed: Pasty Spillers, PA 11/17/2021, 3:08 PM   I spent a total of 15 Minutes at the the patient's bedside AND on the patient's hospital floor or unit, greater than 50% of which was counseling/coordinating care for abdominal wall abscess.

## 2021-11-17 NOTE — Progress Notes (Signed)
?  Progress Note ? ? ?Patient: Richard Austin HYI:[id number] DOB: Dec 18, 1945 DOA: 11/15/2021     2 ?DOS: the patient was seen and examined on 11/17/2021 ?  ?Brief hospital course: ?76 year old past medical history significant for CVA, hypertension, hyperlipidemia, bladder cancer, seizure disorder, GERD, BPH presents complaining of right lower quadrant abdominal pain for 2 to 3 days.  Started out as a little bump in the area which has been growing.  He denies vomiting.  Constipation.  He had a multiple abdominal surgeries including colostomy and reversal, exploratory laparotomy, several hernia repairs. ? ?He presented with leukocytosis, hemoglobin 12, creatinine 1.3, CT showed abnormal fluid collection in both rectus abdominis muscles with underlying subcutaneous edema,.  Suspicious for abscess or possible hematoma. ?Surgery was consulted, recommended IR consultation for aspiration and drainage. Underwent drain placement and aspiration by IR 3/8.  ID consulted to assist with the length of treatment.  Patient will require 4 weeks of antibiotics followed by long-term suppression antibiotics as mesh is in place (pending final surgical plan) ? ?Assessment and Plan: ?* Abdominal pain ?Rectus sheath abscesses, adjacent to mesh ?CT showing abnormal fluid collections in both rectus abdominis muscles with overlying subcutaneous edema, appearance suspicious for abscesses or possibly hematoma.  ?-He presented with leukocytosis.  ?-Continue vancomycin and ceftriaxone. ?-Surgery recommended IR consultation for aspiration and or drainage placed. ?-blood cultures: no growth to date.  ?-underwent ultrasound guided aspiration and drain placement into abdominal wall abscess by IR 3/8.  Pink purulent fluid removed from right abdominal collection.  Culture growing: Moderate gram-positive cocci ?-ID consulted, recommend 4 weeks of antibiotics followed by chronic suppression depending on final surgical plans.  ?Surgery would like to avoid  surgery intervention due to recent stroke (two month ago) ? ?Pulmonary nodule ?CT showing stable 40 cubic mm right lower lobe pulmonary nodule. ?-Outpatient follow-up ? ?AKI (acute kidney injury) ([place]) ?Likely prerenal from dehydration. CRbaseline 1.0-1.1. ?-Continue with IV fluids ?-Continue to hold chlorthalidone and lisinopril.  ?-Creatinine down to 1.3 improve ? ?Seizure disorder ([place]) ?-Continue Keppra ? ?HLD (hyperlipidemia) ?-Continue Zetia ? ?GERD (gastroesophageal reflux disease) ?-Continue Protonix ? ?BPH (benign prostatic hyperplasia) ?-Continue Flomax ? ?Normocytic anemia ?Presents with a hemoglobin of 12.  Hemoglobin has dropped to 10.  Continue to monitor due to concern with hematoma ?Not on anticoagulation. ? ? ? ? ?  ? ?Subjective: he is doing ok, report pain abdomen.  ? ? ?Physical Exam: ?Vitals:  ? 11/16/21 1845 11/16/21 2101 11/17/21 0535 11/17/21 1356  ?BP: 139/65 (!) 121/59 (!) 124/59 129/71  ?Pulse: 93 84 78 74  ?Resp: '16 16 16 18  '$ ?Temp: 98.6 ?F (37 ?C) 98.9 ?F (37.2 ?C) 98 ?F (36.7 ?C) 97.9 ?F (36.6 ?C)  ?TempSrc: Oral Oral Oral Oral  ?SpO2: 99% 98% 97% 98%  ?Weight:      ?Height:      ? ?General; NAD ?Lung; CTA ?Abdomen; soft, tender, drain in place right lower quadrant.  ? ?Data Reviewed: ? ?Cbc, bmet, abscess fluid culture.  ? ?Family Communication: care discussed with patient.  ? ?Disposition: ?Status is: Inpatient ?Remains inpatient appropriate because: required abscess drainage and IV antibiotics. Awaiting culture results.  ? Planned Discharge Destination: Home ? ? ? ?Time spent: 45 minutes ? ?Author: ?Elmarie Shiley, MD ?11/17/2021 3:16 PM ? ?For on call review [email or web address].  ?

## 2021-11-17 NOTE — Consult Note (Signed)
?   ? ? ? ? ?[facility]   ? ?Date of Admission:  11/15/2021   Total days of inpatient antibiotics 2 ? ?      ?Reason for Consult: Rectus muscle abscess    ?Principal Problem: ?  Abdominal pain ?Active Problems: ?  AKI (acute kidney injury) (Richard Austin) ?  BPH (benign prostatic hyperplasia) ?  GERD (gastroesophageal reflux disease) ?  HLD (hyperlipidemia) ?  Seizure disorder (Richard Austin) ?  Pulmonary nodule ?  Normocytic anemia ? ? ?Assessment: ?41 YM with diverticulitis c/b multiple abdominal surgeries including colostomy(reversed) and complicated by hernia requiring mesh repair which was infected requiring multiple procedure for removal, primary repair of ventral hernia in 2014 admitted for rectus muscle infection.  ? ?#Left rectus muscle abscess adjacent to mesh SP Richard Austin guided aspiration ?#Right rectus muscle abscess SP drain placement on 3/8 ?-Admission in November, 2022 for groin abscess SP I&D in OR with Cx+ MRSA and Strep intermedius discharged on cefdinir x5 days. Pt states he took about 10 days of antibiotics, so unclear if MRSA was treated.  ?-CT showed R and LE rectus muscle abscess.IR consulted pt underwent Richard Austin guided aspiration.  Drain placed in right rectus muscle. Per IR left rectus collection is adjacent to mesh. General surgery consulted and pt will require reconstructive surgery for mesh and foreign body removable, not advisable int he setting of recent CVA. ?Recommendations:  ?-Continue Vancomycin and ceftriaxone ?-Follow aspirate Cx(gram stain + GPC) ?-Anticipate about 4 weeks of antibiotics, followed by long term suppressive antibiotics as mesh in place(pending final surgical plan) ?Microbiology:   ?Antibiotics: ?Ceftriaxone 3/7-p ?Vancomycin 3/7-p ? ?Cultures: ?Blood ?3/7 NG ?Other ?3/8 OR Cx pending, GS GPC ? ?HPI: Richard Austin is a 76 y.o. male with CVA, HTN, HLD, bladder Ca, diverticulitis c/b multiple abdominal surgeries including colostomy(reversed) and complicated by  hernia requiring mesh repair which was infected requiring multiple procedure for removal, primary repair of ventral hernia in 2014 in [place], admission in November, 2022 for groin abscess SP I&D in OR with Cx+ MRSA and Strep intermedius discharged on cefdinir x5 days, seizure disorder, GERD, BPH admitted for rectus sheath abscess. Pt presented with abdominal that started 5 days prior to admission. On arrival, wbc 16k. Ct showed loculated fluid collection measuring 3x2.8x2.3 cm in R rectus muscle and abnormal left rectus muscle measuring 4.1x0.8x2.3cm. Underwent Richard Austin guided aspiration (IR) of L rectus abscess and drain placed in right rectus abscess. Per IR note surgical mesh appears to be adjacent to left anterior abdominal fluid collection-only small amount of thick fluid aspirated. ID engaged as no current plans for abdominal mesh removal in the setting of a recent stroke.  ?  ? ? ?Review of Systems: ?Review of Systems  ?All other systems reviewed and are negative. ? ?Past Medical History:  ?Diagnosis Date  ? Arthritis   ? Bladder tumor   ? Diverticulitis   ? Diverticulosis   ? Hypertension   ? 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 2019  ? Pre-diabetes   ? ? ?Social History  ? ?Tobacco Use  ? Smoking status: Former  ?  Packs/day: 1.50  ?  Years: 30.00  ?  Pack years: 45.00  ?  Types: Cigarettes  ? Smokeless tobacco: Never  ? Tobacco comments:  ?  QUIT 15 YRS AGO  ?Vaping Use  ? Vaping Use: Never used  ?Substance Use Topics  ? Alcohol use: Yes  ?  Comment: SELDOM; several shots of bourbon 07/17/2021  ?  Drug use: Not Currently  ? ? ?Family History  ?Problem Relation Age of Onset  ? Peptic Ulcer Mother   ? Cirrhosis Mother   ? Alcohol abuse Mother   ? Bladder Cancer Father   ? Throat cancer Maternal Grandfather   ? ?Scheduled Meds: ? ezetimibe  10 mg Oral Daily  ? levETIRAcetam  500 mg Oral BID  ? pantoprazole  40 mg Oral Daily  ? sodium chloride flush  5 mL Intracatheter Q8H  ? tamsulosin  0.4 mg Oral Daily   ? ?Continuous Infusions: ? cefTRIAXone (ROCEPHIN)  IV 2 g (11/17/21 0959)  ? lactated ringers 100 mL/hr at 11/17/21 0649  ? vancomycin 1,250 mg (11/16/21 2243)  ? ?PRN Meds:.acetaminophen **OR** acetaminophen, morphine injection ?Allergies  ?Allergen Reactions  ? Amlodipine Other (See Comments) and Swelling  ?  Muscle tightness, fatigue   ? Amlodipine Besylate   ?  Other reaction(s): Unknown ?Other reaction(s): Unknown  ? Nebivolol Hcl Other (See Comments) and Swelling  ?  Muscle tightness and fatigue  ?Other reaction(s): Unknown ?Other reaction(s): Unknown  ? Statins Other (See Comments)  ?  Muscle tightness and fatigue  ?Other reaction(s): Unknown ?Other reaction(s): Myositis, Unknown  ? Latex Rash  ? ? ?OBJECTIVE: ?Blood pressure (!) 124/59, pulse 78, temperature 98 ?F (36.7 ?C), temperature source Oral, resp. rate 16, height '5\' 8"'$  (1.727 m), weight 79.9 kg, SpO2 97 %. ? ?Physical Exam ?Constitutional:   ?   General: He is not in acute distress. ?   Appearance: He is normal weight. He is not toxic-appearing.  ?HENT:  ?   Head: Normocephalic and atraumatic.  ?   Right Ear: External ear normal.  ?   Left Ear: External ear normal.  ?   Nose: No congestion or rhinorrhea.  ?   Mouth/Throat:  ?   Mouth: Mucous membranes are moist.  ?   Pharynx: Oropharynx is clear.  ?Eyes:  ?   Extraocular Movements: Extraocular movements intact.  ?   Conjunctiva/sclera: Conjunctivae normal.  ?   Pupils: Pupils are equal, round, and reactive to light.  ?Cardiovascular:  ?   Rate and Rhythm: Normal rate and regular rhythm.  ?   Heart sounds: No murmur heard. ?  No friction rub. No gallop.  ?Pulmonary:  ?   Effort: Pulmonary effort is normal.  ?   Breath sounds: Normal breath sounds.  ?Abdominal:  ?   General: Abdomen is flat. Bowel sounds are normal.  ?   Palpations: Abdomen is soft.  ?   Comments: Right abdominal wall drain  ?Musculoskeletal:     ?   General: No swelling. Normal range of motion.  ?   Cervical back: Normal range of  motion and neck supple.  ?Skin: ?   General: Skin is warm and dry.  ?Neurological:  ?   General: No focal deficit present.  ?   Mental Status: He is oriented to person, place, and time.  ?Psychiatric:     ?   Mood and Affect: Mood normal.  ? ? ?Lab Results ?Lab Results  ?Component Value Date  ? WBC 12.6 (H) 11/17/2021  ? HGB 10.3 (L) 11/17/2021  ? HCT 32.5 (L) 11/17/2021  ? MCV 88.3 11/17/2021  ? PLT 227 11/17/2021  ?  ?Lab Results  ?Component Value Date  ? CREATININE 1.36 (H) 11/17/2021  ? BUN 29 (H) 11/17/2021  ? NA 137 11/17/2021  ? K 3.9 11/17/2021  ? CL 106 11/17/2021  ? CO2 24 11/17/2021  ?  ?  Lab Results  ?Component Value Date  ? ALT 12 11/15/2021  ? AST 11 (L) 11/15/2021  ? ALKPHOS 98 11/15/2021  ? BILITOT 0.6 11/15/2021  ?  ? ? ? ?Laurice Record, MD ?[facility] ?[place] ?11/17/2021, 11:30 AM  ?

## 2021-11-17 NOTE — Progress Notes (Signed)
Assessment & Plan: Rectus sheath / abdominal wall abscess  Successful IR aspiration and drainage of abscess adjacent to mesh  IV Rocephin, Vancomycin  Await culture results  WBC improved  Will consult infectious disease regarding appropriate antibiotics and length of therapy.  This is a chronic issue with patient's last operative procedure in 2014.  Extirpation of mesh and foreign material would be a complex undertaking requiring some type of reconstruction.  Not advisable in setting of recent stroke.  Discussed with patient who understands and agrees with present management.  ID - rocephin/ vancomycin 3/7>> VTE - ok for chemical dvt ppx after procedure FEN - resume diet Foley - none   Hx CVA 09/2021 Hx seizure HTN HLD GERD BPH Pulmonary nodule        Armandina Gemma, MD       [facility]       Office: [phone]   Chief Complaint: Abdominal wall abscess  Subjective: Patient in bed, comfortable.  Objective: Vital signs in last 24 hours: Temp:  [98 F (36.7 C)-99.3 F (37.4 C)] 98 F (36.7 C) (03/09 0535) Pulse Rate:  [78-93] 78 (03/09 0535) Resp:  [12-18] 16 (03/09 0535) BP: (107-144)/(48-98) 124/59 (03/09 0535) SpO2:  [96 %-100 %] 97 % (03/09 0535) Last BM Date : 11/15/21  Intake/Output from previous day: 03/08 0701 - 03/09 0700 In: 360.4 [I.V.:10.4; IV Piggyback:350] Out: 10 [Drains:10] Intake/Output this shift: No intake/output data recorded.  Physical Exam: HEENT - sclerae clear, mucous membranes moist Abdomen - soft, wounds well healed; drain with thin purulent appearing fluid Ext - no edema, non-tender Neuro - alert & oriented, no focal deficits  Lab Results:  Recent Labs    11/16/21 0330 11/17/21 0319  WBC 16.8* 12.6*  HGB 10.9* 10.3*  HCT 34.4* 32.5*  PLT 238 227   BMET Recent Labs    11/16/21 0330 11/17/21 0319  NA 135 137  K 3.6 3.9  CL 102 106  CO2 22 24  GLUCOSE 133* 119*  BUN 27* 29*  CREATININE 1.46*  1.36*  CALCIUM 9.1 9.2   PT/INR No results for input(s): LABPROT, INR in the last 72 hours. Comprehensive Metabolic Panel:    Component Value Date/Time   NA 137 11/17/2021 0319   NA 135 11/16/2021 0330   K 3.9 11/17/2021 0319   K 3.6 11/16/2021 0330   CL 106 11/17/2021 0319   CL 102 11/16/2021 0330   CO2 24 11/17/2021 0319   CO2 22 11/16/2021 0330   BUN 29 (H) 11/17/2021 0319   BUN 27 (H) 11/16/2021 0330   CREATININE 1.36 (H) 11/17/2021 0319   CREATININE 1.46 (H) 11/16/2021 0330   GLUCOSE 119 (H) 11/17/2021 0319   GLUCOSE 133 (H) 11/16/2021 0330   CALCIUM 9.2 11/17/2021 0319   CALCIUM 9.1 11/16/2021 0330   AST 11 (L) 11/15/2021 0950   AST 18 10/02/2021 1740   ALT 12 11/15/2021 0950   ALT 18 10/02/2021 1740   ALKPHOS 98 11/15/2021 0950   ALKPHOS 96 10/02/2021 1740   BILITOT 0.6 11/15/2021 0950   BILITOT 0.4 10/02/2021 1740   PROT 7.8 11/15/2021 0950   PROT 7.1 10/02/2021 1740   ALBUMIN 4.4 11/15/2021 0950   ALBUMIN 3.8 10/02/2021 1740    Studies/Results: [place] Abscess Drain  Result Date: 11/16/2021 INDICATION: 76 year old with pain and redness in the right anterior abdominal wall and concern for infection involving the rectus sheath. There is also a complex fluid collection in the left anterior abdominal  wall. EXAM: 1. Ultrasound-guided aspiration and drain placement in the right abdominal wall abscess 2. Ultrasound-guided aspiration of left anterior abdominal wall fluid collection MEDICATIONS: Moderate sedation ANESTHESIA/SEDATION: Moderate (conscious) sedation was employed during this procedure. A total of Versed 3.'0mg'$  and fentanyl 100 mcg was administered intravenously at the order of the provider performing the procedure. Total intra-service moderate sedation time: 37 minutes. Patient's level of consciousness and vital signs were monitored continuously by radiology nurse throughout the procedure under the supervision of the provider performing the procedure. COMPLICATIONS:  None immediate. PROCEDURE: Informed written consent was obtained from the patient after a thorough discussion of the procedural risks, benefits and alternatives. All questions were addressed. A timeout was performed prior to the initiation of the procedure. Anterior abdominal wall was evaluated with ultrasound. The right side of the abdomen was prepped with chlorhexidine and sterile field was created. Maximal barrier sterile technique was utilized including caps, mask, sterile gowns, sterile gloves, sterile drape, hand hygiene and skin antiseptic. Skin was anesthetized with 1% lidocaine. Small incision was made. Using ultrasound guidance, Yueh catheter was directed into the collection and pink purulent fluid was aspirated. Superstiff Amplatz wire was advanced into the collection and the tract was dilated to accommodate a 10 [place] multipurpose drain. Approximately 7 mL of purulent fluid was removed. Catheter was sutured to skin and attached to suction bulb. Attention was directed to the left anterior abdominal fluid. A new sterile field was created. Left anterior abdomen was prepped and draped in sterile fashion. Skin was anesthetized with 1% lidocaine. Using ultrasound guidance, a spinal needle was directed into the collection but very little fluid could be aspirated. Therefore, a Yueh catheter was directed into collection using ultrasound guidance and approximately 1 mL purulent looking fluid was removed. Needle was repositioned but no additional fluid could be aspirated. Bandages placed. FINDINGS: Complex fluid collection along the right anterior abdominal wall and rectus sheath. This collection measured 5.0 x 1.8 x 3.2 cm. Purulent fluid was removed from this collection and a 10 French drain was placed. This collection was decompressed at the end of the procedure. Left anterior abdominal collection is complex and there appears to be surgical mesh material adjacent to this complex fluid. Despite having a Yueh  catheter well positioned in the collection, only a small amount of thick fluid could be aspirated. IMPRESSION: 1. Ultrasound-guided placement of a drainage catheter in the small abscess collection in the right anterior abdominal wall involving the rectus sheath. 7 mL of purulent fluid was removed and fluid was sent for culture. 2. Complex fluid collection along the left anterior abdominal wall which appears to be a associated with surgical mesh material. Only 1 mL of thick fluid could be aspirated and the collection was NOT decompressed at the end of the procedure. Electronically Signed   By: Markus Daft M.D.   On: 11/16/2021 17:42   [place] Abscess Drain  Result Date: 11/16/2021 INDICATION: 76 year old with pain and redness in the right anterior abdominal wall and concern for infection involving the rectus sheath. There is also a complex fluid collection in the left anterior abdominal wall. EXAM: 1. Ultrasound-guided aspiration and drain placement in the right abdominal wall abscess 2. Ultrasound-guided aspiration of left anterior abdominal wall fluid collection MEDICATIONS: Moderate sedation ANESTHESIA/SEDATION: Moderate (conscious) sedation was employed during this procedure. A total of Versed 3.'0mg'$  and fentanyl 100 mcg was administered intravenously at the order of the provider performing the procedure. Total intra-service moderate sedation time: 37 minutes. Patient's level of  consciousness and vital signs were monitored continuously by radiology nurse throughout the procedure under the supervision of the provider performing the procedure. COMPLICATIONS: None immediate. PROCEDURE: Informed written consent was obtained from the patient after a thorough discussion of the procedural risks, benefits and alternatives. All questions were addressed. A timeout was performed prior to the initiation of the procedure. Anterior abdominal wall was evaluated with ultrasound. The right side of the abdomen was prepped with  chlorhexidine and sterile field was created. Maximal barrier sterile technique was utilized including caps, mask, sterile gowns, sterile gloves, sterile drape, hand hygiene and skin antiseptic. Skin was anesthetized with 1% lidocaine. Small incision was made. Using ultrasound guidance, Yueh catheter was directed into the collection and pink purulent fluid was aspirated. Superstiff Amplatz wire was advanced into the collection and the tract was dilated to accommodate a 10 [place] multipurpose drain. Approximately 7 mL of purulent fluid was removed. Catheter was sutured to skin and attached to suction bulb. Attention was directed to the left anterior abdominal fluid. A new sterile field was created. Left anterior abdomen was prepped and draped in sterile fashion. Skin was anesthetized with 1% lidocaine. Using ultrasound guidance, a spinal needle was directed into the collection but very little fluid could be aspirated. Therefore, a Yueh catheter was directed into collection using ultrasound guidance and approximately 1 mL purulent looking fluid was removed. Needle was repositioned but no additional fluid could be aspirated. Bandages placed. FINDINGS: Complex fluid collection along the right anterior abdominal wall and rectus sheath. This collection measured 5.0 x 1.8 x 3.2 cm. Purulent fluid was removed from this collection and a 10 French drain was placed. This collection was decompressed at the end of the procedure. Left anterior abdominal collection is complex and there appears to be surgical mesh material adjacent to this complex fluid. Despite having a Yueh catheter well positioned in the collection, only a small amount of thick fluid could be aspirated. IMPRESSION: 1. Ultrasound-guided placement of a drainage catheter in the small abscess collection in the right anterior abdominal wall involving the rectus sheath. 7 mL of purulent fluid was removed and fluid was sent for culture. 2. Complex fluid collection along  the left anterior abdominal wall which appears to be a associated with surgical mesh material. Only 1 mL of thick fluid could be aspirated and the collection was NOT decompressed at the end of the procedure. Electronically Signed   By: Markus Daft M.D.   On: 11/16/2021 17:42   CT ABDOMEN PELVIS W CONTRAST  Result Date: 11/15/2021 CLINICAL DATA:  Right lower quadrant abdominal pain swelling for 2.5 days EXAM: CT ABDOMEN AND PELVIS WITH CONTRAST TECHNIQUE: Multidetector CT imaging of the abdomen and pelvis was performed using the standard protocol following bolus administration of intravenous contrast. RADIATION DOSE REDUCTION: This exam was performed according to the departmental dose-optimization program which includes automated exposure control, adjustment of the mA and/or kV according to patient size and/or use of iterative reconstruction technique. CONTRAST:  3m OMNIPAQUE IOHEXOL 300 MG/ML  SOLN COMPARISON:  07/18/2021 FINDINGS: Lower chest: Dense mitral valve calcification and mild aortic valve calcification. Descending thoracic aortic and right coronary artery atherosclerotic calcification. Upper normal heart size. Small inferior pericardial effusion. Small type 1 hiatal hernia. 6 by 4 by 3 mm (volume = 40 mm^3) right lower lobe nodule on image 10 series 4 appears unchanged. Hepatobiliary: Unremarkable Pancreas: Unremarkable Spleen: Unremarkable Adrenals/Urinary Tract: 8 by 7 mm hypodense lesion in the left mid kidney medially is technically too  small to characterize although statistically probably a cyst. Adrenal glands unremarkable. Stomach/Bowel: Small type 1 hiatal hernia. Gastric wall thickening probably secondary to nondistention. There is formed stool in the distal colon. Anastomotic staple line in the sigmoid colon. Scattered air-fluid levels in nondilated small bowel. Vascular/Lymphatic: Atherosclerosis is present, including aortoiliac atherosclerotic disease. Substantial atheromatous plaque  proximally in the celiac trunk and SMA. Reproductive: Linear bandlike scarring in the pubis extending above the penis at the site of the prior fluid collection. Central prostate gland calcifications. Other: No supplemental non-categorized findings. Musculoskeletal: Upper abdominal ventral hernia containing adipose tissue, image 28 series 2. Abnormal and potential loculated fluid collection measuring about 3.0 by 2.8 by 2.3 cm (volume = 10 cm^3) in the right rectus abdominus muscle, image 38 series 6. Abnormal appearance of the left rectus abdominus muscle including a 4.1 by 0.8 by 2.3 cm (volume = 4 cm^3) low-density collection with enhancing margins, and some increase thickening/nodularity in the subcutaneous tissues overlying the left rectus abdominus for example on image 43 of series 2 which represents change from the prior exam. There is some subcutaneous edema overlying both of the rectus abdominus muscles in the regions of abnormal nodular thickening. Spondylosis and degenerative disc disease at all lumbar levels with foraminal impingement most notable bilaterally at L4-5 and to a lesser degree at L3-4. IMPRESSION: 1. Abnormal fluid collections in both rectus abdominus muscles with overlying subcutaneous edema. The appearance could represent abscesses or possibly hematomas. 2. Scarring at the site of prior drained abscess along the pubis. 3. Small upper abdominal ventral midline hernia containing adipose tissue. 4. Stable 40 cubic mm right lower lobe pulmonary nodule. No follow-up needed if patient is low-risk.This recommendation follows the consensus statement: Guidelines for Management of Incidental Pulmonary Nodules Detected on CT Images: From the Fleischner Society 2017; Radiology 2017; 284:228-243. 5. Other imaging findings of potential clinical significance: Dense mitral valve and mild aortic valve calcifications. Aortic Atherosclerosis (ICD10-I70.0). Systemic atherosclerosis including plaque in the  proximal SMA and celiac trunk. Small inferior pericardial effusion. Small type 1 hiatal hernia. Scattered nonspecific air-levels in nondilated small bowel. Multilevel lower lumbar impingement. Electronically Signed   By: Van Clines M.D.   On: 11/15/2021 11:38   DG Chest Port 1 View  Result Date: 11/15/2021 CLINICAL DATA:  Abdominal pain EXAM: PORTABLE CHEST 1 VIEW COMPARISON:  None. FINDINGS: Transverse diameter of heart is slightly increased. Thoracic aorta is tortuous. Lung fields are clear of any infiltrates or pulmonary edema. There is no pleural effusion or pneumothorax. IMPRESSION: There are no signs of pulmonary edema or focal pulmonary consolidation. Electronically Signed   By: Elmer Picker M.D.   On: 11/15/2021 10:57      Armandina Gemma 11/17/2021   Patient ID: Richard Austin, male   DOB: August 16, 1946, 76 y.o.   MRN: [id number]

## 2021-11-18 LAB — CBC
HCT: 31.2 % — ABNORMAL LOW (ref 39.0–52.0)
Hemoglobin: 10 g/dL — ABNORMAL LOW (ref 13.0–17.0)
MCH: 28 pg (ref 26.0–34.0)
MCHC: 32.1 g/dL (ref 30.0–36.0)
MCV: 87.4 fL (ref 80.0–100.0)
Platelets: 236 10*3/uL (ref 150–400)
RBC: 3.57 MIL/uL — ABNORMAL LOW (ref 4.22–5.81)
RDW: 13.2 % (ref 11.5–15.5)
WBC: 9.5 10*3/uL (ref 4.0–10.5)
nRBC: 0 % (ref 0.0–0.2)

## 2021-11-18 LAB — BASIC METABOLIC PANEL
Anion gap: 9 (ref 5–15)
BUN: 23 mg/dL (ref 8–23)
CO2: 24 mmol/L (ref 22–32)
Calcium: 9.2 mg/dL (ref 8.9–10.3)
Chloride: 102 mmol/L (ref 98–111)
Creatinine, Ser: 1.05 mg/dL (ref 0.61–1.24)
GFR, Estimated: >60 mL/min (ref >60)
Glucose, Bld: 135 mg/dL — ABNORMAL HIGH (ref 70–99)
Potassium: 3.6 mmol/L (ref 3.5–5.1)
Sodium: 135 mmol/L (ref 135–145)

## 2021-11-18 NOTE — Progress Notes (Signed)
?   ? ? ? ? ?[facility] ? ?Date of Admission:  11/15/2021   Total days of inpatient antibiotics 3 ? ?Principal Problem: ?  Abdominal pain ?Active Problems: ?  AKI (acute kidney injury) ([place]) ?  BPH (benign prostatic hyperplasia) ?  GERD (gastroesophageal reflux disease) ?  HLD (hyperlipidemia) ?  Seizure disorder ([place]) ?  Pulmonary nodule ?  Normocytic anemia ?     ?    ?Assessment: ?64 YM with diverticulitis c/b multiple abdominal surgeries including colostomy(reversed) and complicated by hernia requiring mesh repair which was infected requiring multiple procedure for removal, primary repair of ventral hernia in 2014 admitted for rectus muscle infection.  ?  ?#Left rectus muscle abscess adjacent to mesh SP US guided aspiration ?#Right rectus muscle abscess SP drain placement on 3/8 ?-Admission in November, 2022 for groin abscess SP I&D in OR with Cx+ MRSA and Strep intermedius discharged on cefdinir x5 days. Pt states he took about 10 days of antibiotics, so unclear if MRSA was treated.  ?-CT showed R and LE rectus muscle abscess.IR consulted pt underwent US guided aspiration.  Drain placed in right rectus muscle. Per IR left rectus collection is adjacent to mesh. General surgery consulted and pt will require reconstructive surgery for mesh and foreign body removable, not advisable int he setting of recent CVA. ?Recommendations:  ?-Continue Vancomycin and ceftriaxone ?-Follow aspirate Cx( now growing staph aureus and strep intermedius ?-Anticipate about 4 weeks of antibiotics, followed by long term suppressive antibiotics as mesh in place(pending final surgical plan) ? ?Microbiology:   ?Antibiotics: ?Ceftriaxone 3/7-p ?Vancomycin 3/7-p ?  ?Cultures: ?Blood ?3/7 NG ?Other ?3/8 OR Cx staph aureus and strep intermedius, GS GPC ? ?SUBJECTIVE: ?No new complaints.  ? ?Review of Systems: ?Review of Systems  ?All other systems reviewed and are negative. ? ? ?Scheduled Meds: ? ezetimibe  10 mg  Oral Daily  ? levETIRAcetam  500 mg Oral BID  ? pantoprazole  40 mg Oral Daily  ? sodium chloride flush  5 mL Intracatheter Q8H  ? tamsulosin  0.4 mg Oral Daily  ? ?Continuous Infusions: ? cefTRIAXone (ROCEPHIN)  IV 2 g (11/18/21 0835)  ? lactated ringers 100 mL/hr at 11/18/21 1530  ? vancomycin 1,250 mg (11/17/21 2218)  ? ?PRN Meds:.acetaminophen **OR** acetaminophen, morphine injection ?Allergies  ?Allergen Reactions  ? Amlodipine Other (See Comments) and Swelling  ?  Muscle tightness, fatigue   ? Amlodipine Besylate   ?  Other reaction(s): Unknown ?Other reaction(s): Unknown  ? Nebivolol Hcl Other (See Comments) and Swelling  ?  Muscle tightness and fatigue  ?Other reaction(s): Unknown ?Other reaction(s): Unknown  ? Statins Other (See Comments)  ?  Muscle tightness and fatigue  ?Other reaction(s): Unknown ?Other reaction(s): Myositis, Unknown  ? Latex Rash  ? ? ?OBJECTIVE: ?Vitals:  ? 11/17/21 1356 11/17/21 2223 11/18/21 0646 11/18/21 1404  ?BP: 129/71 134/70 (!) 142/75 121/79  ?Pulse: 74 71 73 69  ?Resp: '18 15 16 18  '$ ?Temp: 97.9 ?F (36.6 ?C) 98 ?F (36.7 ?C) 98.2 ?F (36.8 ?C) 98.2 ?F (36.8 ?C)  ?TempSrc: Oral Oral Oral Oral  ?SpO2: 98% 98% 97% 100%  ?Weight:      ?Height:      ? ?Body mass index is 26.79 kg/m?. ? ?Physical Exam ?Constitutional:   ?   General: He is not in acute distress. ?   Appearance: He is normal weight. He is not toxic-appearing.  ?HENT:  ?   Head: Normocephalic and atraumatic.  ?  Right Ear: External ear normal.  ?   Left Ear: External ear normal.  ?   Nose: No congestion or rhinorrhea.  ?   Mouth/Throat:  ?   Mouth: Mucous membranes are moist.  ?   Pharynx: Oropharynx is clear.  ?Eyes:  ?   Extraocular Movements: Extraocular movements intact.  ?   Conjunctiva/sclera: Conjunctivae normal.  ?   Pupils: Pupils are equal, round, and reactive to light.  ?Cardiovascular:  ?   Rate and Rhythm: Normal rate and regular rhythm.  ?   Heart sounds: No murmur heard. ?  No friction rub. No gallop.   ?Pulmonary:  ?   Effort: Pulmonary effort is normal.  ?   Breath sounds: Normal breath sounds.  ?Abdominal:  ?   General: Abdomen is flat. Bowel sounds are normal.  ?   Palpations: Abdomen is soft.  ?   Comments: Right abdominal drain  ?Musculoskeletal:     ?   General: No swelling. Normal range of motion.  ?   Cervical back: Normal range of motion and neck supple.  ?Skin: ?   General: Skin is warm and dry.  ?Neurological:  ?   General: No focal deficit present.  ?   Mental Status: He is oriented to person, place, and time.  ?Psychiatric:     ?   Mood and Affect: Mood normal.  ? ? ? ? ?Lab Results ?Lab Results  ?Component Value Date  ? WBC 9.5 11/18/2021  ? HGB 10.0 (L) 11/18/2021  ? HCT 31.2 (L) 11/18/2021  ? MCV 87.4 11/18/2021  ? PLT 236 11/18/2021  ?  ?Lab Results  ?Component Value Date  ? CREATININE 1.05 11/18/2021  ? BUN 23 11/18/2021  ? NA 135 11/18/2021  ? K 3.6 11/18/2021  ? CL 102 11/18/2021  ? CO2 24 11/18/2021  ?  ?Lab Results  ?Component Value Date  ? ALT 12 11/15/2021  ? AST 11 (L) 11/15/2021  ? ALKPHOS 98 11/15/2021  ? BILITOT 0.6 11/15/2021  ?  ? ? ? ? ?Laurice Record, MD ?[facility] ?[facility] ?11/18/2021, 9:15 PM  ?

## 2021-11-18 NOTE — Plan of Care (Signed)
?  Problem: Activity: ?Goal: Risk for activity intolerance will decrease ?Outcome: Progressing ?  ?Problem: Safety: ?Goal: Ability to remain free from injury will improve ?Outcome: Progressing ?  ?Problem: Pain Managment: ?Goal: General experience of comfort will improve ?Outcome: Progressing ?  ?

## 2021-11-18 NOTE — Progress Notes (Signed)
Subjective: Patient doing well this morning.  No pain.  Eating well.  Drain in place  ROS: See above, otherwise other systems negative  Objective: Vital signs in last 24 hours: Temp:  [97.9 F (36.6 C)-98.2 F (36.8 C)] 98.2 F (36.8 C) (03/10 0646) Pulse Rate:  [71-74] 73 (03/10 0646) Resp:  [15-18] 16 (03/10 0646) BP: (129-142)/(70-75) 142/75 (03/10 0646) SpO2:  [97 %-98 %] 97 % (03/10 0646) Last BM Date : 11/17/21  Intake/Output from previous day: 03/09 0701 - 03/10 0700 In: 2200.8 [P.O.:360; I.V.:1734.8; IV Piggyback:100] Out: 20 [Drains:20] Intake/Output this shift: No intake/output data recorded.  PE: Abd: soft, NT except slightly around his JP drain.  JP with minimal bloody cloudy output.  +BS  Lab Results:  Recent Labs    11/17/21 0319 11/18/21 0319  WBC 12.6* 9.5  HGB 10.3* 10.0*  HCT 32.5* 31.2*  PLT 227 236   BMET Recent Labs    11/17/21 0319 11/18/21 0319  NA 137 135  K 3.9 3.6  CL 106 102  CO2 24 24  GLUCOSE 119* 135*  BUN 29* 23  CREATININE 1.36* 1.05  CALCIUM 9.2 9.2   PT/INR No results for input(s): LABPROT, INR in the last 72 hours. CMP     Component Value Date/Time   NA 135 11/18/2021 0319   K 3.6 11/18/2021 0319   CL 102 11/18/2021 0319   CO2 24 11/18/2021 0319   GLUCOSE 135 (H) 11/18/2021 0319   BUN 23 11/18/2021 0319   CREATININE 1.05 11/18/2021 0319   CALCIUM 9.2 11/18/2021 0319   PROT 7.8 11/15/2021 0950   ALBUMIN 4.4 11/15/2021 0950   AST 11 (L) 11/15/2021 0950   ALT 12 11/15/2021 0950   ALKPHOS 98 11/15/2021 0950   BILITOT 0.6 11/15/2021 0950   GFRNONAA >60 11/18/2021 0319   GFRAA >60 08/14/2018 0811   Lipase     Component Value Date/Time   LIPASE 12 11/15/2021 0950       Studies/Results: [place] Abscess Drain  Result Date: 11/16/2021 INDICATION: 76 year old with pain and redness in the right anterior abdominal wall and concern for infection involving the rectus sheath. There is also a complex fluid  collection in the left anterior abdominal wall. EXAM: 1. Ultrasound-guided aspiration and drain placement in the right abdominal wall abscess 2. Ultrasound-guided aspiration of left anterior abdominal wall fluid collection MEDICATIONS: Moderate sedation ANESTHESIA/SEDATION: Moderate (conscious) sedation was employed during this procedure. A total of Versed 3.'0mg'$  and fentanyl 100 mcg was administered intravenously at the order of the provider performing the procedure. Total intra-service moderate sedation time: 37 minutes. Patient's level of consciousness and vital signs were monitored continuously by radiology nurse throughout the procedure under the supervision of the provider performing the procedure. COMPLICATIONS: None immediate. PROCEDURE: Informed written consent was obtained from the patient after a thorough discussion of the procedural risks, benefits and alternatives. All questions were addressed. A timeout was performed prior to the initiation of the procedure. Anterior abdominal wall was evaluated with ultrasound. The right side of the abdomen was prepped with chlorhexidine and sterile field was created. Maximal barrier sterile technique was utilized including caps, mask, sterile gowns, sterile gloves, sterile drape, hand hygiene and skin antiseptic. Skin was anesthetized with 1% lidocaine. Small incision was made. Using ultrasound guidance, Yueh catheter was directed into the collection and pink purulent fluid was aspirated. Superstiff Amplatz wire was advanced into the collection and the tract was dilated to accommodate a 10 [place] multipurpose  drain. Approximately 7 mL of purulent fluid was removed. Catheter was sutured to skin and attached to suction bulb. Attention was directed to the left anterior abdominal fluid. A new sterile field was created. Left anterior abdomen was prepped and draped in sterile fashion. Skin was anesthetized with 1% lidocaine. Using ultrasound guidance, a spinal needle was  directed into the collection but very little fluid could be aspirated. Therefore, a Yueh catheter was directed into collection using ultrasound guidance and approximately 1 mL purulent looking fluid was removed. Needle was repositioned but no additional fluid could be aspirated. Bandages placed. FINDINGS: Complex fluid collection along the right anterior abdominal wall and rectus sheath. This collection measured 5.0 x 1.8 x 3.2 cm. Purulent fluid was removed from this collection and a 10 French drain was placed. This collection was decompressed at the end of the procedure. Left anterior abdominal collection is complex and there appears to be surgical mesh material adjacent to this complex fluid. Despite having a Yueh catheter well positioned in the collection, only a small amount of thick fluid could be aspirated. IMPRESSION: 1. Ultrasound-guided placement of a drainage catheter in the small abscess collection in the right anterior abdominal wall involving the rectus sheath. 7 mL of purulent fluid was removed and fluid was sent for culture. 2. Complex fluid collection along the left anterior abdominal wall which appears to be a associated with surgical mesh material. Only 1 mL of thick fluid could be aspirated and the collection was NOT decompressed at the end of the procedure. Electronically Signed   By: Markus Daft M.D.   On: 11/16/2021 17:42   [place] Abscess Drain  Result Date: 11/16/2021 INDICATION: 76 year old with pain and redness in the right anterior abdominal wall and concern for infection involving the rectus sheath. There is also a complex fluid collection in the left anterior abdominal wall. EXAM: 1. Ultrasound-guided aspiration and drain placement in the right abdominal wall abscess 2. Ultrasound-guided aspiration of left anterior abdominal wall fluid collection MEDICATIONS: Moderate sedation ANESTHESIA/SEDATION: Moderate (conscious) sedation was employed during this procedure. A total of Versed 3.'0mg'$  and  fentanyl 100 mcg was administered intravenously at the order of the provider performing the procedure. Total intra-service moderate sedation time: 37 minutes. Patient's level of consciousness and vital signs were monitored continuously by radiology nurse throughout the procedure under the supervision of the provider performing the procedure. COMPLICATIONS: None immediate. PROCEDURE: Informed written consent was obtained from the patient after a thorough discussion of the procedural risks, benefits and alternatives. All questions were addressed. A timeout was performed prior to the initiation of the procedure. Anterior abdominal wall was evaluated with ultrasound. The right side of the abdomen was prepped with chlorhexidine and sterile field was created. Maximal barrier sterile technique was utilized including caps, mask, sterile gowns, sterile gloves, sterile drape, hand hygiene and skin antiseptic. Skin was anesthetized with 1% lidocaine. Small incision was made. Using ultrasound guidance, Yueh catheter was directed into the collection and pink purulent fluid was aspirated. Superstiff Amplatz wire was advanced into the collection and the tract was dilated to accommodate a 10 [place] multipurpose drain. Approximately 7 mL of purulent fluid was removed. Catheter was sutured to skin and attached to suction bulb. Attention was directed to the left anterior abdominal fluid. A new sterile field was created. Left anterior abdomen was prepped and draped in sterile fashion. Skin was anesthetized with 1% lidocaine. Using ultrasound guidance, a spinal needle was directed into the collection but very little fluid could be  aspirated. Therefore, a Yueh catheter was directed into collection using ultrasound guidance and approximately 1 mL purulent looking fluid was removed. Needle was repositioned but no additional fluid could be aspirated. Bandages placed. FINDINGS: Complex fluid collection along the right anterior abdominal wall  and rectus sheath. This collection measured 5.0 x 1.8 x 3.2 cm. Purulent fluid was removed from this collection and a 10 French drain was placed. This collection was decompressed at the end of the procedure. Left anterior abdominal collection is complex and there appears to be surgical mesh material adjacent to this complex fluid. Despite having a Yueh catheter well positioned in the collection, only a small amount of thick fluid could be aspirated. IMPRESSION: 1. Ultrasound-guided placement of a drainage catheter in the small abscess collection in the right anterior abdominal wall involving the rectus sheath. 7 mL of purulent fluid was removed and fluid was sent for culture. 2. Complex fluid collection along the left anterior abdominal wall which appears to be a associated with surgical mesh material. Only 1 mL of thick fluid could be aspirated and the collection was NOT decompressed at the end of the procedure. Electronically Signed   By: Markus Daft M.D.   On: 11/16/2021 17:42    Anti-infectives: Anti-infectives (From admission, onward)    Start     Dose/Rate Route Frequency Ordered Stop   11/16/21 1000  cefTRIAXone (ROCEPHIN) 1 g in sodium chloride 0.9 % 100 mL IVPB  Status:  Discontinued        1 g 200 mL/hr over 30 Minutes Intravenous Daily 11/15/21 2047 11/15/21 2100   11/16/21 1000  cefTRIAXone (ROCEPHIN) 2 g in sodium chloride 0.9 % 100 mL IVPB        2 g 200 mL/hr over 30 Minutes Intravenous Daily 11/15/21 2100     11/15/21 2200  vancomycin (VANCOREADY) IVPB 1250 mg/250 mL        1,250 mg 166.7 mL/hr over 90 Minutes Intravenous Every 24 hours 11/15/21 2100     11/15/21 1345  vancomycin (VANCOCIN) IVPB 1000 mg/200 mL premix        1,000 mg 200 mL/hr over 60 Minutes Intravenous  Once 11/15/21 1332 11/15/21 1812   11/15/21 1345  cefTRIAXone (ROCEPHIN) 1 g in sodium chloride 0.9 % 100 mL IVPB        1 g 200 mL/hr over 30 Minutes Intravenous  Once 11/15/21 1334 11/15/21 1812         Assessment/Plan Rectus sheath abscesses likely secondary to chronically infected mesh -cont drain placement -long discussion with patient yesterday regarding long-term suppressive abx therapy for treatment of this infection over surgical intervention given how difficult this may be as well as how recent his CVA was. -he is surgically stable for DC home when cultures have finalized so ID can make recommendations on abx therapy at home.   ID - rocephin/ vancomycin 3/7>> VTE - ok for chemical dvt ppx after procedure FEN - regular diet Foley - none   Hx CVA 09/2021 Hx seizure HTN HLD GERD BPH Pulmonary nodule I reviewed Consultant   notes, last 24 h vitals and pain scores, last 24 h labs and trends, and last 24 h imaging results.   LOS: 3 days    Henreitta Cea , [facility] 11/18/2021, 9:30 AM Please see Amion for pager number during day hours 7:00am-4:30pm or 7:00am -11:30am on weekends

## 2021-11-18 NOTE — Progress Notes (Signed)
?  Progress Note ? ? ?Patient: Richard Austin MLY:[id number] DOB: [birth year] DOA: 11/15/2021     3 ?DOS: the patient was seen and examined on 11/18/2021 ?  ?Brief hospital course: ?76 year old past medical history significant for CVA, hypertension, hyperlipidemia, bladder cancer, seizure disorder, GERD, BPH presents complaining of right lower quadrant abdominal pain for 2 to 3 days.  Started out as a little bump in the area which has been growing.  He denies vomiting.  Constipation.  He had a multiple abdominal surgeries including colostomy and reversal, exploratory laparotomy, several hernia repairs. ? ?He presented with leukocytosis, hemoglobin 12, creatinine 1.3, CT showed abnormal fluid collection in both rectus abdominis muscles with underlying subcutaneous edema,.  Suspicious for abscess or possible hematoma. ?Surgery was consulted, recommended IR consultation for aspiration and drainage. Underwent drain placement and aspiration by IR 3/8.  ID consulted to assist with the length of treatment.  Patient will require 4 weeks of antibiotics followed by long-term suppression antibiotics as mesh is in place (pending final surgical plan) ? ?Assessment and Plan: ?* Abdominal pain ?Rectus sheath abscesses, adjacent to mesh ?CT showing abnormal fluid collections in both rectus abdominis muscles with overlying subcutaneous edema, appearance suspicious for abscesses or possibly hematoma.  ?-He presented with leukocytosis.  ?-Continue vancomycin and ceftriaxone. ?-Surgery recommended IR consultation for aspiration and or drainage placed. ?-blood cultures: no growth to date.  ?-underwent ultrasound guided aspiration and drain placement into abdominal wall abscess by IR 3/8.  Pink purulent fluid removed from right abdominal collection.  Culture growing: Abundant streptococcus intermedius. Awaiting sensitivity.  ?-ID consulted, recommend 4 weeks of antibiotics followed by chronic suppression depending on final surgical plans.   ?Surgery would like to avoid surgery intervention due to recent stroke (two month ago) ?Awaiting ID rec  ? ?Pulmonary nodule ?CT showing stable 40 cubic mm right lower lobe pulmonary nodule. ?-Outpatient follow-up ? ?AKI (acute kidney injury) ([place]) ?-Likely prerenal from dehydration. CRbaseline 1.0-1.1. ?-Continue with IV fluids ?-Continue to hold chlorthalidone and lisinopril.  ?-Creatinine down to 1.3 improve ? ?Seizure disorder ([place]) ?-Continue Keppra ? ?HLD (hyperlipidemia) ?-Continue Zetia ? ?GERD (gastroesophageal reflux disease) ?-Continue Protonix ? ?BPH (benign prostatic hyperplasia) ?-Continue Flomax ? ?Normocytic anemia ?Presents with a hemoglobin of 12.  Hemoglobin has dropped to 10.  Continue to monitor due to concern with hematoma ?Not on anticoagulation. ? ? ? ? ?  ? ?Subjective: he report mild pain.  ?No new complaints.  ? ? ?Physical Exam: ?Vitals:  ? 11/17/21 1356 11/17/21 2223 11/18/21 0646 11/18/21 1404  ?BP: 129/71 134/70 (!) 142/75 121/79  ?Pulse: 74 71 73 69  ?Resp: '18 15 16 18  '$ ?Temp: 97.9 ?F (36.6 ?C) 98 ?F (36.7 ?C) 98.2 ?F (36.8 ?C) 98.2 ?F (36.8 ?C)  ?TempSrc: Oral Oral Oral Oral  ?SpO2: 98% 98% 97% 100%  ?Weight:      ?Height:      ? ?General; Alert.  ?CVS; S 1, S 2 RRR ?Abdomen; drain in place, less redness.  ? ?Data Reviewed: ? ?Cbc and bmet reviewed.   ? ?Family Communication: care discussed with wife ? ?Disposition: ?Status is: Inpatient ?Remains inpatient appropriate because: admitted with Abscess  ? Planned Discharge Destination: Home ? ? ? ?Time spent: 45 minutes ? ?Author: ?Elmarie Shiley, MD ?11/18/2021 3:03 PM ? ?For on call review [email or web address].  ?

## 2021-11-18 NOTE — Progress Notes (Signed)
Pharmacy Antibiotic Note ? ?Richard Austin is a 76 y.o. male with multiple prior abd surgeries, admitted on 11/15/2021 with fluid collections in rectus sheath with concern for infection. Pharmacy has been consulted for Vancomycin dosing along with Rocephin per MD.  ? ?The patient's renal function has improved with SCr 1.05 - noted some fluctuations based on previous labs. Estimated AUC still in range so will not adjust for now.  ? ?Plan: ?- Continue Vancomycin 1250 mg IV every 24 hours (eAUC 408, SCr 1.05, Vd 0.72) ?- Continue Rocephin 2g IV every 24 hours ?- Further narrowing pending updates on cultures ? ?Height: '5\' 8"'$  (172.7 cm) ?Weight: 79.9 kg (176 lb 3.2 oz) ?IBW/kg (Calculated) : 68.4 ? ?Temp (24hrs), Avg:98 ?F (36.7 ?C), Min:97.9 ?F (36.6 ?C), Max:98.2 ?F (36.8 ?C) ? ?Recent Labs  ?Lab 11/15/21 ?0950 11/16/21 ?0330 11/17/21 ?0319 11/18/21 ?0319  ?WBC 16.7* 16.8* 12.6* 9.5  ?CREATININE 1.38* 1.46* 1.36* 1.05  ? ?  ?Estimated Creatinine Clearance: 58.8 mL/min (by C-G formula based on SCr of 1.05 mg/dL).   ? ?Allergies  ?Allergen Reactions  ? Amlodipine Other (See Comments) and Swelling  ?  Muscle tightness, fatigue   ? Amlodipine Besylate   ?  Other reaction(s): Unknown ?Other reaction(s): Unknown  ? Nebivolol Hcl Other (See Comments) and Swelling  ?  Muscle tightness and fatigue  ?Other reaction(s): Unknown ?Other reaction(s): Unknown  ? Statins Other (See Comments)  ?  Muscle tightness and fatigue  ?Other reaction(s): Unknown ?Other reaction(s): Myositis, Unknown  ? Latex Rash  ? ? ?Antimicrobials this admission: ?Vanc 3/7 >> ?Rocephin 3/7 >> ? ?Microbiology results: ?3/7 BCx >> ng x 2d ?3/8 R-abd wall abscess >> few GPC on GS ?3/8 L-abd wall abscess >> mod GPC on GS  ? ?Thank you for allowing pharmacy to be a part of this patient?s care. ? ?Alycia Rossetti, PharmD, BCPS ?Infectious Diseases Clinical Pharmacist ?11/18/2021 8:00 AM  ? ?**Pharmacist phone directory can now be found on [email or web address] ([email or web address]).   Listed under [place].  ?

## 2021-11-19 ENCOUNTER — Encounter: Payer: Self-pay | Admitting: Internal Medicine

## 2021-11-19 ENCOUNTER — Other Ambulatory Visit (HOSPITAL_COMMUNITY): Payer: Self-pay

## 2021-11-19 MED ORDER — DOXYCYCLINE HYCLATE 100 MG PO TABS
100.0000 mg | ORAL_TABLET | Freq: Two times a day (BID) | ORAL | Status: DC
  Administered 2021-11-19: 100 mg via ORAL
  Filled 2021-11-19 (×2): qty 1

## 2021-11-19 MED ORDER — DOXYCYCLINE HYCLATE 100 MG PO TABS: 100.0000 mg | ORAL_TABLET | Freq: Two times a day (BID) | ORAL | 0 refills | Status: DC

## 2021-11-19 MED ORDER — ENOXAPARIN SODIUM 40 MG/0.4ML IJ SOSY
40.0000 mg | PREFILLED_SYRINGE | INTRAMUSCULAR | Status: DC
Start: 2021-11-19 — End: 2021-11-19
  Administered 2021-11-19: 40 mg via SUBCUTANEOUS

## 2021-11-19 MED ORDER — HYDROCODONE-ACETAMINOPHEN 5-325 MG PO TABS
1.0000 | ORAL_TABLET | Freq: Three times a day (TID) | ORAL | 0 refills | Status: AC | PRN
Start: 2021-11-19 — End: 2021-11-24

## 2021-11-19 MED ORDER — CEFADROXIL 500 MG PO CAPS: 1000.0000 mg | ORAL_CAPSULE | Freq: Two times a day (BID) | ORAL | 0 refills | Status: DC

## 2021-11-19 MED ORDER — CEFADROXIL 500 MG PO CAPS: 1000.0000 mg | ORAL_CAPSULE | Freq: Two times a day (BID) | ORAL | Status: DC

## 2021-11-19 MED ORDER — NORMAL SALINE FLUSH 0.9 % IV SOLN
3.0000 mL | Freq: Every day | INTRAVENOUS | 0 refills | Status: AC
  Filled 2021-11-19: qty 200, 40d supply, fill #0

## 2021-11-19 NOTE — Progress Notes (Signed)
Subjective/Chief Complaint: Pt with no s/f changes Min drain output   Objective: Vital signs in last 24 hours: Temp:  [97.9 F (36.6 C)-98.2 F (36.8 C)] 97.9 F (36.6 C) (03/11 0617) Pulse Rate:  [66-69] 67 (03/11 0617) Resp:  [16-20] 16 (03/11 0617) BP: (121-142)/(69-79) 138/71 (03/11 0617) SpO2:  [99 %-100 %] 99 % (03/11 0617) Last BM Date : 11/17/21  Intake/Output from previous day: 03/10 0701 - 03/11 0700 In: 6  Out: 20 [Drains:20] Intake/Output this shift: No intake/output data recorded.  PE:  Constitutional: No acute distress, conversant, appears states age. Eyes: Anicteric sclerae, moist conjunctiva, no lid lag Lungs: Clear to auscultation bilaterally, normal respiratory effort CV: regular rate and rhythm, no murmurs, no peripheral edema, pedal pulses 2+ GI: Soft, no masses or hepatosplenomegaly, non-tender to palpation, dr in place and serous, no erythema Skin: No rashes, palpation reveals normal turgor Psychiatric: appropriate judgment and insight, oriented to person, place, and time   Lab Results:  Recent Labs    11/17/21 0319 11/18/21 0319  WBC 12.6* 9.5  HGB 10.3* 10.0*  HCT 32.5* 31.2*  PLT 227 236   BMET Recent Labs    11/17/21 0319 11/18/21 0319  NA 137 135  K 3.9 3.6  CL 106 102  CO2 24 24  GLUCOSE 119* 135*  BUN 29* 23  CREATININE 1.36* 1.05  CALCIUM 9.2 9.2   PT/INR No results for input(s): LABPROT, INR in the last 72 hours. ABG No results for input(s): PHART, HCO3 in the last 72 hours.  Invalid input(s): PCO2, PO2  Studies/Results: No results found.  Anti-infectives: Anti-infectives (From admission, onward)    Start     Dose/Rate Route Frequency Ordered Stop   11/16/21 1000  cefTRIAXone (ROCEPHIN) 1 g in sodium chloride 0.9 % 100 mL IVPB  Status:  Discontinued        1 g 200 mL/hr over 30 Minutes Intravenous Daily 11/15/21 2047 11/15/21 2100   11/16/21 1000  cefTRIAXone (ROCEPHIN) 2 g in sodium chloride 0.9 % 100 mL  IVPB        2 g 200 mL/hr over 30 Minutes Intravenous Daily 11/15/21 2100     11/15/21 2200  vancomycin (VANCOREADY) IVPB 1250 mg/250 mL        1,250 mg 166.7 mL/hr over 90 Minutes Intravenous Every 24 hours 11/15/21 2100     11/15/21 1345  vancomycin (VANCOCIN) IVPB 1000 mg/200 mL premix        1,000 mg 200 mL/hr over 60 Minutes Intravenous  Once 11/15/21 1332 11/15/21 1812   11/15/21 1345  cefTRIAXone (ROCEPHIN) 1 g in sodium chloride 0.9 % 100 mL IVPB        1 g 200 mL/hr over 30 Minutes Intravenous  Once 11/15/21 1334 11/15/21 1812      No results found for: LABMICR, MICROALBUR  Results for orders placed or performed during the hospital encounter of 11/15/21  Resp Panel by RT-PCR (Flu A&B, Covid) Nasopharyngeal Swab     Status: None   Collection Time: 11/15/21 10:50 AM   Specimen: Nasopharyngeal Swab; Nasopharyngeal(NP) swabs in vial transport medium  Result Value Ref Range Status   SARS Coronavirus 2 by RT PCR NEGATIVE NEGATIVE Final    Comment: (NOTE) SARS-CoV-2 target nucleic acids are NOT DETECTED.  The SARS-CoV-2 RNA is generally detectable in upper respiratory specimens during the acute phase of infection. The lowest concentration of SARS-CoV-2 viral copies this assay can detect is 138 copies/mL. A negative result does not preclude  SARS-Cov-2 infection and should not be used as the sole basis for treatment or other patient management decisions. A negative result may occur with  improper specimen collection/handling, submission of specimen other than nasopharyngeal swab, presence of viral mutation(s) within the areas targeted by this assay, and inadequate number of viral copies(<138 copies/mL). A negative result must be combined with clinical observations, patient history, and epidemiological information. The expected result is Negative.  Fact Sheet for Patients:  EntrepreneurPulse.com.au  Fact Sheet for Healthcare Providers:   IncredibleEmployment.be  This test is no t yet approved or cleared by the [place] FDA and  has been authorized for detection and/or diagnosis of SARS-CoV-2 by FDA under an Emergency Use Authorization (EUA). This EUA will remain  in effect (meaning this test can be used) for the duration of the COVID-19 declaration under Section 564(b)(1) of the Act, 21 U.S.C.section 360bbb-3(b)(1), unless the authorization is terminated  or revoked sooner.       Influenza A by PCR NEGATIVE NEGATIVE Final   Influenza B by PCR NEGATIVE NEGATIVE Final    Comment: (NOTE) The Xpert Xpress SARS-CoV-2/FLU/RSV plus assay is intended as an aid in the diagnosis of influenza from Nasopharyngeal swab specimens and should not be used as a sole basis for treatment. Nasal washings and aspirates are unacceptable for Xpert Xpress SARS-CoV-2/FLU/RSV testing.  Fact Sheet for Patients: EntrepreneurPulse.com.au  Fact Sheet for Healthcare Providers: IncredibleEmployment.be  This test is not yet approved or cleared by the [place] FDA and has been authorized for detection and/or diagnosis of SARS-CoV-2 by FDA under an Emergency Use Authorization (EUA). This EUA will remain in effect (meaning this test can be used) for the duration of the COVID-19 declaration under Section 564(b)(1) of the Act, 21 U.S.C. section 360bbb-3(b)(1), unless the authorization is terminated or revoked.  Performed at [facility], [address], [place], [place] [address]   Blood culture (routine x 2)     Status: None (Preliminary result)   Collection Time: 11/15/21  2:23 PM   Specimen: BLOOD LEFT HAND  Result Value Ref Range Status   Specimen Description   Final    BLOOD LEFT HAND Performed at [facility], [address], [place], [place] [address]    Special Requests   Final    BOTTLES DRAWN AEROBIC AND ANAEROBIC Blood Culture  adequate volume Performed at [facility], [address] [place], [place] [address]    Culture   Final    NO GROWTH 3 DAYS Performed at [facility], [place] [address], [place], [place] [address]    Report Status PENDING  Incomplete  Blood culture (routine x 2)     Status: None (Preliminary result)   Collection Time: 11/15/21  2:29 PM   Specimen: Right Antecubital; Blood  Result Value Ref Range Status   Specimen Description   Final    RIGHT ANTECUBITAL Performed at [facility], [address], [place], [place] [address]    Special Requests   Final    BOTTLES DRAWN AEROBIC AND ANAEROBIC Blood Culture adequate volume Performed at [facility], [address] [place], [place] [address]    Culture   Final    NO GROWTH 3 DAYS Performed at [facility], [place] [address], [place], [place] [address]    Report Status PENDING  Incomplete  Aerobic/Anaerobic Culture w Gram Stain (surgical/deep wound)     Status: None (Preliminary result)   Collection Time: 11/16/21  4:56 PM  Specimen: Abscess  Result Value Ref Range Status   Specimen Description   Final    ABSCESS Performed at [facility] [address] [place], [place] [address]    Special Requests   Final    LEFT ABDOMINAL WALL Performed at [facility], [place] [address] [place], [place] [address]    Gram Stain   Final    NO SQUAMOUS EPITHELIAL CELLS SEEN ABUNDANT WBC SEEN MODERATE GRAM POSITIVE COCCI    Culture   Final    ABUNDANT STAPHYLOCOCCUS AUREUS SUSCEPTIBILITIES TO FOLLOW Performed at [facility], [place] [address], [place], [place] [address]    Report Status PENDING  Incomplete  Aerobic/Anaerobic Culture w Gram Stain (surgical/deep wound)     Status: None (Preliminary result)   Collection Time: 11/16/21  4:56 PM   Specimen: Abscess  Result Value Ref Range Status   Specimen Description   Final     ABSCESS Performed at [facility] [address], [place], [place] [address]    Special Requests   Final    RIGHT ABDOMINAL WALL Performed at [facility], [place] [address] [place], [place] [address]    Gram Stain   Final    NO SQUAMOUS EPITHELIAL CELLS SEEN ABUNDANT WBC SEEN FEW GRAM POSITIVE COCCI Performed at [facility], [place] 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   ID - rocephin/ vancomycin 3/7>> VTE - [place] for chemical dvt ppx after procedure FEN - regular diet Foley - none   Hx CVA 09/2021 Hx seizure HTN HLD GERD BPH Pulmonary nodule I reviewed Consultant   notes, last 24 h vitals and pain scores, last 24 h labs and trends, and last 24 h imaging results.  LOS: 4 days    Richard Austin 11/19/2021

## 2021-11-19 NOTE — Discharge Instructions (Addendum)
Patient will need to flush drain daily with 3-5 cc 0.9% saline, record output daily, dressing changes every 2-3 days or earlier if soiled.   ?IR scheduler will call to set up 10-14 day outpatient follow up appointment next week. ?Call [phone] with questions or concerns. ?

## 2021-11-19 NOTE — Progress Notes (Signed)
Teaching done with patient for flushing and emptying drain, changing dressing.  Supplies given.Marland Kitchen discharge paperwork given  ?

## 2021-11-19 NOTE — Discharge Summary (Signed)
Physician Discharge Summary   Patient: Richard Austin MRN: [id number] DOB: 1946/05/09  Admit date:     11/15/2021  Discharge date: 11/19/21  Discharge Physician: Elmarie Shiley   PCP: [name], DO   Recommendations at discharge:    Needs follow up with surgery for further care abdominal wound.  Follow up with ID for further care of wound infection and antibiotics.  Follow up with IR for drain care.  Needs follow [facility] to follow renal function.    Discharge Diagnoses: Principal Problem:   Abdominal pain Active Problems:   AKI (acute kidney injury) ([facility])   Pulmonary nodule   BPH (benign prostatic hyperplasia)   GERD (gastroesophageal reflux disease)   HLD (hyperlipidemia)   Seizure disorder (HCC)   Normocytic anemia  Resolved Problems:   * No resolved hospital problems. *  Hospital Course: 76 year old past medical history significant for CVA, hypertension, hyperlipidemia, bladder cancer, seizure disorder, GERD, BPH presents complaining of right lower quadrant abdominal pain for 2 to 3 days.  Started out as a little bump in the area which has been growing.  He denies vomiting.  Constipation.  He had a multiple abdominal surgeries including colostomy and reversal, exploratory laparotomy, several hernia repairs.  He presented with leukocytosis, hemoglobin 12, creatinine 1.3, CT showed abnormal fluid collection in both rectus abdominis muscles with underlying subcutaneous edema,.  Suspicious for abscess or possible hematoma. Surgery was consulted, recommended IR consultation for aspiration and drainage. Underwent drain placement and aspiration by IR 3/8.  ID consulted to assist with the length of treatment.  Patient will require 4 weeks of antibiotics followed by long-term suppression antibiotics as mesh is in place (pending final surgical plan)  Culture grew: MRSA, staph sensitive to Doxy and streptococcus intermedius. Discussed with Dr Linus Salmons, plan  to discharge on  cefadroxil and Doxy for one month.   Assessment and Plan: * Abdominal pain Rectus sheath abscesses, adjacent to mesh CT showing abnormal fluid collections in both rectus abdominis muscles with overlying subcutaneous edema, appearance suspicious for abscesses or possibly hematoma.  -He presented with leukocytosis.  -Continue vancomycin and ceftriaxone. -Surgery recommended IR consultation for aspiration and or drainage placed. -blood cultures: no growth to date.  -underwent ultrasound guided aspiration and drain placement into abdominal wall abscess by IR 3/8.  Pink purulent fluid removed from right abdominal collection.  Culture growing: Abundant streptococcus intermedius and MRSA staph.  -ID consulted, recommend 4 weeks of antibiotics followed by chronic suppression depending on final surgical plans.  Surgery would like to avoid surgery intervention due to recent stroke (two month ago) Plan to discharge on Doxy and cefadroxil for one month. He will need to follow up with ID for further antibiotics recommendation, chronic suppression.   Pulmonary nodule CT showing stable 40 cubic mm right lower lobe pulmonary nodule. -Outpatient follow-up  AKI (acute kidney injury) (HCC) -Likely prerenal from dehydration. CRbaseline 1.0-1.1. -Continue with IV fluids -Continue to hold chlorthalidone -Creatinine down to 1.0. improved Counseling provided in regards ibuprofen intake.   Seizure disorder (HCC) -Continue Keppra  HLD (hyperlipidemia) -Continue Zetia  GERD (gastroesophageal reflux disease) -Continue Protonix  BPH (benign prostatic hyperplasia) -Continue Flomax  Normocytic anemia Presents with a hemoglobin of 12.  Hemoglobin has dropped to 10.  Hb remain stable          Consultants: Stable.  Procedures performed: Drain placement Disposition: Home Diet recommendation:  Discharge Diet Orders (From admission, onward)     Start     Ordered  11/19/21 0000  Diet - low sodium  heart healthy        11/19/21 1420           Cardiac diet DISCHARGE MEDICATION: Allergies as of 11/19/2021       Reactions   Amlodipine Other (See Comments), Swelling   Muscle tightness, fatigue    Amlodipine Besylate    Other reaction(s): Unknown Other reaction(s): Unknown   Nebivolol Hcl Other (See Comments), Swelling   Muscle tightness and fatigue  Other reaction(s): Unknown Other reaction(s): Unknown   Statins Other (See Comments)   Muscle tightness and fatigue  Other reaction(s): Unknown Other reaction(s): Myositis, Unknown   Latex Rash        Medication List     STOP taking these medications    chlorthalidone 25 MG tablet Commonly known as: HYGROTON   ibuprofen 800 MG tablet Commonly known as: ADVIL   Krill Oil 1000 MG Caps       TAKE these medications    aspirin EC 81 MG tablet Take 81 mg by mouth daily. Swallow whole.   Azelastine HCl 137 MCG/SPRAY Soln   cefadroxil 500 MG capsule Commonly known as: DURICEF Take 2 capsules (1,000 mg total) by mouth 2 (two) times daily.   doxycycline 100 MG tablet Commonly known as: VIBRA-TABS Take 1 tablet (100 mg total) by mouth 2 (two) times daily.   ezetimibe 10 MG tablet Commonly known as: ZETIA Take 1 tablet (10 mg total) by mouth daily.   fluticasone 50 MCG/ACT nasal spray Commonly known as: FLONASE Place 2 sprays into both nostrils 2 (two) times daily as needed for allergies or rhinitis.   HYDROcodone-acetaminophen 5-325 MG tablet Commonly known as: NORCO/VICODIN Take 1 tablet by mouth every 8 (eight) hours as needed for up to 5 days. What changed: reasons to take this   levETIRAcetam 500 MG tablet Commonly known as: Keppra Take 1 tablet (500 mg total) by mouth 2 (two) times daily.   lisinopril 40 MG tablet Commonly known as: ZESTRIL Take 40 mg by mouth every morning.   multivitamin tablet Take 1 tablet by mouth daily.   omeprazole 40 MG capsule Commonly known as: PRILOSEC Take 40  mg by mouth daily.   Refresh 1.4-0.6 % Soln Generic drug: Polyvinyl Alcohol-Povidone PF Place 1 drop into both eyes daily as needed (dry eyes).   Saline Flush 0.9 % Soln Give 3 - 5 mls via catheter daily for 20 days   tadalafil 20 MG tablet Commonly known as: CIALIS Take 20 mg by mouth once as needed (very high blood pressure with fluctuations).   tamsulosin 0.4 MG Caps capsule Commonly known as: FLOMAX Take 0.4 mg by mouth daily. Takes at 2100               Discharge Care Instructions  (From admission, onward)           Start     Ordered   11/19/21 0000  Discharge wound care:       Comments: Drainage care.   11/19/21 1420            Follow-up Information     Armandina Gemma, MD Follow up on 12/13/2021.   Specialty: General Surgery Why: 1:30pm,  please arrive 30 minutes prior to your appointment for paperwork and check in process.  Please bring photo ID and insurance card Contact information: [address] [place] [address] [phone]  Discharge Exam: Filed Weights   11/15/21 0943 11/15/21 2050  Weight: 81.2 kg 79.9 kg   General; NAD Abdomen; soft, less redness , drain in place.   Condition at discharge: stable  The results of significant diagnostics from this hospitalization (including imaging, microbiology, ancillary and laboratory) are listed below for reference.   Imaging Studies: [place] Abscess Drain  Result Date: 11/16/2021 INDICATION: 76 year old with pain and redness in the right anterior abdominal wall and concern for infection involving the rectus sheath. There is also a complex fluid collection in the left anterior abdominal wall. EXAM: 1. Ultrasound-guided aspiration and drain placement in the right abdominal wall abscess 2. Ultrasound-guided aspiration of left anterior abdominal wall fluid collection MEDICATIONS: Moderate sedation ANESTHESIA/SEDATION: Moderate (conscious) sedation was employed during this  procedure. A total of Versed 3.'0mg'$  and fentanyl 100 mcg was administered intravenously at the order of the provider performing the procedure. Total intra-service moderate sedation time: 37 minutes. Patient's level of consciousness and vital signs were monitored continuously by radiology nurse throughout the procedure under the supervision of the provider performing the procedure. COMPLICATIONS: None immediate. PROCEDURE: Informed written consent was obtained from the patient after a thorough discussion of the procedural risks, benefits and alternatives. All questions were addressed. A timeout was performed prior to the initiation of the procedure. Anterior abdominal wall was evaluated with ultrasound. The right side of the abdomen was prepped with chlorhexidine and sterile field was created. Maximal barrier sterile technique was utilized including caps, mask, sterile gowns, sterile gloves, sterile drape, hand hygiene and skin antiseptic. Skin was anesthetized with 1% lidocaine. Small incision was made. Using ultrasound guidance, Yueh catheter was directed into the collection and pink purulent fluid was aspirated. Superstiff Amplatz wire was advanced into the collection and the tract was dilated to accommodate a 10 [place] multipurpose drain. Approximately 7 mL of purulent fluid was removed. Catheter was sutured to skin and attached to suction bulb. Attention was directed to the left anterior abdominal fluid. A new sterile field was created. Left anterior abdomen was prepped and draped in sterile fashion. Skin was anesthetized with 1% lidocaine. Using ultrasound guidance, a spinal needle was directed into the collection but very little fluid could be aspirated. Therefore, a Yueh catheter was directed into collection using ultrasound guidance and approximately 1 mL purulent looking fluid was removed. Needle was repositioned but no additional fluid could be aspirated. Bandages placed. FINDINGS: Complex fluid collection  along the right anterior abdominal wall and rectus sheath. This collection measured 5.0 x 1.8 x 3.2 cm. Purulent fluid was removed from this collection and a 10 French drain was placed. This collection was decompressed at the end of the procedure. Left anterior abdominal collection is complex and there appears to be surgical mesh material adjacent to this complex fluid. Despite having a Yueh catheter well positioned in the collection, only a small amount of thick fluid could be aspirated. IMPRESSION: 1. Ultrasound-guided placement of a drainage catheter in the small abscess collection in the right anterior abdominal wall involving the rectus sheath. 7 mL of purulent fluid was removed and fluid was sent for culture. 2. Complex fluid collection along the left anterior abdominal wall which appears to be a associated with surgical mesh material. Only 1 mL of thick fluid could be aspirated and the collection was NOT decompressed at the end of the procedure. Electronically Signed   By: Markus Daft M.D.   On: 11/16/2021 17:42   [place] Abscess Drain  Result Date: 11/16/2021 INDICATION: 76 year old  with pain and redness in the right anterior abdominal wall and concern for infection involving the rectus sheath. There is also a complex fluid collection in the left anterior abdominal wall. EXAM: 1. Ultrasound-guided aspiration and drain placement in the right abdominal wall abscess 2. Ultrasound-guided aspiration of left anterior abdominal wall fluid collection MEDICATIONS: Moderate sedation ANESTHESIA/SEDATION: Moderate (conscious) sedation was employed during this procedure. A total of Versed 3.'0mg'$  and fentanyl 100 mcg was administered intravenously at the order of the provider performing the procedure. Total intra-service moderate sedation time: 37 minutes. Patient's level of consciousness and vital signs were monitored continuously by radiology nurse throughout the procedure under the supervision of the provider performing  the procedure. COMPLICATIONS: None immediate. PROCEDURE: Informed written consent was obtained from the patient after a thorough discussion of the procedural risks, benefits and alternatives. All questions were addressed. A timeout was performed prior to the initiation of the procedure. Anterior abdominal wall was evaluated with ultrasound. The right side of the abdomen was prepped with chlorhexidine and sterile field was created. Maximal barrier sterile technique was utilized including caps, mask, sterile gowns, sterile gloves, sterile drape, hand hygiene and skin antiseptic. Skin was anesthetized with 1% lidocaine. Small incision was made. Using ultrasound guidance, Yueh catheter was directed into the collection and pink purulent fluid was aspirated. Superstiff Amplatz wire was advanced into the collection and the tract was dilated to accommodate a 10 [place] multipurpose drain. Approximately 7 mL of purulent fluid was removed. Catheter was sutured to skin and attached to suction bulb. Attention was directed to the left anterior abdominal fluid. A new sterile field was created. Left anterior abdomen was prepped and draped in sterile fashion. Skin was anesthetized with 1% lidocaine. Using ultrasound guidance, a spinal needle was directed into the collection but very little fluid could be aspirated. Therefore, a Yueh catheter was directed into collection using ultrasound guidance and approximately 1 mL purulent looking fluid was removed. Needle was repositioned but no additional fluid could be aspirated. Bandages placed. FINDINGS: Complex fluid collection along the right anterior abdominal wall and rectus sheath. This collection measured 5.0 x 1.8 x 3.2 cm. Purulent fluid was removed from this collection and a 10 French drain was placed. This collection was decompressed at the end of the procedure. Left anterior abdominal collection is complex and there appears to be surgical mesh material adjacent to this complex  fluid. Despite having a Yueh catheter well positioned in the collection, only a small amount of thick fluid could be aspirated. IMPRESSION: 1. Ultrasound-guided placement of a drainage catheter in the small abscess collection in the right anterior abdominal wall involving the rectus sheath. 7 mL of purulent fluid was removed and fluid was sent for culture. 2. Complex fluid collection along the left anterior abdominal wall which appears to be a associated with surgical mesh material. Only 1 mL of thick fluid could be aspirated and the collection was NOT decompressed at the end of the procedure. Electronically Signed   By: Markus Daft M.D.   On: 11/16/2021 17:42   CT ABDOMEN PELVIS W CONTRAST  Result Date: 11/15/2021 CLINICAL DATA:  Right lower quadrant abdominal pain swelling for 2.5 days EXAM: CT ABDOMEN AND PELVIS WITH CONTRAST TECHNIQUE: Multidetector CT imaging of the abdomen and pelvis was performed using the standard protocol following bolus administration of intravenous contrast. RADIATION DOSE REDUCTION: This exam was performed according to the departmental dose-optimization program which includes automated exposure control, adjustment of the mA and/or kV according to patient  size and/or use of iterative reconstruction technique. CONTRAST:  72m OMNIPAQUE IOHEXOL 300 MG/ML  SOLN COMPARISON:  07/18/2021 FINDINGS: Lower chest: Dense mitral valve calcification and mild aortic valve calcification. Descending thoracic aortic and right coronary artery atherosclerotic calcification. Upper normal heart size. Small inferior pericardial effusion. Small type 1 hiatal hernia. 6 by 4 by 3 mm (volume = 40 mm^3) right lower lobe nodule on image 10 series 4 appears unchanged. Hepatobiliary: Unremarkable Pancreas: Unremarkable Spleen: Unremarkable Adrenals/Urinary Tract: 8 by 7 mm hypodense lesion in the left mid kidney medially is technically too small to characterize although statistically probably a cyst. Adrenal glands  unremarkable. Stomach/Bowel: Small type 1 hiatal hernia. Gastric wall thickening probably secondary to nondistention. There is formed stool in the distal colon. Anastomotic staple line in the sigmoid colon. Scattered air-fluid levels in nondilated small bowel. Vascular/Lymphatic: Atherosclerosis is present, including aortoiliac atherosclerotic disease. Substantial atheromatous plaque proximally in the celiac trunk and SMA. Reproductive: Linear bandlike scarring in the pubis extending above the penis at the site of the prior fluid collection. Central prostate gland calcifications. Other: No supplemental non-categorized findings. Musculoskeletal: Upper abdominal ventral hernia containing adipose tissue, image 28 series 2. Abnormal and potential loculated fluid collection measuring about 3.0 by 2.8 by 2.3 cm (volume = 10 cm^3) in the right rectus abdominus muscle, image 38 series 6. Abnormal appearance of the left rectus abdominus muscle including a 4.1 by 0.8 by 2.3 cm (volume = 4 cm^3) low-density collection with enhancing margins, and some increase thickening/nodularity in the subcutaneous tissues overlying the left rectus abdominus for example on image 43 of series 2 which represents change from the prior exam. There is some subcutaneous edema overlying both of the rectus abdominus muscles in the regions of abnormal nodular thickening. Spondylosis and degenerative disc disease at all lumbar levels with foraminal impingement most notable bilaterally at L4-5 and to a lesser degree at L3-4. IMPRESSION: 1. Abnormal fluid collections in both rectus abdominus muscles with overlying subcutaneous edema. The appearance could represent abscesses or possibly hematomas. 2. Scarring at the site of prior drained abscess along the pubis. 3. Small upper abdominal ventral midline hernia containing adipose tissue. 4. Stable 40 cubic mm right lower lobe pulmonary nodule. No follow-up needed if patient is low-risk.This recommendation  follows the consensus statement: Guidelines for Management of Incidental Pulmonary Nodules Detected on CT Images: From the Fleischner Society 2017; Radiology 2017; 284:228-243. 5. Other imaging findings of potential clinical significance: Dense mitral valve and mild aortic valve calcifications. Aortic Atherosclerosis (ICD10-I70.0). Systemic atherosclerosis including plaque in the proximal SMA and celiac trunk. Small inferior pericardial effusion. Small type 1 hiatal hernia. Scattered nonspecific air-levels in nondilated small bowel. Multilevel lower lumbar impingement. Electronically Signed   By: WVan ClinesM.D.   On: 11/15/2021 11:38   DG Chest Port 1 View  Result Date: 11/15/2021 CLINICAL DATA:  Abdominal pain EXAM: PORTABLE CHEST 1 VIEW COMPARISON:  None. FINDINGS: Transverse diameter of heart is slightly increased. Thoracic aorta is tortuous. Lung fields are clear of any infiltrates or pulmonary edema. There is no pleural effusion or pneumothorax. IMPRESSION: There are no signs of pulmonary edema or focal pulmonary consolidation. Electronically Signed   By: PElmer PickerM.D.   On: 11/15/2021 10:57    Microbiology: Results for orders placed or performed during the hospital encounter of 11/15/21  Resp Panel by RT-PCR (Flu A&B, Covid) Nasopharyngeal Swab     Status: None   Collection Time: 11/15/21 10:50 AM   Specimen: Nasopharyngeal Swab; Nasopharyngeal(NP)  swabs in vial transport medium  Result Value Ref Range Status   SARS Coronavirus 2 by RT PCR NEGATIVE NEGATIVE Final    Comment: (NOTE) SARS-CoV-2 target nucleic acids are NOT DETECTED.  The SARS-CoV-2 RNA is generally detectable in upper respiratory specimens during the acute phase of infection. The lowest concentration of SARS-CoV-2 viral copies this assay can detect is 138 copies/mL. A negative result does not preclude SARS-Cov-2 infection and should not be used as the sole basis for treatment or other patient management  decisions. A negative result may occur with  improper specimen collection/handling, submission of specimen other than nasopharyngeal swab, presence of viral mutation(s) within the areas targeted by this assay, and inadequate number of viral copies(<138 copies/mL). A negative result must be combined with clinical observations, patient history, and epidemiological information. The expected result is Negative.  Fact Sheet for Patients:  EntrepreneurPulse.com.au  Fact Sheet for Healthcare Providers:  IncredibleEmployment.be  This test is no t yet approved or cleared by the [place] FDA and  has been authorized for detection and/or diagnosis of SARS-CoV-2 by FDA under an Emergency Use Authorization (EUA). This EUA will remain  in effect (meaning this test can be used) for the duration of the COVID-19 declaration under Section 564(b)(1) of the Act, 21 U.S.C.section 360bbb-3(b)(1), unless the authorization is terminated  or revoked sooner.       Influenza A by PCR NEGATIVE NEGATIVE Final   Influenza B by PCR NEGATIVE NEGATIVE Final    Comment: (NOTE) The Xpert Xpress SARS-CoV-2/FLU/RSV plus assay is intended as an aid in the diagnosis of influenza from Nasopharyngeal swab specimens and should not be used as a sole basis for treatment. Nasal washings and aspirates are unacceptable for Xpert Xpress SARS-CoV-2/FLU/RSV testing.  Fact Sheet for Patients: EntrepreneurPulse.com.au  Fact Sheet for Healthcare Providers: IncredibleEmployment.be  This test is not yet approved or cleared by the [place] FDA and has been authorized for detection and/or diagnosis of SARS-CoV-2 by FDA under an Emergency Use Authorization (EUA). This EUA will remain in effect (meaning this test can be used) for the duration of the COVID-19 declaration under Section 564(b)(1) of the Act, 21 U.S.C. section 360bbb-3(b)(1), unless the  authorization is terminated or revoked.  Performed at [facility], [address] [place], [place] [address]   Blood culture (routine x 2)     Status: None (Preliminary result)   Collection Time: 11/15/21  2:23 PM   Specimen: BLOOD LEFT HAND  Result Value Ref Range Status   Specimen Description   Final    BLOOD LEFT HAND Performed at [facility], [address] [place], [place] [address]    Special Requests   Final    BOTTLES DRAWN AEROBIC AND ANAEROBIC Blood Culture adequate volume Performed at [facility], [address], [place], [place] [address]    Culture   Final    NO GROWTH 4 DAYS Performed at [facility], [place] [address], [place], [place] [address]    Report Status PENDING  Incomplete  Blood culture (routine x 2)     Status: None (Preliminary result)   Collection Time: 11/15/21  2:29 PM   Specimen: Right Antecubital; Blood  Result Value Ref Range Status   Specimen Description   Final    RIGHT ANTECUBITAL Performed at [facility], [address], [place], [place] [address]    Special Requests   Final    BOTTLES DRAWN AEROBIC AND ANAEROBIC Blood Culture adequate volume  Performed at [facility], [address] [place], [place] [address]    Culture   Final    NO GROWTH 4 DAYS Performed at [facility], [place] [address], [place], [place] [address]    Report Status PENDING  Incomplete  Aerobic/Anaerobic Culture w Gram Stain (surgical/deep wound)     Status: None (Preliminary result)   Collection Time: 11/16/21  4:56 PM   Specimen: Abscess  Result Value Ref Range Status   Specimen Description   Final    ABSCESS Performed at [facility] [address], [place], [place] [address]    Special Requests   Final    LEFT ABDOMINAL WALL Performed at [facility], [place] [address] [place], [place] [address]    Gram  Stain   Final    NO SQUAMOUS EPITHELIAL CELLS SEEN ABUNDANT WBC SEEN MODERATE GRAM POSITIVE COCCI Performed at [facility], [place] [address], [place], [place] [address]    Culture   Final    ABUNDANT METHICILLIN RESISTANT STAPHYLOCOCCUS AUREUS NO ANAEROBES ISOLATED; CULTURE IN PROGRESS FOR 5 DAYS    Report Status PENDING  Incomplete   Organism ID, Bacteria METHICILLIN RESISTANT STAPHYLOCOCCUS AUREUS  Final      Susceptibility   Methicillin resistant staphylococcus aureus - MIC*    CIPROFLOXACIN >=8 RESISTANT Resistant     ERYTHROMYCIN <=0.25 SENSITIVE Sensitive     GENTAMICIN <=0.5 SENSITIVE Sensitive     OXACILLIN >=4 RESISTANT Resistant     TETRACYCLINE <=1 SENSITIVE Sensitive     VANCOMYCIN <=0.5 SENSITIVE Sensitive     TRIMETH/SULFA >=320 RESISTANT Resistant     CLINDAMYCIN <=0.25 SENSITIVE Sensitive     RIFAMPIN <=0.5 SENSITIVE Sensitive     Inducible Clindamycin NEGATIVE Sensitive     * ABUNDANT METHICILLIN RESISTANT STAPHYLOCOCCUS AUREUS  Aerobic/Anaerobic Culture w Gram Stain (surgical/deep wound)     Status: None (Preliminary result)   Collection Time: 11/16/21  4:56 PM   Specimen: Abscess  Result Value Ref Range Status   Specimen Description   Final    ABSCESS Performed at [facility] [address] [place], [place] [address]    Special Requests   Final    RIGHT ABDOMINAL WALL Performed at [facility], [place] [address] [place], [place] [address]    Gram Stain   Final    NO SQUAMOUS EPITHELIAL CELLS SEEN ABUNDANT WBC SEEN FEW GRAM POSITIVE COCCI Performed at [facility], [place] [address] [place], [place] [address]    Culture   Final    ABUNDANT STREPTOCOCCUS INTERMEDIUS NO ANAEROBES ISOLATED; CULTURE IN PROGRESS FOR 5 DAYS    Report Status PENDING  Incomplete    Labs: CBC: Recent Labs  Lab 11/15/21 0950 11/16/21 0330 11/17/21 0319 11/18/21 0319  WBC 16.7* 16.8* 12.6* 9.5  HGB 12.0* 10.9* 10.3*  10.0*  HCT 37.0* 34.4* 32.5* 31.2*  MCV 85.8 89.4 88.3 87.4  PLT 281 238 227 638   Basic Metabolic Panel: Recent Labs  Lab 11/15/21 0950 11/16/21 0330 11/17/21 0319 11/18/21 0319  NA 139 135 137 135  K 4.2 3.6 3.9 3.6  CL 104 102 106 102  CO2 '23 22 24 24  '$ GLUCOSE 164* 133* 119* 135*  BUN 26* 27* 29* 23  CREATININE 1.38* 1.46* 1.36* 1.05  CALCIUM 10.3 9.1 9.2 9.2   Liver Function Tests: Recent Labs  Lab 11/15/21 0950  AST 11*  ALT 12  ALKPHOS 98  BILITOT 0.6  PROT 7.8  ALBUMIN 4.4   CBG: No results for input(s): GLUCAP in the last 168 hours.  Discharge time spent: greater than 30 minutes.  Signed: Elmarie Shiley, MD [facility] 11/19/2021

## 2021-11-19 NOTE — Progress Notes (Signed)
Supervising Physician: Markus Daft  Patient Status:  [facility] - In-pt  Chief Complaint:  Abdominal wall abscess  Subjective:  Feeling good, feels ready to go home, would like to have drain pulled.  Allergies: Amlodipine, Amlodipine besylate, Nebivolol hcl, Statins, and Latex  Medications: Prior to Admission medications   Medication Sig Start Date End Date Taking? Authorizing Provider  aspirin EC 81 MG tablet Take 81 mg by mouth daily. Swallow whole.   Yes [provider]  Azelastine HCl 137 MCG/SPRAY SOLN    Yes [provider]  chlorthalidone (HYGROTON) 25 MG tablet Take 25 mg by mouth every morning.   Yes [provider]  ezetimibe (ZETIA) 10 MG tablet Take 1 tablet (10 mg total) by mouth daily. 09/24/21  Yes Shafer, Marcelino Scot, NP  fluticasone (FLONASE) 50 MCG/ACT nasal spray Place 2 sprays into both nostrils 2 (two) times daily as needed for allergies or rhinitis.    Yes [provider]  ibuprofen (ADVIL) 800 MG tablet Take 800 mg by mouth 2 (two) times daily as needed for headache or moderate pain. 04/10/21  Yes [provider]  Astrid Drafts 1000 MG CAPS    Yes [provider]  levETIRAcetam (KEPPRA) 500 MG tablet Take 1 tablet (500 mg total) by mouth 2 (two) times daily. 10/03/21  Yes Pokhrel, Laxman, MD  lisinopril (PRINIVIL,ZESTRIL) 40 MG tablet Take 40 mg by mouth every morning.   Yes [provider]  Multiple Vitamin (MULTIVITAMIN) tablet Take 1 tablet by mouth daily.   Yes [provider]  omeprazole (PRILOSEC) 40 MG capsule Take 40 mg by mouth daily.   Yes [provider]  Polyvinyl Alcohol-Povidone PF (REFRESH) 1.4-0.6 % SOLN Place 1 drop into both eyes daily as needed (dry eyes).   Yes [provider]  tadalafil (CIALIS) 20 MG tablet Take 20 mg by mouth once as needed (very high blood pressure with fluctuations).   Yes [provider]  tamsulosin (FLOMAX) 0.4 MG CAPS capsule Take 0.4  mg by mouth daily. Takes at 2100   Yes [provider]  HYDROcodone-acetaminophen (NORCO/VICODIN) 5-325 MG tablet Take 1 tablet by mouth every 8 (eight) hours as needed for pain. Patient not taking: Reported on 11/15/2021 09/20/21   [provider]     Vital Signs: BP 138/71 (BP Location: Right Arm)    Pulse 67    Temp 97.9 F (36.6 C) (Oral)    Resp 16    Ht '5\' 8"'$  (1.727 m)    Wt 176 lb 3.2 oz (79.9 kg)    SpO2 99%    BMI 26.79 kg/m   Physical Exam Vitals reviewed.  Constitutional:      General: He is not in acute distress.    Appearance: He is not ill-appearing.  HENT:     Head: Normocephalic and atraumatic.     Mouth/Throat:     Mouth: Mucous membranes are moist.     Pharynx: Oropharynx is clear.  Eyes:     Extraocular Movements: Extraocular movements intact.  Cardiovascular:     Rate and Rhythm: Normal rate.  Pulmonary:     Effort: Pulmonary effort is normal. No respiratory distress.  Abdominal:     Comments: Redness and induration remain around drainage catheter,  otherwise belly is flat and soft  Skin:    General: Skin is warm and dry.  Neurological:     General: No focal deficit present.     Mental Status: He is alert and oriented  to person, place, and time.  Psychiatric:        Mood and Affect: Mood normal.        Behavior: Behavior normal.  Drain Location: RLQ Size: Fr size: 10 Fr Date of placement: 11/16/21  Currently to: Drain collection device: suction bulb 24 hour output:  Output by Drain (mL) 11/17/21 0701 - 11/17/21 1900 11/17/21 1901 - 11/18/21 0700 11/18/21 0701 - 11/18/21 1900 11/18/21 1901 - 11/19/21 0700 11/19/21 0701 - 11/19/21 1103  Closed System Drain Right RLQ Bulb (JP) '10 10 10 10     '$ Current examination: Flushes/aspirates easily.  Insertion site unremarkable. Suture and stat lock in place. Dressed appropriately.  OP is clear and pink tinged       Imaging: [place] Abscess Drain  Result Date: 11/16/2021 INDICATION: 76 year old  with pain and redness in the right anterior abdominal wall and concern for infection involving the rectus sheath. There is also a complex fluid collection in the left anterior abdominal wall. EXAM: 1. Ultrasound-guided aspiration and drain placement in the right abdominal wall abscess 2. Ultrasound-guided aspiration of left anterior abdominal wall fluid collection MEDICATIONS: Moderate sedation ANESTHESIA/SEDATION: Moderate (conscious) sedation was employed during this procedure. A total of Versed 3.'0mg'$  and fentanyl 100 mcg was administered intravenously at the order of the provider performing the procedure. Total intra-service moderate sedation time: 37 minutes. Patient's level of consciousness and vital signs were monitored continuously by radiology nurse throughout the procedure under the supervision of the provider performing the procedure. COMPLICATIONS: None immediate. PROCEDURE: Informed written consent was obtained from the patient after a thorough discussion of the procedural risks, benefits and alternatives. All questions were addressed. A timeout was performed prior to the initiation of the procedure. Anterior abdominal wall was evaluated with ultrasound. The right side of the abdomen was prepped with chlorhexidine and sterile field was created. Maximal barrier sterile technique was utilized including caps, mask, sterile gowns, sterile gloves, sterile drape, hand hygiene and skin antiseptic. Skin was anesthetized with 1% lidocaine. Small incision was made. Using ultrasound guidance, Yueh catheter was directed into the collection and pink purulent fluid was aspirated. Superstiff Amplatz wire was advanced into the collection and the tract was dilated to accommodate a 10 [place] multipurpose drain. Approximately 7 mL of purulent fluid was removed. Catheter was sutured to skin and attached to suction bulb. Attention was directed to the left anterior abdominal fluid. A new sterile field was created. Left  anterior abdomen was prepped and draped in sterile fashion. Skin was anesthetized with 1% lidocaine. Using ultrasound guidance, a spinal needle was directed into the collection but very little fluid could be aspirated. Therefore, a Yueh catheter was directed into collection using ultrasound guidance and approximately 1 mL purulent looking fluid was removed. Needle was repositioned but no additional fluid could be aspirated. Bandages placed. FINDINGS: Complex fluid collection along the right anterior abdominal wall and rectus sheath. This collection measured 5.0 x 1.8 x 3.2 cm. Purulent fluid was removed from this collection and a 10 French drain was placed. This collection was decompressed at the end of the procedure. Left anterior abdominal collection is complex and there appears to be surgical mesh material adjacent to this complex fluid. Despite having a Yueh catheter well positioned in the collection, only a small amount of thick fluid could be aspirated. IMPRESSION: 1. Ultrasound-guided placement of a drainage catheter in the small abscess collection in the right anterior abdominal wall involving the rectus sheath. 7 mL of purulent fluid was removed and  fluid was sent for culture. 2. Complex fluid collection along the left anterior abdominal wall which appears to be a associated with surgical mesh material. Only 1 mL of thick fluid could be aspirated and the collection was NOT decompressed at the end of the procedure. Electronically Signed   By: Markus Daft M.D.   On: 11/16/2021 17:42   [place] Abscess Drain  Result Date: 11/16/2021 INDICATION: 76 year old with pain and redness in the right anterior abdominal wall and concern for infection involving the rectus sheath. There is also a complex fluid collection in the left anterior abdominal wall. EXAM: 1. Ultrasound-guided aspiration and drain placement in the right abdominal wall abscess 2. Ultrasound-guided aspiration of left anterior abdominal wall fluid  collection MEDICATIONS: Moderate sedation ANESTHESIA/SEDATION: Moderate (conscious) sedation was employed during this procedure. A total of Versed 3.'0mg'$  and fentanyl 100 mcg was administered intravenously at the order of the provider performing the procedure. Total intra-service moderate sedation time: 37 minutes. Patient's level of consciousness and vital signs were monitored continuously by radiology nurse throughout the procedure under the supervision of the provider performing the procedure. COMPLICATIONS: None immediate. PROCEDURE: Informed written consent was obtained from the patient after a thorough discussion of the procedural risks, benefits and alternatives. All questions were addressed. A timeout was performed prior to the initiation of the procedure. Anterior abdominal wall was evaluated with ultrasound. The right side of the abdomen was prepped with chlorhexidine and sterile field was created. Maximal barrier sterile technique was utilized including caps, mask, sterile gowns, sterile gloves, sterile drape, hand hygiene and skin antiseptic. Skin was anesthetized with 1% lidocaine. Small incision was made. Using ultrasound guidance, Yueh catheter was directed into the collection and pink purulent fluid was aspirated. Superstiff Amplatz wire was advanced into the collection and the tract was dilated to accommodate a 10 [place] multipurpose drain. Approximately 7 mL of purulent fluid was removed. Catheter was sutured to skin and attached to suction bulb. Attention was directed to the left anterior abdominal fluid. A new sterile field was created. Left anterior abdomen was prepped and draped in sterile fashion. Skin was anesthetized with 1% lidocaine. Using ultrasound guidance, a spinal needle was directed into the collection but very little fluid could be aspirated. Therefore, a Yueh catheter was directed into collection using ultrasound guidance and approximately 1 mL purulent looking fluid was removed.  Needle was repositioned but no additional fluid could be aspirated. Bandages placed. FINDINGS: Complex fluid collection along the right anterior abdominal wall and rectus sheath. This collection measured 5.0 x 1.8 x 3.2 cm. Purulent fluid was removed from this collection and a 10 French drain was placed. This collection was decompressed at the end of the procedure. Left anterior abdominal collection is complex and there appears to be surgical mesh material adjacent to this complex fluid. Despite having a Yueh catheter well positioned in the collection, only a small amount of thick fluid could be aspirated. IMPRESSION: 1. Ultrasound-guided placement of a drainage catheter in the small abscess collection in the right anterior abdominal wall involving the rectus sheath. 7 mL of purulent fluid was removed and fluid was sent for culture. 2. Complex fluid collection along the left anterior abdominal wall which appears to be a associated with surgical mesh material. Only 1 mL of thick fluid could be aspirated and the collection was NOT decompressed at the end of the procedure. Electronically Signed   By: Markus Daft M.D.   On: 11/16/2021 17:42   CT ABDOMEN PELVIS W CONTRAST  Result Date: 11/15/2021 CLINICAL DATA:  Right lower quadrant abdominal pain swelling for 2.5 days EXAM: CT ABDOMEN AND PELVIS WITH CONTRAST TECHNIQUE: Multidetector CT imaging of the abdomen and pelvis was performed using the standard protocol following bolus administration of intravenous contrast. RADIATION DOSE REDUCTION: This exam was performed according to the departmental dose-optimization program which includes automated exposure control, adjustment of the mA and/or kV according to patient size and/or use of iterative reconstruction technique. CONTRAST:  12m OMNIPAQUE IOHEXOL 300 MG/ML  SOLN COMPARISON:  07/18/2021 FINDINGS: Lower chest: Dense mitral valve calcification and mild aortic valve calcification. Descending thoracic aortic and right  coronary artery atherosclerotic calcification. Upper normal heart size. Small inferior pericardial effusion. Small type 1 hiatal hernia. 6 by 4 by 3 mm (volume = 40 mm^3) right lower lobe nodule on image 10 series 4 appears unchanged. Hepatobiliary: Unremarkable Pancreas: Unremarkable Spleen: Unremarkable Adrenals/Urinary Tract: 8 by 7 mm hypodense lesion in the left mid kidney medially is technically too small to characterize although statistically probably a cyst. Adrenal glands unremarkable. Stomach/Bowel: Small type 1 hiatal hernia. Gastric wall thickening probably secondary to nondistention. There is formed stool in the distal colon. Anastomotic staple line in the sigmoid colon. Scattered air-fluid levels in nondilated small bowel. Vascular/Lymphatic: Atherosclerosis is present, including aortoiliac atherosclerotic disease. Substantial atheromatous plaque proximally in the celiac trunk and SMA. Reproductive: Linear bandlike scarring in the pubis extending above the penis at the site of the prior fluid collection. Central prostate gland calcifications. Other: No supplemental non-categorized findings. Musculoskeletal: Upper abdominal ventral hernia containing adipose tissue, image 28 series 2. Abnormal and potential loculated fluid collection measuring about 3.0 by 2.8 by 2.3 cm (volume = 10 cm^3) in the right rectus abdominus muscle, image 38 series 6. Abnormal appearance of the left rectus abdominus muscle including a 4.1 by 0.8 by 2.3 cm (volume = 4 cm^3) low-density collection with enhancing margins, and some increase thickening/nodularity in the subcutaneous tissues overlying the left rectus abdominus for example on image 43 of series 2 which represents change from the prior exam. There is some subcutaneous edema overlying both of the rectus abdominus muscles in the regions of abnormal nodular thickening. Spondylosis and degenerative disc disease at all lumbar levels with foraminal impingement most notable  bilaterally at L4-5 and to a lesser degree at L3-4. IMPRESSION: 1. Abnormal fluid collections in both rectus abdominus muscles with overlying subcutaneous edema. The appearance could represent abscesses or possibly hematomas. 2. Scarring at the site of prior drained abscess along the pubis. 3. Small upper abdominal ventral midline hernia containing adipose tissue. 4. Stable 40 cubic mm right lower lobe pulmonary nodule. No follow-up needed if patient is low-risk.This recommendation follows the consensus statement: Guidelines for Management of Incidental Pulmonary Nodules Detected on CT Images: From the Fleischner Society 2017; Radiology 2017; 284:228-243. 5. Other imaging findings of potential clinical significance: Dense mitral valve and mild aortic valve calcifications. Aortic Atherosclerosis (ICD10-I70.0). Systemic atherosclerosis including plaque in the proximal SMA and celiac trunk. Small inferior pericardial effusion. Small type 1 hiatal hernia. Scattered nonspecific air-levels in nondilated small bowel. Multilevel lower lumbar impingement. Electronically Signed   By: WVan ClinesM.D.   On: 11/15/2021 11:38    Labs:  CBC: Recent Labs    11/15/21 0950 11/16/21 0330 11/17/21 0319 11/18/21 0319  WBC 16.7* 16.8* 12.6* 9.5  HGB 12.0* 10.9* 10.3* 10.0*  HCT 37.0* 34.4* 32.5* 31.2*  PLT 281 238 227 236    COAGS: Recent Labs    09/21/21 1924  10/02/21 1740  INR 0.9 0.9  APTT 29 29    BMP: Recent Labs    11/15/21 0950 11/16/21 0330 11/17/21 0319 11/18/21 0319  NA 139 135 137 135  K 4.2 3.6 3.9 3.6  CL 104 102 106 102  CO2 '23 22 24 24  '$ GLUCOSE 164* 133* 119* 135*  BUN 26* 27* 29* 23  CALCIUM 10.3 9.1 9.2 9.2  CREATININE 1.38* 1.46* 1.36* 1.05  GFRNONAA 53* 50* 54* >60    LIVER FUNCTION TESTS: Recent Labs    07/18/21 1221 09/21/21 1924 10/02/21 1740 11/15/21 0950  BILITOT 0.7 0.4 0.4 0.6  AST 14* 22 18 11*  ALT '15 24 18 12  '$ ALKPHOS 73 87 96 98  PROT 7.1 6.5 7.1  7.8  ALBUMIN 4.0 3.5 3.8 4.4    Assessment and Plan: Abdominal wall abscess S/p drain placement   Plan: Continue TID flushes with 5 cc NS. Record output Q shift. Dressing changes QD or PRN if soiled.  Call IR APP or on call IR MD if difficulty flushing or sudden change in drain output.  Repeat imaging/possible drain injection once output < 10 mL/QD (excluding flush material.)  Discharge planning: Will place discharge orders and instructions so they are in place when patient is ready to go. Typically patient will follow up with IR clinic 10-14 days post d/c for repeat imaging/possible drain injection. IR scheduler will contact patient with date/time of appointment. Patient will need to flush drain QD with 5 cc NS, record output QD, dressing changes every 2-3 days or earlier if soiled.   IR will continue to follow - please call with questions or concerns.   Electronically Signed: Pasty Spillers, PA 11/19/2021, 10:58 AM   I spent a total of 25 Minutes at the the patient's bedside AND on the patient's hospital floor or unit, greater than 50% of which was counseling/coordinating care for abdominal wall abscess drain.

## 2021-11-20 ENCOUNTER — Other Ambulatory Visit: Payer: Self-pay | Admitting: Internal Medicine

## 2021-11-20 LAB — CULTURE, BLOOD (ROUTINE X 2)
Culture: NO GROWTH
Culture: NO GROWTH
Special Requests: ADEQUATE
Special Requests: ADEQUATE

## 2021-11-20 MED ORDER — DOXYCYCLINE HYCLATE 100 MG PO TABS: 100.0000 mg | ORAL_TABLET | Freq: Two times a day (BID) | ORAL | 0 refills | Status: DC

## 2021-11-20 MED ORDER — CEFADROXIL 500 MG PO CAPS: 1000.0000 mg | ORAL_CAPSULE | Freq: Two times a day (BID) | ORAL | 0 refills | Status: DC

## 2021-11-20 NOTE — Progress Notes (Signed)
Dr Linus Salmons received called, [facility] didn't received prescription for antibiotics yesterday. I will send prescription again.  ?

## 2021-11-21 ENCOUNTER — Other Ambulatory Visit (HOSPITAL_COMMUNITY): Payer: Self-pay

## 2021-11-21 ENCOUNTER — Other Ambulatory Visit: Payer: Self-pay | Admitting: Surgery

## 2021-11-21 ENCOUNTER — Encounter: Payer: Self-pay | Admitting: *Deleted

## 2021-11-21 ENCOUNTER — Other Ambulatory Visit: Payer: Self-pay | Admitting: *Deleted

## 2021-11-21 DIAGNOSIS — L02211 Cutaneous abscess of abdominal wall: Secondary | ICD-10-CM

## 2021-11-21 LAB — AEROBIC/ANAEROBIC CULTURE W GRAM STAIN (SURGICAL/DEEP WOUND): Gram Stain: NONE SEEN

## 2021-11-21 NOTE — Patient Outreach (Signed)
[facility]) Austin Management Telephonic Richard Austin Austin Manager Note   11/21/2021 Name:  Richard Austin MRN:  [id number] DOB:  Sep 13, 1945  Summary: Incoming outreaches x 2 from Richard Austin to include one to Richard Austin, Richard Austin office (wife wanting patient experience outreach)  Updated from hospital liaison of his discharge home without home Return call to Richard Austin -He confirms his discharge home without home health but informs Richard Austin CM he is capable of completing the dressing changes and Austin to his abdominal wound/drain site  He voices concern with his neurological hospital follow appointment now being changed to April 2023. Previous Neurology appointments had been cancelled and rescheduled since January 2023. He was hospitalized in January 2023. He cancelled a 10/03/21 neuro appointment. A 10/31/21 neuro appointment was changed by the neuro office. He was scheduled to be seen by Dr Pennie Rushing on 11/16/21 but was hospitalized 3/7-11/23 for abdominal pain, abdominal wall and rectus sheath abscesses   He confirms his wife did have her concerns addressed as she was able to speak with administrators about medication concerns (IT)  Recommendations/Changes made from today's visit: Assessed for needs, concerns, allowed patient time to ventilate his feeling  Outreach to [facility] neurology, spoke with Richard Austin about Richard Austin outreach to Richard Austin CM and his need to have a follow up neuro appointment. Chart review. Richard Austin spoke with staff at the office, cancelled the appointment with neurology NP and sent note to Dr Pearlean Brownie and/or Richard Austin to assist with possibly getting pt seen in 1-2 weeks.  Richard Austin updated He agrees to follow up with Richard Austin CM prn   Subjective: Richard Austin is an 76 y.o. year old male who is a primary patient of Richard Reichmann, Richard Austin. The Austin management team was consulted for assistance with Austin management and/or Austin coordination needs.    Telephonic Richard Austin Austin Manager completed Telephone Visit today.    Objective:  Medications Reviewed Today     Reviewed by Richard Larry, Richard Austin (Registered Nurse) on 11/17/21 at [id number]  Med List Status: Complete   Medication Order Taking? Sig Documenting Provider Last Dose Status Informant  aspirin EC 81 MG tablet [id number] Yes Take 81 mg by mouth daily. Swallow whole. [provider] 11/14/2021 Active Spouse/Significant Other, Self  Azelastine HCl 137 MCG/SPRAY SOLN [id number] Yes  [provider] Past Month Active Self  chlorthalidone (HYGROTON) 25 MG tablet [id number] Yes Take 25 mg by mouth every morning. [provider] 11/14/2021 Active Spouse/Significant Other, Self  ezetimibe (ZETIA) 10 MG tablet [id number] Yes Take 1 tablet (10 mg total) by mouth daily. Elmer Picker, NP 11/14/2021 Active Spouse/Significant Other, Self  fluticasone (FLONASE) 50 MCG/ACT nasal spray [id number] Yes Place 2 sprays into both nostrils 2 (two) times daily as needed for allergies or rhinitis.  [provider] Past Month Active Spouse/Significant Other, Self  HYDROcodone-acetaminophen (NORCO/VICODIN) 5-325 MG tablet [id number] No Take 1 tablet by mouth every 8 (eight) hours as needed for pain.  Patient not taking: Reported on 11/15/2021   [provider] Completed Course Active Spouse/Significant Other, Self  ibuprofen (ADVIL) 800 MG tablet [id number] Yes Take 800 mg by mouth 2 (two) times daily as needed for headache or moderate pain. [provider] 11/14/2021 Active Spouse/Significant Other, Self  [place] Lan[place] 1000 MG CAPS [id number] Yes  [provider] 11/14/2021 Active Self  levETIRAcetam (KEPPRA) 500 MG tablet [id number] Yes Take 1 tablet (500 mg total) by mouth 2 (two) times daily. Joycelyn Das, MD 11/14/2021 Active Self  lisinopril (PRINIVIL,ZESTRIL) 40  MG tablet [id number] Yes Take 40 mg by mouth every morning. [provider] 11/14/2021 Active Spouse/Significant Other, Self  Multiple Vitamin (MULTIVITAMIN) tablet  [id number] Yes Take 1 tablet by mouth daily. [provider] 11/14/2021 Active Spouse/Significant Other, Self  omeprazole (PRILOSEC) 40 MG capsule [id number] Yes Take 40 mg by mouth daily. [provider] 11/14/2021 Active Spouse/Significant Other, Self  Polyvinyl Alcohol-Povidone PF (REFRESH) 1.4-0.6 % SOLN [id number] Yes Place 1 drop into both eyes daily as needed (dry eyes). [provider] Past Week Active Spouse/Significant Other, Self  tadalafil (CIALIS) 20 MG tablet [id number] Yes Take 20 mg by mouth once as needed (very high blood pressure with fluctuations). [provider]  Active Spouse/Significant Other, Self  tamsulosin (FLOMAX) 0.4 MG CAPS capsule [id number] Yes Take 0.4 mg by mouth daily. Takes at 2100 [provider] 11/14/2021 Active Spouse/Significant Other, Self             SDOH:  (Social Determinants of Health) assessments and interventions performed:  SDOH Interventions    Flowsheet Row Most Recent Value  SDOH Interventions   Food Insecurity Interventions Intervention Not Indicated  Housing Interventions Intervention Not Indicated  Intimate Partner Violence Interventions Intervention Not Indicated  Transportation Interventions Intervention Not Indicated       Austin Plan  Review of patient past medical history, allergies, medications, health status, including review of consultants reports, laboratory and other test data, was performed as part of comprehensive evaluation for Austin management services.   Austin Plan : Richard Austin Austin Manager Plan of Austin  Updates made by Richard Gallant, Richard Austin since 11/21/2021 12:00 AM     Problem: Complex Austin Coordination Needs and disease management in patient with stroke, HTN, DM   Priority: High  Onset Date: 09/26/2021     Long-Range Goal: Establish Plan of Austin for Management Complex SDOH Barriers, disease management and Austin Coordination Needs in patient with stroke, HTN, DM   Start Date: 09/26/2021   This Visit's Progress: On track  Recent Progress: On track  Priority: High  Note:   Current Barriers:  Knowledge Deficits related to plan of Austin for management of HTN, DMII, and stroke  Austin Coordination needs related to Limited education about stroke, HTN, DM II HTN* EMMI stroke red alerts x 2 for appointments (09/26/21 & 10/10/21) resolved 10/10/21 09/21/21 to 09/23/21 admission,  11/15/21 admission for abdominal abscess with surgery on 11/16/21 -discharge 11/19/21 abdominal wound drain  Richard Austin CM Clinical Goal(s):  Patient will verbalize understanding of plan for management of HTN, DMII, and stroke as evidenced by voiced improved BP, HgA1c,decrease risk for re admission in next 30 business days  through collaboration with Medical illustrator, provider, and Austin team. - Goal met on 10/24/21 but revised with re admission on 11/15/21  Interventions: Further outreach for Austin coordination, disease management/education Inter-disciplinary Austin team collaboration (see longitudinal plan of Austin) Evaluation of current treatment plan related to  self management and patient's adherence to plan as established by provider 10/10/21 Assessed for concerns with scheduling appointments per new Friday 10/07/21 10 am EMMI stroke red alert referral received today 09/30/21 Denies other needs at this time 11/21/21 Outreach to Richard neurology, spoke with Richard Austin about Richard Austin outreach to Richard Austin CM and his need to have a follow up neuro appointment. Chart review. Richard Austin spoke with staff at the office, cancelled the appointment with neurology NP and sent note to Dr Pearlean Brownie and/or Richard Austin to assist with possibly getting pt seen in 1-2 weeks.  Richard Austin updated  He agrees to follow up with Richard Austin CM prn    Diabetes Interventions:  (Status:  Goal on track:  Yes.) Short Term Goal Assessed patient's understanding of A1c goal: <7% Discussed plans with patient for ongoing Austin management follow up and provided patient with direct contact information for  Austin management team Lab Results  Component Value Date   HGBA1C 6.4 (H) 09/22/2021   Hypertension Interventions:  (Status:  Condition stable.  Not addressed this visit.) Long Term Goal Last practice recorded BP readings:  BP Readings from Last 3 Encounters:  11/19/21 (!) 148/74  10/03/21 118/76  09/23/21 126/65  Most recent eGFR/CrCl: No results found for: EGFR  No components found for: CRCL  Evaluation of current treatment plan related to hypertension self management and patient's adherence to plan as established by provider  Stroke:  (Status:Goal on track:  Yes.) Long Term Goal Reviewed Importance of taking all medications as prescribed Reviewed Importance of attending all scheduled provider appointments Assessed social determinant of health barriers 11/01/21 assessed for neurology visit  Patient Goals/Self-Austin Activities: Take all medications as prescribed Attend all scheduled provider appointments Perform all self Austin activities independently  Perform IADL's (shopping, preparing meals, housekeeping, managing finances) independently Call provider office for new concerns or questions   Follow Up Plan:  The patient has been provided with contact information for the Austin management team and has been advised to call with any health related questions or concerns.  The Austin management team will reach out to the patient again over the next 30 + business  days.       Plan: The patient has been provided with contact information for the Austin management team and has been advised to call with any health related questions or concerns.  The Austin management team will reach out to the patient again over the next 30+ business days.  Mardell Suttles L. Noelle Penner, Richard Austin, BSN, CCM [facility] Richard Austin Telephonic Austin Management Austin Coordinator Office number [phone] [facility] number [phone] Fax number [phone]

## 2021-11-23 LAB — AEROBIC/ANAEROBIC CULTURE W GRAM STAIN (SURGICAL/DEEP WOUND): Gram Stain: NONE SEEN

## 2021-11-24 ENCOUNTER — Ambulatory Visit: Payer: Medicare Other | Admitting: *Deleted

## 2021-11-24 DIAGNOSIS — M65332 Trigger finger, left middle finger: Secondary | ICD-10-CM | POA: Insufficient documentation

## 2021-11-24 DIAGNOSIS — G5602 Carpal tunnel syndrome, left upper limb: Secondary | ICD-10-CM | POA: Diagnosis not present

## 2021-11-25 ENCOUNTER — Encounter: Payer: Self-pay | Admitting: Internal Medicine

## 2021-11-25 ENCOUNTER — Other Ambulatory Visit: Payer: Self-pay

## 2021-11-25 ENCOUNTER — Ambulatory Visit: Payer: Medicare Other | Admitting: Internal Medicine

## 2021-11-25 VITALS — BP 143/87 | HR 90 | Temp 98.0°F | Resp 16 | Ht 68.0 in | Wt 176.4 lb

## 2021-11-25 DIAGNOSIS — L0291 Cutaneous abscess, unspecified: Secondary | ICD-10-CM

## 2021-11-25 MED ORDER — DOXYCYCLINE HYCLATE 100 MG PO TABS: 100.0000 mg | ORAL_TABLET | Freq: Two times a day (BID) | ORAL | 5 refills | Status: DC

## 2021-11-25 MED ORDER — CEFADROXIL 500 MG PO CAPS: 1000.0000 mg | ORAL_CAPSULE | Freq: Two times a day (BID) | ORAL | 0 refills | Status: DC

## 2021-11-25 NOTE — Progress Notes (Signed)
? ?  ?Patient: Richard Austin  ?DOB: 28-Apr-1946 ?MRN: [id number] ?PCP: [name], DO  ? ? ?Chief Complaint  ?Patient presents with  ? Hospitalization Follow-up  ?  Abdominal wall abscess   ?  ? ?Patient Active Problem List  ? Diagnosis Date Noted  ? Seizure disorder ([place]) 11/16/2021  ? Abdominal pain 11/16/2021  ? Pulmonary nodule 11/16/2021  ? Normocytic anemia 11/16/2021  ? Hyperlipidemia, unspecified 11/01/2021  ? Myositis 11/01/2021  ? Palpitations 11/01/2021  ? HLD (hyperlipidemia) 11/01/2021  ? History of seizure 11/01/2021  ? Pain in joint of left shoulder 11/01/2021  ? Encounter for orthopedic follow-up care 10/06/2021  ? Seizure-like activity (Richard Austin) 10/02/2021  ? History of cerebrovascular accident (CVA) with residual deficit 10/02/2021  ? Allergic rhinitis 09/26/2021  ? Bladder cancer (Richard Austin) 09/26/2021  ? Chronic kidney disease, stage 3a (Richard Austin) 09/26/2021  ? Dyslipidemia 09/26/2021  ? Erectile dysfunction 09/26/2021  ? GERD (gastroesophageal reflux disease) 09/26/2021  ? Osteoarthritis 09/26/2021  ? Type 2 diabetes mellitus without complications ([place]) 32/20/2542  ? Right wrist pain 09/24/2021  ? Pain of left hand 09/24/2021  ? Cerebral infarction, unspecified (Richard Austin) 09/21/2021  ? Injury of left shoulder 09/19/2021  ? Abscess of groin, right 07/19/2021  ? Subcutaneous abscess 07/18/2021  ? Numbness of hand 07/13/2021  ? Spinal stenosis in cervical region 05/22/2021  ? Neck pain 03/07/2021  ? BPH (benign prostatic hyperplasia) 03/02/2020  ? Diverticulosis of large intestine 03/02/2020  ? SBO (small bowel obstruction) (Richard Austin) 07/06/2018  ? AKI (acute kidney injury) (Richard Austin) 07/06/2018  ? Hyperglycemia 07/06/2018  ? High blood pressure 07/06/2018  ? Leukocytosis 07/06/2018  ? Bladder mass 07/06/2018  ? Pericardial effusion 07/06/2018  ? Hypercalcemia 07/06/2018  ?  ? ?Subjective:  ?Richard Austin is a 76 y.o. M with PMHx as below presents for hospital follow-up. Admitted to [facility] 3/7-3/11 for rectus  muscle abscess. He was previously admitted in November, 2022 for groin abscess and underwent I&D in OR with Cx+ MRSA and Strep intermedius discharged on cefdinir x5 days. Pt states he took about 10 days of antibiotics, so unclear if MRSA was treated.  ?3/7-3/11 admission showed CT showed R and LE rectus muscle abscess.IR consulted pt underwent US guided aspiration with Cx from both sites(Cx+ strep intermedius and MRSA).  Drain placed in right rectus muscle. Per IR left rectus collection is adjacent to mesh.  General surgery consulted and pt will require reconstructive surgery for mesh and foreign body removable, not advisable in the setting of recent CVA. Initially placed on vancomycin and ceftriaxone then transitioned to cefdinir and doxycyline to complete 4 weeks of treatment.  ?Today 11/25/21:Reports he is tolerating antibiotics. Denies abdominal pain, fever, chills, N/V/D.  ?Review of Systems  ?All other systems reviewed and are negative. ? ?Past Medical History:  ?Diagnosis Date  ? Arthritis   ? Bladder tumor   ? Diverticulitis   ? Diverticulosis   ? Hypertension   ? 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 2019  ? Pre-diabetes   ? ? ?Outpatient Medications Prior to Visit  ?Medication Sig Dispense Refill  ? aspirin EC 81 MG tablet Take 81 mg by mouth daily. Swallow whole.    ? Azelastine HCl 137 MCG/SPRAY SOLN     ? cefadroxil (DURICEF) 500 MG capsule Take 2 capsules (1,000 mg total) by mouth 2 (two) times daily. 120 capsule 0  ? doxycycline (VIBRA-TABS) 100 MG tablet Take 1 tablet (100 mg total) by mouth 2 (two) times daily. [place]  tablet 0  ? ezetimibe (ZETIA) 10 MG tablet Take 1 tablet (10 mg total) by mouth daily. 30 tablet 1  ? fluticasone (FLONASE) 50 MCG/ACT nasal spray Place 2 sprays into both nostrils 2 (two) times daily as needed for allergies or rhinitis.     ? levETIRAcetam (KEPPRA) 500 MG tablet Take 1 tablet (500 mg total) by mouth 2 (two) times daily. 60 tablet 2  ? lisinopril (PRINIVIL,ZESTRIL) 40 MG  tablet Take 40 mg by mouth every morning.    ? Multiple Vitamin (MULTIVITAMIN) tablet Take 1 tablet by mouth daily.    ? omeprazole (PRILOSEC) 40 MG capsule Take 40 mg by mouth daily.    ? Polyvinyl Alcohol-Povidone PF (REFRESH) 1.4-0.6 % SOLN Place 1 drop into both eyes daily as needed (dry eyes).    ? Sodium Chloride Flush (NORMAL SALINE FLUSH) 0.9 % SOLN Flush 3 - 5 mls via catheter daily for 20 days 200 mL 0  ? tadalafil (CIALIS) 20 MG tablet Take 20 mg by mouth once as needed (very high blood pressure with fluctuations).    ? tamsulosin (FLOMAX) 0.4 MG CAPS capsule Take 0.4 mg by mouth daily. Takes at 2100    ? ?No facility-administered medications prior to visit.  ?  ? ?Allergies  ?Allergen Reactions  ? Amlodipine Other (See Comments) and Swelling  ?  Muscle tightness, fatigue   ? Amlodipine Besylate   ?  Other reaction(s): Unknown ?Other reaction(s): Unknown  ? Nebivolol Hcl Other (See Comments) and Swelling  ?  Muscle tightness and fatigue  ?Other reaction(s): Unknown ?Other reaction(s): Unknown  ? Statins Other (See Comments)  ?  Muscle tightness and fatigue  ?Other reaction(s): Unknown ?Other reaction(s): Myositis, Unknown  ? Latex Rash  ? ? ?Social History  ? ?Tobacco Use  ? Smoking status: Former  ?  Packs/day: 1.50  ?  Years: 30.00  ?  Pack years: 45.00  ?  Types: Cigarettes  ? Smokeless tobacco: Never  ? Tobacco comments:  ?  QUIT 15 YRS AGO  ?Vaping Use  ? Vaping Use: Never used  ?Substance Use Topics  ? Alcohol use: Yes  ?  Comment: SELDOM; several shots of bourbon 07/17/2021  ? Drug use: Not Currently  ? ? ?Family History  ?Problem Relation Age of Onset  ? Peptic Ulcer Mother   ? Cirrhosis Mother   ? Alcohol abuse Mother   ? Bladder Cancer Father   ? Throat cancer Maternal Grandfather   ? ? ?Objective:  ? ?Vitals:  ? 11/25/21 1052  ?BP: (!) 143/87  ?Pulse: 90  ?Resp: 16  ?Temp: 98 ?F (36.7 ?C)  ?TempSrc: Oral  ?SpO2: 97%  ?Weight: 176 lb 6.4 oz (80 kg)  ?Height: _0  (1.727 m)  ? ?Body mass index is  26.82 kg/m?. ? ?Physical Exam ?Constitutional:   ?   General: He is not in acute distress. ?   Appearance: He is normal weight. He is not toxic-appearing.  ?HENT:  ?   Head: Normocephalic and atraumatic.  ?   Right Ear: External ear normal.  ?   Left Ear: External ear normal.  ?   Nose: No congestion or rhinorrhea.  ?   Mouth/Throat:  ?   Mouth: Mucous membranes are moist.  ?   Pharynx: Oropharynx is clear.  ?Eyes:  ?   Extraocular Movements: Extraocular movements intact.  ?   Conjunctiva/sclera: Conjunctivae normal.  ?   Pupils: Pupils are equal, round, and reactive to light.  ?Cardiovascular:  ?  Rate and Rhythm: Normal rate and regular rhythm.  ?   Heart sounds: No murmur heard. ?  No friction rub. No gallop.  ?Pulmonary:  ?   Effort: Pulmonary effort is normal.  ?   Breath sounds: Normal breath sounds.  ?Abdominal:  ?   General: Abdomen is flat. Bowel sounds are normal.  ?   Palpations: Abdomen is soft.  ?   Comments: RLQ drain  ?Musculoskeletal:     ?   General: No swelling. Normal range of motion.  ?   Cervical back: Normal range of motion and neck supple.  ?Skin: ?   General: Skin is warm and dry.  ?Neurological:  ?   General: No focal deficit present.  ?   Mental Status: He is oriented to person, place, and time.  ?Psychiatric:     ?   Mood and Affect: Mood normal.  ? ? ?Lab Results: ?Lab Results  ?Component Value Date  ? WBC 9.5 11/18/2021  ? HGB 10.0 (L) 11/18/2021  ? HCT 31.2 (L) 11/18/2021  ? MCV 87.4 11/18/2021  ? PLT 236 11/18/2021  ?  ?Lab Results  ?Component Value Date  ? CREATININE 1.05 11/18/2021  ? BUN 23 11/18/2021  ? NA 135 11/18/2021  ? K 3.6 11/18/2021  ? CL 102 11/18/2021  ? CO2 24 11/18/2021  ?  ?Lab Results  ?Component Value Date  ? ALT 12 11/15/2021  ? AST 11 (L) 11/15/2021  ? ALKPHOS 98 11/15/2021  ? BILITOT 0.6 11/15/2021  ?  ? ?Assessment & Plan:  ? ?Problem List Items Addressed This Visit   ?None ?Visit Diagnoses   ? ? Abscess    -  Primary  ? Relevant Orders  ? CBC with  Differential/Platelet  ? Comprehensive metabolic panel  ? CRP (C-Reactive Protein)  ? Sed Rate (ESR)  ? ?  ? ?#Left rectus muscle abscess adjacent to mesh SP US guided aspiration ?#Right rectus muscle abscess SP

## 2021-11-26 LAB — C-REACTIVE PROTEIN: CRP: 8.1 mg/L — ABNORMAL HIGH (ref <8.0)

## 2021-11-26 LAB — COMPREHENSIVE METABOLIC PANEL
AG Ratio: 1.5 (calc) (ref 1.0–2.5)
ALT: 20 U/L (ref 9–46)
AST: 14 U/L (ref 10–35)
Albumin: 4.5 g/dL (ref 3.6–5.1)
Alkaline phosphatase (APISO): 97 U/L (ref 35–144)
BUN/Creatinine Ratio: 22 (calc) (ref 6–22)
BUN: 26 mg/dL — ABNORMAL HIGH (ref 7–25)
CO2: 24 mmol/L (ref 20–32)
Calcium: 10.6 mg/dL — ABNORMAL HIGH (ref 8.6–10.3)
Chloride: 104 mmol/L (ref 98–110)
Creat: 1.2 mg/dL (ref 0.70–1.28)
Globulin: 3 g/dL (calc) (ref 1.9–3.7)
Glucose, Bld: 145 mg/dL — ABNORMAL HIGH (ref 65–99)
Potassium: 4.3 mmol/L (ref 3.5–5.3)
Sodium: 141 mmol/L (ref 135–146)
Total Bilirubin: 0.3 mg/dL (ref 0.2–1.2)
Total Protein: 7.5 g/dL (ref 6.1–8.1)

## 2021-11-26 LAB — CBC WITH DIFFERENTIAL/PLATELET
Absolute Monocytes: 877 cells/uL (ref 200–950)
Basophils Absolute: 14 cells/uL (ref 0–200)
Basophils Relative: 0.1 %
Eosinophils Absolute: 82 cells/uL (ref 15–500)
Eosinophils Relative: 0.6 %
HCT: 37.2 % — ABNORMAL LOW (ref 38.5–50.0)
Hemoglobin: 12 g/dL — ABNORMAL LOW (ref 13.2–17.1)
Lymphs Abs: 1534 cells/uL (ref 850–3900)
MCH: 28 pg (ref 27.0–33.0)
MCHC: 32.3 g/dL (ref 32.0–36.0)
MCV: 86.9 fL (ref 80.0–100.0)
MPV: 9.1 fL (ref 7.5–12.5)
Monocytes Relative: 6.4 %
Neutro Abs: 11193 cells/uL — ABNORMAL HIGH (ref 1500–7800)
Neutrophils Relative %: 81.7 %
Platelets: 414 10*3/uL — ABNORMAL HIGH (ref 140–400)
RBC: 4.28 10*6/uL (ref 4.20–5.80)
RDW: 13.2 % (ref 11.0–15.0)
Total Lymphocyte: 11.2 %
WBC: 13.7 10*3/uL — ABNORMAL HIGH (ref 3.8–10.8)

## 2021-11-26 LAB — SEDIMENTATION RATE: Sed Rate: 31 mm/h — ABNORMAL HIGH (ref 0–20)

## 2021-11-27 ENCOUNTER — Telehealth: Payer: Self-pay | Admitting: Internal Medicine

## 2021-11-27 MED ORDER — DOXYCYCLINE HYCLATE 100 MG PO TABS: 100.0000 mg | ORAL_TABLET | Freq: Two times a day (BID) | ORAL | 3 refills | Status: DC

## 2021-11-27 MED ORDER — CEFADROXIL 500 MG PO CAPS: 500.0000 mg | ORAL_CAPSULE | Freq: Two times a day (BID) | ORAL | 0 refills | Status: AC

## 2021-11-27 NOTE — Telephone Encounter (Signed)
Patient called over the weekend to id call line asking for cefadroxil and doxycycline. ? ?Saw dr Ward Chatters 3/17 -- reviewed note.  ? ?Rx was placed not sure why he didn't see ? ?Resend rx to [facility] on [address] already on file ?

## 2021-11-28 ENCOUNTER — Encounter: Payer: Self-pay | Admitting: Neurology

## 2021-11-28 ENCOUNTER — Other Ambulatory Visit: Payer: Self-pay

## 2021-11-28 ENCOUNTER — Ambulatory Visit: Payer: Medicare Other | Admitting: Neurology

## 2021-11-28 VITALS — BP 143/80 | HR 96 | Ht 68.0 in | Wt 176.0 lb

## 2021-11-28 DIAGNOSIS — I639 Cerebral infarction, unspecified: Secondary | ICD-10-CM | POA: Diagnosis not present

## 2021-11-28 DIAGNOSIS — G40209 Localization-related (focal) (partial) symptomatic epilepsy and epileptic syndromes with complex partial seizures, not intractable, without status epilepticus: Secondary | ICD-10-CM

## 2021-11-28 MED ORDER — REPATHA SURECLICK 140 MG/ML ~~LOC~~ SOAJ: 140.0000 mg | SUBCUTANEOUS | 3 refills | Status: DC

## 2021-11-28 MED ORDER — LEVETIRACETAM 500 MG PO TABS: 500.0000 mg | ORAL_TABLET | Freq: Two times a day (BID) | ORAL | 2 refills | Status: DC

## 2021-11-28 NOTE — Progress Notes (Signed)
?[facility] ?[address] ?[place]. [place] [address] ?[phone] ? ?     OFFICE CONSULT NOTE ? ?[name] ?Date of Birth:  [birth year] ?Medical Record Number:  [id number]  ? ?Referring MD:  Richard Moron, NP ? ?Reason for Referral:  Stroke ? ?HPI: Richard Austin is a pleasant 51 Caucasian male seen today for initial office consultation visit for stroke.  History is obtained from the patient and review of electronic medical records and opossum reviewed pertinent available imaging films in PACS.  He has past medical history for hypertension, hyperlipidemia, bladder cancer, gastroesophageal reflux disease, benign prostatic hypertrophy.  He presented on 09/21/2021 with sudden onset of left facial weakness, left arm and leg weakness and slurred speech.  EMS was called and he was brought to the hospital quickly and given IV TNK.  His left hand strength was limited due to carpal tunnel surgery that he had recently had.  Follow-up MRI and CT scan showed hemorrhagic transformation right MCA infarct.  He was kept in the ICU blood pressure is tightly controlled..  CT angiogram of the head and neck showed severe ostial stenosis of the origin of the right vertebral artery and diffuse tortuosity of major vessels but no significant intracranial stenosis or occlusion.  MRI of the brain showed 2.5 x 2.5 to 2.9 cm right posterior frontal intraparenchymal hematoma with estimated volume of 9 mL.  There was mild localized vasogenic edema without significant midline shift or mass effect.  2D echo showed ejection fraction of 60 to 65%.  Patient subsequently after discharge had an outpatient 30-day heart monitor but no paroxysmal A-fib or significant cardiac arrhythmia was found in the study was done in the [place] system and report is not available for me to review today.  His LDL cholesterol is elevated 137 mg percent and hemoglobin A1c of 6.4.  2D echo showed ejection fraction of 60 to 65% without cardiac source  of embolism.  Patient had history of multiple statin intolerance and statin was not added and he was discharged on Zetia 10 mg daily.  On discharge he had the left facial droop and 2/5 strength in the left upper extremity.  Patient states is done well since discharge.  Left-sided strength is almost improved back to baseline.  His facial droop has resolved.  He is hospital course however event complicated by developing multiple abscesses initially in the groin which required drainage and subsequently in the rectus muscle and abdomen and he is undergone prolonged antibiotics and is following up with Dr. Candiss Austin from infectious disease.  Patient also had focal motor seizures during the hospitalization and he has been started on Keppra.  Patient does complain of mild side effects like dizziness and drowsiness and lightheaded feeling.  He is currently taking 500 mg twice daily.  His blood pressure under good control today it is 143/80.  Patient is willing to try Repatha injections if approved by his insurance.  He has not been driving. ? ?ROS:   ?14 system review of systems is positive for weakness, abdominal abscess, statin intolerance all other systems negative ? ?PMH:  ?Past Medical History:  ?Diagnosis Date  ? Arthritis   ? Bladder tumor   ? Diverticulitis   ? Diverticulosis   ? Hypertension   ? 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 2019  ? Pre-diabetes   ? ? ?Social History:  ?Social History  ? ?Socioeconomic History  ? Marital status: Married  ?  Spouse name: Richard Austin  ? Number  of children: Not on file  ? Years of education: Not on file  ? Highest education level: Not on file  ?Occupational History  ? Not on file  ?Tobacco Use  ? Smoking status: Former  ?  Packs/day: 1.50  ?  Years: 30.00  ?  Pack years: 45.00  ?  Types: Cigarettes  ? Smokeless tobacco: Never  ? Tobacco comments:  ?  QUIT 15 YRS AGO  ?Vaping Use  ? Vaping Use: Never used  ?Substance and Sexual Activity  ? Alcohol use: Yes  ?  Comment: SELDOM; several  shots of bourbon 07/17/2021  ? Drug use: Not Currently  ? Sexual activity: Not on file  ?Other Topics Concern  ? Not on file  ?Social History Narrative  ? Worked in IT  ? ?Social Determinants of Health  ? ?Financial Resource Strain: Low Risk   ? Difficulty of Paying Living Expenses: Not hard at all  ?Food Insecurity: No Food Insecurity  ? Worried About Charity fundraiser in the Last Year: Never true  ? Ran Out of Food in the Last Year: Never true  ?Transportation Needs: No Transportation Needs  ? Lack of Transportation (Medical): No  ? Lack of Transportation (Non-Medical): No  ?Physical Activity: Not on file  ?Stress: No Stress Concern Present  ? Feeling of Stress : Only a little  ?Social Connections: Not on file  ?Intimate Partner Violence: Not At Risk  ? Fear of Current or Ex-Partner: No  ? Emotionally Abused: No  ? Physically Abused: No  ? Sexually Abused: No  ? ? ?Medications:   ?Current Outpatient Medications on File Prior to Visit  ?Medication Sig Dispense Refill  ? aspirin EC 81 MG tablet Take 81 mg by mouth daily. Swallow whole.    ? Azelastine HCl 137 MCG/SPRAY SOLN     ? cefadroxil (DURICEF) 500 MG capsule Take 1 capsule (500 mg total) by mouth 2 (two) times daily. 60 capsule 0  ? doxycycline (VIBRA-TABS) 100 MG tablet Take 1 tablet (100 mg total) by mouth 2 (two) times daily. 180 tablet 3  ? ezetimibe (ZETIA) 10 MG tablet Take 1 tablet (10 mg total) by mouth daily. 30 tablet 1  ? fluticasone (FLONASE) 50 MCG/ACT nasal spray Place 2 sprays into both nostrils 2 (two) times daily as needed for allergies or rhinitis.     ? lisinopril (PRINIVIL,ZESTRIL) 40 MG tablet Take 40 mg by mouth every morning.    ? Multiple Vitamin (MULTIVITAMIN) tablet Take 1 tablet by mouth daily.    ? omeprazole (PRILOSEC) 40 MG capsule Take 40 mg by mouth daily.    ? Polyvinyl Alcohol-Povidone PF (REFRESH) 1.4-0.6 % SOLN Place 1 drop into both eyes daily as needed (dry eyes).    ? Sodium Chloride Flush (NORMAL SALINE FLUSH) 0.9 %  SOLN Flush 3 - 5 mls via catheter daily for 20 days 200 mL 0  ? tadalafil (CIALIS) 20 MG tablet Take 20 mg by mouth once as needed (very high blood pressure with fluctuations).    ? tamsulosin (FLOMAX) 0.4 MG CAPS capsule Take 0.4 mg by mouth daily. Takes at 2100    ? ?No current facility-administered medications on file prior to visit.  ? ? ?Allergies:   ?Allergies  ?Allergen Reactions  ? Amlodipine Other (See Comments) and Swelling  ?  Muscle tightness, fatigue   ? Amlodipine Besylate   ?  Other reaction(s): Unknown ?Other reaction(s): Unknown  ? Nebivolol Hcl Other (See Comments) and Swelling  ?  Muscle tightness and fatigue  ?Other reaction(s): Unknown ?Other reaction(s): Unknown  ? Statins Other (See Comments)  ?  Muscle tightness and fatigue  ?Other reaction(s): Unknown ?Other reaction(s): Myositis, Unknown  ? Latex Rash  ? ? ?Physical Exam ?General: well developed, well nourished pleasant elderly Caucasian male, seated, in no evident distress ?Head: head normocephalic and atraumatic.   ?Neck: supple with no carotid or supraclavicular bruits ?Cardiovascular: regular rate and rhythm, no murmurs ?Musculoskeletal: no deformity ?Skin:  no rash/petichiae ?Vascular:  Normal pulses all extremities ? ?Neurologic Exam ?Mental Status: Awake and fully alert. Oriented to place and time. Recent and remote memory intact. Attention span, concentration and fund of knowledge appropriate. Mood and affect appropriate.  ?Cranial Nerves: Fundoscopic exam reveals sharp disc margins. Pupils equal, briskly reactive to light. Extraocular movements full without nystagmus. Visual fields full to confrontation. Hearing intact. Facial sensation intact. Face, tongue, palate moves normally and symmetrically.  ?Motor: Normal bulk and tone. Normal strength in all tested extremity muscles.  Diminished fine finger movements on the left.  Orbits right over left upper extremity.  Trace left grip weakness. ?Sensory.: intact to touch , pinprick ,  position and vibratory sensation.  ?Coordination: Rapid alternating movements normal in all extremities. Finger-to-nose and heel-to-shin performed accurately bilaterally. ?Gait and Station: Arises from [employer]

## 2021-11-28 NOTE — Patient Instructions (Addendum)
I had a long d/w patient about his recent cryptogenic stroke and symptomatic seizure,, risk for recurrent stroke/TIAs, personally independently reviewed imaging studies and stroke evaluation results and answered questions.Continue aspirin 81 mg daily  for secondary stroke prevention and maintain strict control of hypertension with blood pressure goal below 130/90, diabetes with hemoglobin A1c goal below 6.5% and lipids with LDL cholesterol goal below 70 mg/dL. I also advised the patient to eat a healthy diet with plenty of whole grains, cereals, fruits and vegetables, exercise regularly and maintain ideal body weight.  I recommend starting Repatha injections to lower his cholesterol as he has demonstrated severe statin intolerance in the past.  Continue Keppra for seizure prophylaxis but reduce the dose to 250 mg in the morning and 500 nightly with side effects like drowsiness and lightheadedness.  These do not improve may consider reducing dose further to 250 mg twice daily.  He was advised to avoid seizure provoking stimuli like sleep deprivation n, medication noncompliance irregular eating, sleeping physical, activity.  He was also advised not to drive for 6 months following his seizure as per [facility]. ?Follow-up in the future with me in my nurse practitioner Janett Billow in 3 months or call earlier if necessary. ? ?Stroke Prevention ?Some medical conditions and behaviors can lead to a higher chance of having a stroke. You can help prevent a stroke by eating healthy, exercising, not smoking, and managing any medical conditions you have. ?Stroke is a leading cause of functional impairment. Primary prevention is particularly important because a majority of strokes are first-time events. Stroke changes the lives of not only those who experience a stroke but also their family and other caregivers. ?How can this condition affect me? ?A stroke is a medical emergency and should be treated right away. A stroke can  lead to brain damage and can sometimes be life-threatening. If a person gets medical treatment right away, there is a better chance of surviving and recovering from a stroke. ?What can increase my risk? ?The following medical conditions may increase your risk of a stroke: ?Cardiovascular disease. ?High blood pressure (hypertension). ?Diabetes. ?High cholesterol. ?Sickle cell disease. ?Blood clotting disorders (hypercoagulable state). ?Obesity. ?Sleep disorders (obstructive sleep apnea). ?Other risk factors include: ?Being older than age 61. ?Having a history of blood clots, stroke, or mini-stroke (transient ischemic attack, TIA). ?Genetic factors, such as race, ethnicity, or a family history of stroke. ?Smoking cigarettes or using other tobacco products. ?Taking birth control pills, especially if you also use tobacco. ?Heavy use of alcohol or drugs, especially cocaine and methamphetamine. ?Physical inactivity. ?What actions can I take to prevent this? ?Manage your health conditions ?High cholesterol levels. ?Eating a healthy diet is important for preventing high cholesterol. If cholesterol cannot be managed through diet alone, you may need to take medicines. ?Take any prescribed medicines to control your cholesterol as told by your health care provider. ?Hypertension. ?To reduce your risk of stroke, try to keep your blood pressure below 130/80. ?Eating a healthy diet and exercising regularly are important for controlling blood pressure. If these steps are not enough to manage your blood pressure, you may need to take medicines. ?Take any prescribed medicines to control hypertension as told by your health care provider. ?Ask your health care provider if you should monitor your blood pressure at home. ?Have your blood pressure checked every year, even if your blood pressure is normal. Blood pressure increases with age and some medical conditions. ?Diabetes. ?Eating a healthy diet and exercising regularly  are important  parts of managing your blood sugar (glucose). If your blood sugar cannot be managed through diet and exercise, you may need to take medicines. ?Take any prescribed medicines to control your diabetes as told by your health care provider. ?Get evaluated for obstructive sleep apnea. Talk to your health care provider about getting a sleep evaluation if you snore a lot or have excessive sleepiness. ?Make sure that any other medical conditions you have, such as atrial fibrillation or atherosclerosis, are managed. ?Nutrition ?Follow instructions from your health care provider about what to eat or drink to help manage your health condition. These instructions may include: ?Reducing your daily calorie intake. ?Limiting how much salt (sodium) you use to 1,500 milligrams (mg) each day. ?Using only healthy fats for cooking, such as olive oil, canola oil, or sunflower oil. ?Eating healthy foods. You can do this by: ?Choosing foods that are high in fiber, such as whole grains, and fresh fruits and vegetables. ?Eating at least 5 servings of fruits and vegetables a day. Try to fill one-half of your plate with fruits and vegetables at each meal. ?Choosing lean protein foods, such as lean cuts of meat, poultry without skin, fish, tofu, beans, and nuts. ?Eating low-fat dairy products. ?Avoiding foods that are high in sodium. This can help lower blood pressure. ?Avoiding foods that have saturated fat, trans fat, and cholesterol. This can help prevent high cholesterol. ?Avoiding processed and prepared foods. ?Counting your daily carbohydrate intake. ? ?Lifestyle ?If you drink alcohol: ?Limit how much you have to: ?0-1 drink a day for women who are not pregnant. ?0-2 drinks a day for men. ?Know how much alcohol is in your drink. In the [place] one drink equals one 12 oz bottle of beer (364m), one 5 oz glass of wine (1466m, or one 1? oz glass of hard liquor (4466m ?Do not use any products that contain nicotine or tobacco. These products  include cigarettes, chewing tobacco, and vaping devices, such as e-cigarettes. If you need help quitting, ask your health care provider. ?Avoid secondhand smoke. ?Do not use drugs. ?Activity ? ?Try to stay at a healthy weight. ?Get at least 30 minutes of exercise on most days, such as: ?Fast walking. ?Biking. ?Swimming. ?Medicines ?Take over-the-counter and prescription medicines only as told by your health care provider. Aspirin or blood thinners (antiplatelets or anticoagulants) may be recommended to reduce your risk of forming blood clots that can lead to stroke. ?Avoid taking birth control pills. Talk to your health care provider about the risks of taking birth control pills if: ?You are over 35 47ars old. ?You smoke. ?You get very bad headaches. ?You have had a blood clot. ?Where to find more information ?American Stroke Association: www.strokeassociation.org ?Get help right away if: ?You or a loved one has any symptoms of a stroke. "BE FAST" is an easy way to remember the main warning signs of a stroke: ?B - Balance. Signs are dizziness, sudden trouble walking, or loss of balance. ?E - Eyes. Signs are trouble seeing or a sudden change in vision. ?F - Face. Signs are sudden weakness or numbness of the face, or the face or eyelid drooping on one side. ?A - Arms. Signs are weakness or numbness in an arm. This happens suddenly and usually on one side of the body. ?S - Speech. Signs are sudden trouble speaking, slurred speech, or trouble understanding what people say. ?T - Time. Time to call emergency services. Write down what time symptoms started. ?You or a  loved one has other signs of a stroke, such as: ?A sudden, severe headache with no known cause. ?Nausea or vomiting. ?Seizure. ?These symptoms may represent a serious problem that is an emergency. Do not wait to see if the symptoms will go away. Get medical help right away. Call your local emergency services (911 in the [place] Do not drive yourself to the  hospital. ?Summary ?You can help to prevent a stroke by eating healthy, exercising, not smoking, limiting alcohol intake, and managing any medical conditions you may have. ?Do not use any products that c

## 2021-11-29 ENCOUNTER — Other Ambulatory Visit: Payer: Self-pay

## 2021-11-29 ENCOUNTER — Encounter: Payer: Self-pay | Admitting: *Deleted

## 2021-11-29 ENCOUNTER — Other Ambulatory Visit: Payer: Self-pay | Admitting: *Deleted

## 2021-11-29 NOTE — Patient Outreach (Signed)
[facility]) Care Management Telephonic RN Care Manager Note   11/29/2021 Name:  Richard Austin MRN:  [id number] DOB:  Apr 16, 1946  Summary: Successful follow up outreach post hospital  Richard Austin reports he is doing good He reports his wound looks good- He denies signs of infection to the site. He discussed his wound site history related to mesh placement after hernia surgery He confirms he will have a CT on 11/30/21 and he hopes to the drain removed Pt does not prefer abdominal surgery  He only had one sign of infection red skin, pain   He confirms he attended his neurology appointment on 11/28/21 and it went well. Office note indicates further follow up can occur with PA/NP in neurology office. [facility] also [facility]   He also gets outreaches from the [place] [facility] ([place]) He had outreaches from his pcp pharmacy and RN  Richard Horney confirms he did recently have cardiac monitoring  January-February 2023 via [place] that confirmed he did not have atrial fibrillation  RN CM unable to view results in [facility] care everywhere   Richard Andis reports he did receive introduction calls from external pharmacy and CRN but no follow up outreaches. He reports the services are prn   Recommendations/Changes made from today's visit: Assessed for worsening symptoms of abdominal site Signs of infections (warmth, temperature increase, increase drainage, redness, pain, to monitor for discussed  Inquired about external pharmacy and CRN (pcp) - encouraged pt to make a connection with staff Discussed [facility] progression, external care management services   Subjective: Richard Austin is an 76 y.o. year old male who is a primary patient of Irena Reichmann, DO. The care management team was consulted for assistance with care management and/or care coordination needs.    Telephonic RN Care Manager completed Telephone Visit today.   Objective:  Medications Reviewed Today      Reviewed by Clinton Gallant, RN (Registered Nurse) on 11/29/21 at 1413  Med List Status: <None>   Medication Order Taking? Sig Documenting Provider Last Dose Status Informant  aspirin EC 81 MG tablet [id number] No Take 81 mg by mouth daily. Swallow whole. [provider] Taking Active Spouse/Significant Other, Self  Azelastine HCl 137 MCG/SPRAY SOLN [id number] No  [provider] Taking Active Self  cefadroxil (DURICEF) 500 MG capsule [id number] No Take 1 capsule (500 mg total) by mouth 2 (two) times daily. Raymondo Band, MD Taking Active   doxycycline (VIBRA-TABS) 100 MG tablet [id number] No Take 1 tablet (100 mg total) by mouth 2 (two) times daily. Raymondo Band, MD Taking Active   ezetimibe (ZETIA) 10 MG tablet [id number] No Take 1 tablet (10 mg total) by mouth daily. Elmer Picker, NP Taking Active Spouse/Significant Other, Self  fluticasone (FLONASE) 50 MCG/ACT nasal spray [id number] No Place 2 sprays into both nostrils 2 (two) times daily as needed for allergies or rhinitis.  [provider] Taking Active Spouse/Significant Other, Self  levETIRAcetam (KEPPRA) 500 MG tablet [id number]  Take 1 tablet (500 mg total) by mouth 2 (two) times daily. Take 250 mg in am and 500 mg at night Micki Riley, MD  Active   lisinopril (PRINIVIL,ZESTRIL) 40 MG tablet [id number] No Take 40 mg by mouth every morning. [provider] Taking Active Spouse/Significant Other, Self  Multiple Vitamin (MULTIVITAMIN) tablet [id number] No Take 1 tablet by mouth daily. [provider] Taking Active Spouse/Significant Other, Self  omeprazole (PRILOSEC) 40 MG capsule [id number] No  Take 40 mg by mouth daily. [provider] Taking Active Spouse/Significant Other, Self  Polyvinyl Alcohol-Povidone PF (REFRESH) 1.4-0.6 % SOLN [id number] No Place 1 drop into both eyes daily as needed (dry eyes). [provider] Taking Active Spouse/Significant Other, Self  REPATHA SURECLICK  140 MG/ML SOAJ [id number]  Inject 140 mg into the skin every 14 (fourteen) days. Micki Riley, MD  Active   Sodium Chloride Flush (NORMAL SALINE FLUSH) 0.9 % SOLN [id number] No Flush 3 - 5 mls via catheter daily for 20 days Boisseau, Hayley, PA Taking Active   tadalafil (CIALIS) 20 MG tablet [id number] No Take 20 mg by mouth once as needed (very high blood pressure with fluctuations). [provider] Taking Active Spouse/Significant Other, Self  tamsulosin (FLOMAX) 0.4 MG CAPS capsule [id number] No Take 0.4 mg by mouth daily. Takes at 2100 [provider] Taking Active Spouse/Significant Other, Self           Patient Active Problem List   Diagnosis Date Noted   Seizure disorder (HCC) 11/16/2021   Abdominal pain 11/16/2021   Pulmonary nodule 11/16/2021   Normocytic anemia 11/16/2021   Hyperlipidemia, unspecified 11/01/2021   Myositis 11/01/2021   Palpitations 11/01/2021   HLD (hyperlipidemia) 11/01/2021   History of seizure 11/01/2021   Pain in joint of left shoulder 11/01/2021   Encounter for orthopedic follow-up care 10/06/2021   Seizure-like activity (HCC) 10/02/2021   History of cerebrovascular accident (CVA) with residual deficit 10/02/2021   Allergic rhinitis 09/26/2021   Bladder cancer (HCC) 09/26/2021   Chronic kidney disease, stage 3a (HCC) 09/26/2021   Dyslipidemia 09/26/2021   Erectile dysfunction 09/26/2021   GERD (gastroesophageal reflux disease) 09/26/2021   Osteoarthritis 09/26/2021   Type 2 diabetes mellitus without complications (HCC) 09/26/2021   Right wrist pain 09/24/2021   Pain of left hand 09/24/2021   Cerebral infarction, unspecified (HCC) 09/21/2021   Injury of left shoulder 09/19/2021   Abscess of groin, right 07/19/2021   Subcutaneous abscess 07/18/2021   Numbness of hand 07/13/2021   Spinal stenosis in cervical region 05/22/2021   Neck pain 03/07/2021   BPH (benign prostatic hyperplasia) 03/02/2020   Diverticulosis of large  intestine 03/02/2020   SBO (small bowel obstruction) (HCC) 07/06/2018   AKI (acute kidney injury) (HCC) 07/06/2018   Hyperglycemia 07/06/2018   High blood pressure 07/06/2018   Leukocytosis 07/06/2018   Bladder mass 07/06/2018   Pericardial effusion 07/06/2018   Hypercalcemia 07/06/2018     SDOH:  (Social Determinants of Health) assessments and interventions performed:  SDOH Interventions    Flowsheet Row Most Recent Value  SDOH Interventions   Financial Strain Interventions Intervention Not Indicated  Housing Interventions Intervention Not Indicated  Social Connections Interventions Intervention Not Indicated  Transportation Interventions Intervention Not Indicated       Care Plan  Review of patient past medical history, allergies, medications, health status, including review of consultants reports, laboratory and other test data, was performed as part of comprehensive evaluation for care management services.   Care Plan : RN Care Manager Plan of Care  Updates made by Clinton Gallant, RN since 11/29/2021 12:00 AM     Problem: Complex Care Coordination Needs and disease management in patient with stroke, HTN, DM   Priority: High  Onset Date: 09/26/2021     Long-Range Goal: Establish Plan of Care for Management Complex SDOH Barriers, disease management and Care Coordination Needs in patient with stroke, HTN, DM   Start Date: 09/26/2021  This Visit's Progress: On  track  Recent Progress: On track  Priority: High  Note:   Current Barriers:  Knowledge Deficits related to plan of care for management of HTN, DMII, and stroke  Care Coordination needs related to Limited education about stroke, HTN, DM II HTN* EMMI stroke red alerts x 2 for appointments (09/26/21 & 10/10/21) resolved 10/10/21 09/21/21 to 09/23/21 admission,  11/15/21 admission for abdominal abscess with surgery on 11/16/21 -discharge 11/19/21 abdominal wound drain  RN CM Clinical Goal(s):  Patient will verbalize  understanding of plan for management of HTN, DMII, and stroke as evidenced by voiced improved BP, HgA1c,decrease risk for re admission in next 30 business days  through collaboration with Medical illustrator, provider, and care team. - Goal met on 10/24/21 but revised with re admission on 11/15/21  Interventions: Further outreach for care coordination, disease management/education Inter-disciplinary care team collaboration (see longitudinal plan of care) Evaluation of current treatment plan related to  self management and patient's adherence to plan as established by provider 10/10/21 Assessed for concerns with scheduling appointments per new Friday 10/07/21 10 am EMMI stroke red alert referral received today 09/30/21 Denies other needs at this time 11/21/21 Outreach to [place] neurology, spoke with Joanne Gavel about Richard Paco outreach to RN CM and his need to have a follow up neuro appointment. Chart review. Arianna spoke with staff at the office, cancelled the appointment with neurology NP and sent note to Dr Pearlean Brownie and/or RN to assist with possibly getting pt seen in 1-2 weeks.  Richard Mcquown updated He agrees to follow up with RN CM prn    Diabetes Interventions:  (Status:  Goal on track:  Yes.) Short Term Goal 11/29/21 Remains stable Assessed patient's understanding of A1c goal: <7% Discussed plans with patient for ongoing care management follow up and provided patient with direct contact information for care management team Lab Results  Component Value Date   HGBA1C 6.4 (H) 09/22/2021   Hypertension Interventions:  (Status:  Goal on track:  Yes.) Long Term Goal Last practice recorded BP readings:  BP Readings from Last 3 Encounters:  11/28/21 (!) 143/80  11/25/21 (!) 143/87  11/19/21 (!) 148/74  Most recent eGFR/CrCl: No results found for: EGFR  No components found for: CRCL  Evaluation of current treatment plan related to hypertension self management and patient's adherence to plan as established by  provider  Stroke:  (Status:Goal on track:  Yes.) Long Term Goal 11/29/21 improving no worsening symptoms Reviewed Importance of taking all medications as prescribed Reviewed Importance of attending all scheduled provider appointments Assessed social determinant of health barriers 11/01/21 assessed for neurology visit  Patient Goals/Self-Care Activities: Take all medications as prescribed Attend all scheduled provider appointments Perform all self care activities independently  Perform IADL's (shopping, preparing meals, housekeeping, managing finances) independently Call provider office for new concerns or questions  Attended Neurology (Dr Pearlean Brownie) appointment on 11/28/21  Follow Up Plan:  The patient has been provided with contact information for the care management team and has been advised to call with any health related questions or concerns.  The care management team will reach out to the patient again over the next 30 + business  days.       Plan: The patient has been provided with contact information for the care management team and has been advised to call with any health related questions or concerns.  The care management team will reach out to the patient again over the next 15+ business days.  Saryah Loper L. Noelle Penner, RN,  BSN, CCM [facility] Telephonic Care Management Care Coordinator Office number [phone] Main [facility] number [phone] Fax number [phone]

## 2021-11-30 ENCOUNTER — Other Ambulatory Visit (HOSPITAL_COMMUNITY): Payer: Self-pay

## 2021-11-30 ENCOUNTER — Ambulatory Visit
Admission: RE | Admit: 2021-11-30 | Discharge: 2021-11-30 | Disposition: A | Discharge status: Home / Self Care | Payer: Medicare Other | Source: Ambulatory Visit | Attending: Physician Assistant | Admitting: Physician Assistant

## 2021-11-30 ENCOUNTER — Ambulatory Visit
Admission: RE | Admit: 2021-11-30 | Discharge: 2021-11-30 | Disposition: A | Discharge status: Home / Self Care | Payer: Medicare Other | Source: Ambulatory Visit | Attending: Surgery | Admitting: Surgery

## 2021-11-30 ENCOUNTER — Encounter: Payer: Self-pay | Admitting: *Deleted

## 2021-11-30 DIAGNOSIS — K611 Rectal abscess: Secondary | ICD-10-CM | POA: Diagnosis not present

## 2021-11-30 DIAGNOSIS — K6289 Other specified diseases of anus and rectum: Secondary | ICD-10-CM | POA: Diagnosis not present

## 2021-11-30 DIAGNOSIS — L02211 Cutaneous abscess of abdominal wall: Secondary | ICD-10-CM

## 2021-11-30 DIAGNOSIS — M6289 Other specified disorders of muscle: Secondary | ICD-10-CM | POA: Diagnosis not present

## 2021-11-30 HISTORY — PX: IR RADIOLOGIST EVAL & MGMT: IMG5224

## 2021-11-30 MED ORDER — IOPAMIDOL (ISOVUE-300) INJECTION 61%
100.0000 mL | Freq: Once | INTRAVENOUS | Status: AC | PRN
  Administered 2021-11-30: 100 mL via INTRAVENOUS

## 2021-11-30 NOTE — Progress Notes (Signed)
? ?Referring Physician(s): ?Dr Harlow Asa ? ?Chief Complaint: ?The patient is seen in follow up today s/p 11/16/21 drain: EXAM: ?1. Ultrasound-guided aspiration and drain placement in the right ?abdominal wall abscess ?2. Ultrasound-guided aspiration of left anterior abdominal wall ?fluid collection ? ?History of present illness: ? ?Pt is doing well. ?Eating well ?Denies pain; denies fever ?OP is minimal- serous color ?Flushes twice daily ?Follows with Dr Netta Cedars with ID ?Last visit 3/17:  taking Doxycyclin and Cefadroxil ?To follow with Dr Harlow Asa next week--- surgical mesh in place---at site of left rectus sheath abscess ? ?Scheduled here today for evaluation and CT  ? ? ? ?Past Medical History:  ?Diagnosis Date  ? Arthritis   ? Bladder tumor   ? Diverticulitis   ? Diverticulosis   ? Hypertension   ? 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 2019  ? Pre-diabetes   ? ? ?Past Surgical History:  ?Procedure Laterality Date  ? ABDOMINAL SURGERY    ? ABLATION FOR SVT  2000 OR 2001  ? APPENDECTOMY  1970s  ? COLOSTOMY    ? LATER REVERSED  ? IRRIGATION AND DEBRIDEMENT ABSCESS N/A 07/18/2021  ? Procedure: IRRIGATION AND DEBRIDEMENT ABSCESS;  Surgeon: Irine Seal, MD;  Location: [facility];  Service: Urology;  Laterality: N/A;  ? LUMBAR LAMINECTOMY  1970s  ? NESBIT PROCEDURE N/A   ? TRANSURETHRAL RESECTION OF BLADDER TUMOR WITH MITOMYCIN-C N/A 08/21/2018  ? Procedure: TRANSURETHRAL RESECTION OF BLADDER TUMOR WITH GEMCITABINE;  Surgeon: Lucas Mallow, MD;  Location: [facility];  Service: Urology;  Laterality: N/A;  ? ? ?Allergies: ?Amlodipine, Amlodipine besylate, Nebivolol hcl, Statins, and Latex ? ?Medications: ?Prior to Admission medications   ?Medication Sig Start Date End Date Taking? Authorizing Provider  ?aspirin EC 81 MG tablet Take 81 mg by mouth daily. Swallow whole.    [provider]  ?Azelastine HCl 137 MCG/SPRAY SOLN     [provider]  ?cefadroxil (DURICEF) 500 MG capsule Take 1 capsule (500 mg total) by  mouth 2 (two) times daily. 11/27/21 12/27/21  Vu, Rockey Situ, MD  ?doxycycline (VIBRA-TABS) 100 MG tablet Take 1 tablet (100 mg total) by mouth 2 (two) times daily. 11/27/21 11/22/22  Vu, Johnny Bridge T, MD  ?ezetimibe (ZETIA) 10 MG tablet Take 1 tablet (10 mg total) by mouth daily. 09/24/21   Janine Ores, NP  ?fluticasone (FLONASE) 50 MCG/ACT nasal spray Place 2 sprays into both nostrils 2 (two) times daily as needed for allergies or rhinitis.     [provider]  ?levETIRAcetam (KEPPRA) 500 MG tablet Take 1 tablet (500 mg total) by mouth 2 (two) times daily. Take 250 mg in am and 500 mg at night 11/28/21   Garvin Fila, MD  ?lisinopril (PRINIVIL,ZESTRIL) 40 MG tablet Take 40 mg by mouth every morning.    [provider]  ?Multiple Vitamin (MULTIVITAMIN) tablet Take 1 tablet by mouth daily.    [provider]  ?omeprazole (PRILOSEC) 40 MG capsule Take 40 mg by mouth daily.    [provider]  ?Polyvinyl Alcohol-Povidone PF (REFRESH) 1.4-0.6 % SOLN Place 1 drop into both eyes daily as needed (dry eyes).    [provider]  ?REPATHA SURECLICK 740 MG/ML SOAJ Inject 140 mg into the skin every 14 (fourteen) days. 11/28/21   Garvin Fila, MD  ?Sodium Chloride Flush (NORMAL SALINE FLUSH) 0.9 % SOLN Flush 3 - 5 mls via catheter daily for 20 days 11/19/21 12/31/21  Pasty Spillers, PA  ?tadalafil (CIALIS)  20 MG tablet Take 20 mg by mouth once as needed (very high blood pressure with fluctuations).    [provider]  ?tamsulosin (FLOMAX) 0.4 MG CAPS capsule Take 0.4 mg by mouth daily. Takes at 2100    [provider]  ?  ? ?Family History  ?Problem Relation Age of Onset  ? Peptic Ulcer Mother   ? Cirrhosis Mother   ? Alcohol abuse Mother   ? Bladder Cancer Father   ? Throat cancer Maternal Grandfather   ? ? ?Social History  ? ?Socioeconomic History  ? Marital status: Married  ?  Spouse name: Theophilus Kinds  ? Number of children: Not on file  ? Years of education: Not on file  ?  Highest education level: Not on file  ?Occupational History  ? Not on file  ?Tobacco Use  ? Smoking status: Former  ?  Packs/day: 1.50  ?  Years: 30.00  ?  Pack years: 45.00  ?  Types: Cigarettes  ? Smokeless tobacco: Never  ? Tobacco comments:  ?  QUIT 15 YRS AGO  ?Vaping Use  ? Vaping Use: Never used  ?Substance and Sexual Activity  ? Alcohol use: Yes  ?  Comment: SELDOM; several shots of bourbon 07/17/2021  ? Drug use: Not Currently  ? Sexual activity: Not on file  ?Other Topics Concern  ? Not on file  ?Social History Narrative  ? Worked in IT for 30 years  ? Has been married 4 times  ? ?Social Determinants of Health  ? ?Financial Resource Strain: Low Risk   ? Difficulty of Paying Living Expenses: Not hard at all  ?Food Insecurity: No Food Insecurity  ? Worried About Charity fundraiser in the Last Year: Never true  ? Ran Out of Food in the Last Year: Never true  ?Transportation Needs: No Transportation Needs  ? Lack of Transportation (Medical): No  ? Lack of Transportation (Non-Medical): No  ?Physical Activity: Not on file  ?Stress: No Stress Concern Present  ? Feeling of Stress : Only a little  ?Social Connections: Socially Integrated  ? Frequency of Communication with Friends and Family: Twice a week  ? Frequency of Social Gatherings with Friends and Family: Twice a week  ? Attends Religious Services: 1 to 4 times per year  ? Active Member of Clubs or Organizations: Yes  ? Attends Archivist Meetings: 1 to 4 times per year  ? Marital Status: Married  ? ? ? ?Vital Signs: ?There were no vitals taken for this visit. ? ?Physical Exam ?Skin: ?   General: Skin is warm.  ?   Comments: Site is clean and dry ?NT no bleeding ?No sign of infection ?Dr Kathlene Cote has read CT--- small collection Left abd wall; Rt collection is resolved ? ?Removal of drain at bedside---no complication ?Dressing placed  ? ? ?Imaging: ?No results found. ? ?Labs: ? ?CBC: ?Recent Labs  ?  11/16/21 ?0330 11/17/21 ?0319 11/18/21 ?9470  11/25/21 ?1138  ?WBC 16.8* 12.6* 9.5 13.7*  ?HGB 10.9* 10.3* 10.0* 12.0*  ?HCT 34.4* 32.5* 31.2* 37.2*  ?PLT 238 227 236 414*  ? ? ?COAGS: ?Recent Labs  ?  09/21/21 ?1924 10/02/21 ?1740  ?INR 0.9 0.9  ?APTT 29 29  ? ? ?BMP: ?Recent Labs  ?  11/15/21 ?0950 11/16/21 ?0330 11/17/21 ?0319 11/18/21 ?9628 11/25/21 ?1138  ?NA 139 135 137 135 141  ?K 4.2 3.6 3.9 3.6 4.3  ?CL 104 102 106 102 104  ?CO2 23 22  $'24 24 24  'd$ ?GLUCOSE 164* 133* 119* 135* 145*  ?BUN 26* 27* 29* 23 26*  ?CALCIUM 10.3 9.1 9.2 9.2 10.6*  ?CREATININE 1.38* 1.46* 1.36* 1.05 1.20  ?GFRNONAA 53* 50* 54* >60  --   ? ? ?LIVER FUNCTION TESTS: ?Recent Labs  ?  07/18/21 ?1221 09/21/21 ?1924 10/02/21 ?1740 11/15/21 ?0950 11/25/21 ?1138  ?BILITOT 0.7 0.4 0.4 0.6 0.3  ?AST 14* 22 18 11* 14  ?ALT '15 24 18 12 20  '$ ?ALKPHOS 73 87 96 98  --   ?PROT 7.1 6.5 7.1 7.8 7.5  ?ALBUMIN 4.0 3.5 3.8 4.4  --   ? ? ?Assessment: ? ?Right abdominal wall rectus sheath abscess ?Drain placed 3/8 in IR ?Doing well; OP minimal; flushing daily ?Follows with ID and still on antibx: Doxy and Cefadroxil ?To see Dr Harlow Asa next week regarding plan for left abd wall surgical mesh ?Rt drain removed today after CT read as collection resolved per Dr Kathlene Cote ?Pt leaves here today with good understanding of follow ups with ID and CS ? ? ?Signed: ?Lavonia Drafts, PA-C ?11/30/2021, 12:37 PM ? ? ?Please refer to Dr. Kathlene Cote attestation of this note for management and plan.  ? ? ? ? ?  ?

## 2021-12-01 NOTE — Progress Notes (Signed)
CT shows improvement after drainage and antibiotics.  Long term antibiotics per ID consultant. ? ?Armandina Gemma, MD ?[facility] ?A [facility] practice ?Office: [phone] ? ?

## 2021-12-05 ENCOUNTER — Telehealth: Payer: Self-pay

## 2021-12-05 NOTE — Telephone Encounter (Signed)
Received PA request for Repatha from [place]. Completed via CMM. Sent to [employer]. Should have a determination within 1-3 business days. ?

## 2021-12-05 NOTE — Telephone Encounter (Signed)
PA approved by [facility] for repatha. Effective from 12/05/2021 through 12/06/2022. [facility] notified. ?

## 2021-12-06 ENCOUNTER — Other Ambulatory Visit: Payer: Self-pay | Admitting: *Deleted

## 2021-12-06 DIAGNOSIS — D649 Anemia, unspecified: Secondary | ICD-10-CM | POA: Diagnosis not present

## 2021-12-06 DIAGNOSIS — I10 Essential (primary) hypertension: Secondary | ICD-10-CM | POA: Diagnosis not present

## 2021-12-06 DIAGNOSIS — E119 Type 2 diabetes mellitus without complications: Secondary | ICD-10-CM | POA: Diagnosis not present

## 2021-12-06 DIAGNOSIS — E785 Hyperlipidemia, unspecified: Secondary | ICD-10-CM | POA: Diagnosis not present

## 2021-12-06 NOTE — Patient Outreach (Signed)
[facility]) Care Management ?Telephonic RN Care Manager Note ? ? ?12/09/2021 ?Name:  Richard Austin MRN:  [id number] DOB:  1946-04-08 ? ?Summary: ?Follow up outreach successful post hospital #3 ?He is doing well abdominal wound healing well ?To see Dr Harlow Asa, general surgery, next week regarding a plan for left abdominal  wall surgical mesh options ? ?Stroke/Hyperlipidemia ?Question [name] inquires if the appointment that had been scheduled in April 2023 with [facility] NP could be  postponed until May 2023 considering he received his Repatha today 12/06/21 and will not start taking it until December 10 2021. The pt reports the Repatha brochure indicates it takes a month to note a change in cholesterol values  ? ?Hypertension ?Noted EPIC listed BP elevations 143-148/74-87 ?He read off some values to RN CM for home readings ?Home values 128/70s, 137/73, 128/74, 152/78 late in day, 147/70s on 11/01/21,  126/72 & 123/69 on 2/20/223 varies  ?He reports a history of elevations when he goes to doctor appointments ? ?Follow up appointments reviewed for 12/13/21 pcp & Dr Harlow Asa ? ? ?Recommendations/Changes made from today's visit: ?Assessed for worsening symptoms  ?Discussed white coat syndrome - encouraged having BP check towards end of doctor visits ?HTN education ?Sent EPIC message to Fara Olden Neurology NP to inquire about follow up appoint per pt inquiry related to [place] ? ? ?Subjective: ?Richard Austin is an 76 y.o. year old male who is a primary patient of Richard Morning, DO. The care management team was consulted for assistance with care management and/or care coordination needs.   ? ?Telephonic RN Care Manager completed Telephone Visit today.  ? ?Objective: ? ?Medications Reviewed Today   ? ? Reviewed by Barbaraann Faster, RN (Registered Nurse) on 11/29/21 at 1413  Med List Status: <None>  ? ?Medication Order Taking? Sig Documenting Provider Last Dose Status Informant  ?aspirin EC 81 MG  tablet [id number] No Take 81 mg by mouth daily. Swallow whole. [provider] Taking Active Spouse/Significant Other, Self  ?Azelastine HCl 137 MCG/SPRAY SOLN [id number] No  [provider] Taking Active Self  ?cefadroxil (DURICEF) 500 MG capsule [id number] No Take 1 capsule (500 mg total) by mouth 2 (two) times daily. Jabier Mutton, MD Taking Active   ?doxycycline (VIBRA-TABS) 100 MG tablet [id number] No Take 1 tablet (100 mg total) by mouth 2 (two) times daily. Jabier Mutton, MD Taking Active   ?ezetimibe (ZETIA) 10 MG tablet [id number] No Take 1 tablet (10 mg total) by mouth daily. Janine Ores, NP Taking Active Spouse/Significant Other, Self  ?fluticasone (FLONASE) 50 MCG/ACT nasal spray [id number] No Place 2 sprays into both nostrils 2 (two) times daily as needed for allergies or rhinitis.  [provider] Taking Active Spouse/Significant Other, Self  ?levETIRAcetam (KEPPRA) 500 MG tablet [id number]  Take 1 tablet (500 mg total) by mouth 2 (two) times daily. Take 250 mg in am and 500 mg at night Garvin Fila, MD  Active   ?lisinopril (PRINIVIL,ZESTRIL) 40 MG tablet [id number] No Take 40 mg by mouth every Austin. [provider] Taking Active Spouse/Significant Other, Self  ?Multiple Vitamin (MULTIVITAMIN) tablet [id number] No Take 1 tablet by mouth daily. [provider] Taking Active Spouse/Significant Other, Self  ?omeprazole (PRILOSEC) 40 MG capsule [id number] No Take 40 mg by mouth daily. [provider] Taking Active Spouse/Significant Other, Self  ?Polyvinyl Alcohol-Povidone PF (REFRESH) 1.4-0.6 % SOLN [id number] No Place 1 drop into both eyes daily as needed (dry eyes). [provider] Taking Active Spouse/Significant Other, Self  ?REPATHA SURECLICK 865 MG/ML SOAJ [id number]  Inject 140 mg into the skin every 14 (fourteen) days. Garvin Fila, MD  Active   ?Sodium Chloride Flush (NORMAL SALINE FLUSH) 0.9 % SOLN [id number] No Flush 3 - 5 mls via  catheter daily for 20 days Boisseau, Hayley, PA Taking Active   ?tadalafil (CIALIS) 20 MG tablet [id number] No Take 20 mg by mouth once as needed (very high blood pressure with fluctuations). [provider] Taking Active Spouse/Significant Other, Self  ?tamsulosin (FLOMAX) 0.4 MG CAPS capsule [id number] No Take 0.4 mg by mouth daily. Takes at 2100 [provider] Taking Active Spouse/Significant Other, Self  ? ?  ?  ? ?  ? ? ? ?SDOH:  (Social Determinants of Health) assessments and interventions performed:  ? ? ?Care Plan ? ?Review of patient past medical history, allergies, medications, health status, including review of consultants reports, laboratory and other test data, was performed as part of comprehensive evaluation for care management services.  ? ?Care Plan : RN Care Manager Plan of Care  ?Updates made by Barbaraann Faster, RN since 12/09/2021 12:00 AM  ?  ? ?Problem: Complex Care Coordination Needs and disease management in patient with stroke, HTN, DM   ?Priority: High  ?Onset Date: 09/26/2021  ?  ? ?Long-Range Goal: Establish Plan of Care for Management Complex SDOH Barriers, disease management and Care Coordination Needs in patient with stroke, HTN, DM   ?Start Date: 09/26/2021  ?This Visit's Progress: On track  ?Recent Progress: On track  ?Priority: High  ?Note:   ?Current Barriers:  ?Knowledge Deficits related to plan of care for management of HTN, DMII, and stroke  ?Care Coordination needs related to Limited education about stroke, HTN, DM II HTN* ?EMMI stroke red alerts x 2 for appointments (09/26/21 & 10/10/21) resolved 10/10/21 ?09/21/21 to 09/23/21 admission,  ?11/15/21 admission for abdominal abscess with surgery on 11/16/21 -discharge 11/19/21 abdominal wound drain ? ?RN CM Clinical Goal(s):  ?Patient will verbalize understanding of plan for management of HTN, DMII, and stroke as evidenced by voiced improved BP, HgA1c,decrease risk for re admission in next 30 business days  through  collaboration with RN Care manager, provider, and care team. - Goal met on 10/24/21 but revised with re admission on 11/15/21 ? ?Interventions: ?Further outreach for care coordination, disease management/education ?Inter-disciplinary care team collaboration (see longitudinal plan of care) ?Evaluation of current treatment plan related to  self management and patient's adherence to plan as established by provider ?10/10/21 Assessed for concerns with scheduling appointments per new Friday 10/07/21 10 am EMMI stroke red alert referral received today 09/30/21 Denies other needs at this time ?11/21/21 Outreach to [facility] neurology, spoke with Yves Dill about Mr Colavito outreach to RN CM and his need to have a follow up neuro appointment. Chart review. Arianna spoke with staff at the office, cancelled the appointment with neurology NP and sent note to Dr Leonie Man and/or RN to assist with possibly getting pt seen in 1-2 weeks.  ?Mr Reichelt updated He agrees to follow up with RN CM prn  ? ? ?Diabetes Interventions:  (Status:  Goal on track:  Yes.) Short Term Goal 11/29/21 Remains stable ?Assessed patient's understanding of A1c goal: <7% ?Discussed plans with patient for ongoing care management follow up and provided patient with direct contact information for care management team ?Lab Results  ?Component Value Date  ? HGBA1C 6.4 (H) 09/22/2021  ? ?Hypertension Interventions:  (Status:  continues to work on home management ) Long Term Goal ?Last practice recorded BP readings:  ?BP Readings from Last 3 Encounters:  ?11/28/21 (!) 143/80  ?11/25/21 (!) 143/87  ?11/19/21 (!) 148/74  ?Most recent eGFR/CrCl: No results found for: EGFR  No components found for: CRCL ? ?Evaluation of current treatment plan related to hypertension self management and patient's adherence to plan as established by provider ?Provided education to patient re: stroke prevention, s/s of heart attack and stroke ?Reviewed medications with patient and discussed importance of  compliance ?Discussed plans with patient for ongoing care management follow up and provided patient with direct contact information for care management team ?Advised patient, providing education and rationale, to m

## 2021-12-07 ENCOUNTER — Other Ambulatory Visit: Payer: Self-pay

## 2021-12-11 ENCOUNTER — Other Ambulatory Visit: Payer: Self-pay | Admitting: Internal Medicine

## 2021-12-12 ENCOUNTER — Telehealth: Payer: Self-pay | Admitting: Adult Health

## 2021-12-12 NOTE — Telephone Encounter (Signed)
Sent mychart msg informing pt of f/u appointment with Janett Billow. ?

## 2021-12-13 ENCOUNTER — Other Ambulatory Visit: Payer: Self-pay | Admitting: *Deleted

## 2021-12-13 DIAGNOSIS — M6008 Infective myositis, other site: Secondary | ICD-10-CM | POA: Insufficient documentation

## 2021-12-13 DIAGNOSIS — T8579XA Infection and inflammatory reaction due to other internal prosthetic devices, implants and grafts, initial encounter: Secondary | ICD-10-CM | POA: Insufficient documentation

## 2021-12-13 DIAGNOSIS — T8579XD Infection and inflammatory reaction due to other internal prosthetic devices, implants and grafts, subsequent encounter: Secondary | ICD-10-CM | POA: Diagnosis not present

## 2021-12-13 DIAGNOSIS — Z9289 Personal history of other medical treatment: Secondary | ICD-10-CM | POA: Diagnosis not present

## 2021-12-13 DIAGNOSIS — R569 Unspecified convulsions: Secondary | ICD-10-CM | POA: Diagnosis not present

## 2021-12-13 DIAGNOSIS — Z8673 Personal history of transient ischemic attack (TIA), and cerebral infarction without residual deficits: Secondary | ICD-10-CM | POA: Diagnosis not present

## 2021-12-13 DIAGNOSIS — Z789 Other specified health status: Secondary | ICD-10-CM | POA: Diagnosis not present

## 2021-12-13 NOTE — Patient Outreach (Signed)
[facility]) Care Management ? ?12/13/2021 ? ?[name] ?Jan 23, 1946 ?[id number] ? ? ?[place] coordination and Patient Unsuccessful outreach ? ?RN CM received update on 12/12/21 from Neurology NP Fara Olden that Mr Barcellos follow up Neurology appointment after the start of his Repatha is recommended to be done in June 2023 ? ?Outreach attempt to the listed preferred outreach number in [facility] [phone] ?No answer. THN RN CM left HIPAA [facility] Portability and Accountability Act) compliant voicemail message along with CM?s contact info.  ? ?Checked EPIC to confirm he did receive a call from Neurology to schedule his follow up appointment for March 09 2022 ? ?Requested Mr Scalia return a call if concerns with the scheduled neurology appointment. Mention completion of transition of care outreaches as discussed on last week and further THN progression ? ?Plan: ?THN RN CM scheduled this patient for another call attempt within 30+ business days ?Unsuccessful outreach on 12/13/21 ? ? ?Kristyl Athens L. Lavina Hamman, RN, BSN, CCM ?[place] Management Care Coordinator ?Office number [phone] ? ?

## 2021-12-27 ENCOUNTER — Other Ambulatory Visit: Payer: Self-pay

## 2021-12-27 DIAGNOSIS — M65332 Trigger finger, left middle finger: Secondary | ICD-10-CM | POA: Diagnosis not present

## 2021-12-27 DIAGNOSIS — G5602 Carpal tunnel syndrome, left upper limb: Secondary | ICD-10-CM | POA: Diagnosis not present

## 2021-12-27 NOTE — Patient Outreach (Signed)
[facility]) Care Management ? ?12/27/2021 ? ?Richard Austin ?[birth year] ?[id number] ? ? ?Telephone outreach to patient to obtain mRS was successfully completed. MRS=1 ? ?[name] ?[place] Management Assistant ?[phone] ? ?

## 2021-12-28 ENCOUNTER — Other Ambulatory Visit: Payer: Self-pay | Admitting: *Deleted

## 2021-12-28 NOTE — Patient Outreach (Signed)
[facility]) Care Management ? ?12/28/2021 ? ?[name] ?[birth year] ?[id number] ? ? ?[place] coordination- pt follow up neurology appointment ? ?Received a message from [place] at [facility] neurology ?Returned a call to her and reviewed request to confirm the upcoming appointment and a courtesy call to the patient ?Mary agreed to call the patient ? ? ?Plan ?THN RN CM will follow up with patient within the next 30 + business days ? ?Lendora Keys L. Lavina Hamman, RN, BSN, CCM ?[place] Management Care Coordinator ?Office number [phone] ? ?

## 2021-12-28 NOTE — Patient Outreach (Signed)
[facility]) Care Management ?Telephonic RN Care Manager Note ? ? ?12/28/2021 ?Name:  Richard Austin MRN:  [id number] DOB:  01/17/1946 ? ?Summary: ?Returned a call to patient after he outreached related to neurological follow up appointment ?He reports he had not received a call from the neurological office about his follow appointment ?He voices concern if this will change as there has been changes prior  ? ? ?Recommendations/Changes made from today's visit: ?Followed up with patient ?Allowed patient to ventilate his feelings ?Updated Richard Austin that it appears the new appointment is 03/09/22 at 8:15  ? ?Outreach to [phone] spoke with Richard Austin ?Inquired if she could check to make sure this was a confirmed appointment with Richard Austin and to have staff return a courtesy call to the patient confirming this appointment as he reported not having a return call.  Left message for a nurse to return a call to RN CM  ? ? ?Subjective: ?Richard Austin is an 76 y.o. year old male who is a primary patient of Richard Morning, DO. The care management team was consulted for assistance with care management and/or care coordination needs.   ? ?Telephonic RN Care Manager completed Telephone Visit today.  ? ?Objective: ? ?Medications Reviewed Today   ? ? Reviewed by Barbaraann Faster, RN (Registered Nurse) on 11/29/21 at 1413  Med List Status: <None>  ? ?Medication Order Taking? Sig Documenting Provider Last Dose Status Informant  ?aspirin EC 81 MG tablet [id number] No Take 81 mg by mouth daily. Swallow whole. [provider] Taking Active Spouse/Significant Other, Self  ?Azelastine HCl 137 MCG/SPRAY SOLN [id number] No  [provider] Taking Active Self  ?cefadroxil (DURICEF) 500 MG capsule [id number] No Take 1 capsule (500 mg total) by mouth 2 (two) times daily. Jabier Mutton, MD Taking Active   ?doxycycline (VIBRA-TABS) 100 MG tablet [id number] No Take 1 tablet (100 mg total) by mouth 2 (two) times  daily. Jabier Mutton, MD Taking Active   ?ezetimibe (ZETIA) 10 MG tablet [id number] No Take 1 tablet (10 mg total) by mouth daily. Janine Ores, NP Taking Active Spouse/Significant Other, Self  ?fluticasone (FLONASE) 50 MCG/ACT nasal spray [id number] No Place 2 sprays into both nostrils 2 (two) times daily as needed for allergies or rhinitis.  [provider] Taking Active Spouse/Significant Other, Self  ?levETIRAcetam (KEPPRA) 500 MG tablet [id number]  Take 1 tablet (500 mg total) by mouth 2 (two) times daily. Take 250 mg in am and 500 mg at night Garvin Fila, MD  Active   ?lisinopril (PRINIVIL,ZESTRIL) 40 MG tablet [id number] No Take 40 mg by mouth every Austin. [provider] Taking Active Spouse/Significant Other, Self  ?Multiple Vitamin (MULTIVITAMIN) tablet [id number] No Take 1 tablet by mouth daily. [provider] Taking Active Spouse/Significant Other, Self  ?omeprazole (PRILOSEC) 40 MG capsule [id number] No Take 40 mg by mouth daily. [provider] Taking Active Spouse/Significant Other, Self  ?Polyvinyl Alcohol-Povidone PF (REFRESH) 1.4-0.6 % SOLN [id number] No Place 1 drop into both eyes daily as needed (dry eyes). [provider] Taking Active Spouse/Significant Other, Self  ?REPATHA SURECLICK 546 MG/ML SOAJ [id number]  Inject 140 mg into the skin every 14 (fourteen) days. Garvin Fila, MD  Active   ?Sodium Chloride Flush (NORMAL SALINE FLUSH) 0.9 % SOLN [id number] No Flush 3 - 5 mls via catheter daily for 20 days Boisseau, Hayley, PA Taking Active   ?tadalafil (CIALIS) 20 MG tablet [id number] No Take 20 mg  by mouth once as needed (very high blood pressure with fluctuations). [provider] Taking Active Spouse/Significant Other, Self  ?tamsulosin (FLOMAX) 0.4 MG CAPS capsule [id number] No Take 0.4 mg by mouth daily. Takes at 2100 [provider] Taking Active Spouse/Significant Other, Self  ? ?  ?  ? ?  ? ? ? ?SDOH:  (Social Determinants of  Health) assessments and interventions performed:  ? ? ?Care Plan ? ?Review of patient past medical history, allergies, medications, health status, including review of consultants reports, laboratory and other test data, was performed as part of comprehensive evaluation for care management services.  ? ?There are no care plans that you recently modified to display for this patient. ?  ? ?Plan: The patient has been provided with contact information for the care management team and has been advised to call with any health related questions or concerns.  ?The care management team will reach out to the patient again over the next 30+ business days. ? ?Lysha Schrade L. Lavina Hamman, RN, BSN, CCM ?[place] Management Care Coordinator ?Office number [phone] ?[facility] number [phone] ?Fax number [phone] ? ? ? ? ? ?

## 2021-12-28 NOTE — Telephone Encounter (Signed)
Kim returned call, stated patient called her today asking if he had follow up with [place]. She looked up his schedule and advised him of his FU in June. He asked that we call him, stated he never heard from [place] about FU. I called patient and informed him a my chart message had been sent on 12/12/21 with his FU details. He stated he never uses my chart. I gave him FU details. Patient verbalized understanding, appreciation. ? ?

## 2021-12-28 NOTE — Telephone Encounter (Signed)
Richard Austin) would like a call from the nurse to confirm that pt's appt will be cancelled again. Appt has been cancelled numerous of times. I want to reassure the pt this appt will not be cancelled. ?

## 2021-12-28 NOTE — Telephone Encounter (Signed)
Called Kim, LVM requesting call back.  ?

## 2022-01-02 ENCOUNTER — Other Ambulatory Visit: Payer: Self-pay

## 2022-01-02 ENCOUNTER — Encounter: Payer: Self-pay | Admitting: Internal Medicine

## 2022-01-02 ENCOUNTER — Ambulatory Visit: Payer: Medicare Other | Admitting: Internal Medicine

## 2022-01-02 ENCOUNTER — Ambulatory Visit: Payer: Medicare Other | Admitting: Adult Health

## 2022-01-02 VITALS — BP 112/64 | HR 92 | Resp 16 | Ht 68.0 in | Wt 172.0 lb

## 2022-01-02 DIAGNOSIS — K659 Peritonitis, unspecified: Secondary | ICD-10-CM | POA: Diagnosis not present

## 2022-01-02 NOTE — Progress Notes (Signed)
? ?   ? ? ? ? ?Patient Active Problem List  ? Diagnosis Date Noted  ? Seizure disorder ([place]) 11/16/2021  ? Abdominal pain 11/16/2021  ? Pulmonary nodule 11/16/2021  ? Normocytic anemia 11/16/2021  ? Hyperlipidemia, unspecified 11/01/2021  ? Myositis 11/01/2021  ? Palpitations 11/01/2021  ? HLD (hyperlipidemia) 11/01/2021  ? History of seizure 11/01/2021  ? Pain in joint of left shoulder 11/01/2021  ? Encounter for orthopedic follow-up care 10/06/2021  ? Seizure-like activity ([place]) 10/02/2021  ? History of cerebrovascular accident (CVA) with residual deficit 10/02/2021  ? Allergic rhinitis 09/26/2021  ? Bladder cancer ([place]) 09/26/2021  ? Chronic kidney disease, stage 3a ([place]) 09/26/2021  ? Dyslipidemia 09/26/2021  ? Erectile dysfunction 09/26/2021  ? GERD (gastroesophageal reflux disease) 09/26/2021  ? Osteoarthritis 09/26/2021  ? Type 2 diabetes mellitus without complications ([place]) 18/84/1660  ? Right wrist pain 09/24/2021  ? Pain of left hand 09/24/2021  ? Cerebral infarction, unspecified ([place]) 09/21/2021  ? Injury of left shoulder 09/19/2021  ? Abscess of groin, right 07/19/2021  ? Subcutaneous abscess 07/18/2021  ? Numbness of hand 07/13/2021  ? Spinal stenosis in cervical region 05/22/2021  ? Neck pain 03/07/2021  ? BPH (benign prostatic hyperplasia) 03/02/2020  ? Diverticulosis of large intestine 03/02/2020  ? SBO (small bowel obstruction) ([place]) 07/06/2018  ? AKI (acute kidney injury) ([place]) 07/06/2018  ? Hyperglycemia 07/06/2018  ? High blood pressure 07/06/2018  ? Leukocytosis 07/06/2018  ? Bladder mass 07/06/2018  ? Pericardial effusion 07/06/2018  ? Hypercalcemia 07/06/2018  ? ? ?Patient's Medications  ?New Prescriptions  ? No medications on file  ?Previous Medications  ? ASPIRIN EC 81 MG TABLET    Take 81 mg by mouth daily. Swallow whole.  ? AZELASTINE HCL 137 MCG/SPRAY SOLN      ? CHLORTHALIDONE (HYGROTON) 25 MG TABLET    Take 25 mg by mouth daily.  ? DOXYCYCLINE (VIBRA-TABS) 100 MG TABLET    Take 1  tablet (100 mg total) by mouth 2 (two) times daily.  ? EZETIMIBE (ZETIA) 10 MG TABLET    Take 1 tablet (10 mg total) by mouth daily.  ? FLUTICASONE (FLONASE) 50 MCG/ACT NASAL SPRAY    Place 2 sprays into both nostrils 2 (two) times daily as needed for allergies or rhinitis.   ? LEVETIRACETAM (KEPPRA) 500 MG TABLET    Take 1 tablet (500 mg total) by mouth 2 (two) times daily. Take 250 mg in am and 500 mg at night  ? LISINOPRIL (PRINIVIL,ZESTRIL) 40 MG TABLET    Take 40 mg by mouth every morning.  ? MULTIPLE VITAMIN (MULTIVITAMIN) TABLET    Take 1 tablet by mouth daily.  ? OMEPRAZOLE (PRILOSEC) 40 MG CAPSULE    Take 40 mg by mouth daily.  ? POLYVINYL ALCOHOL-POVIDONE PF (REFRESH) 1.4-0.6 % SOLN    Place 1 drop into both eyes daily as needed (dry eyes).  ? REPATHA SURECLICK 630 MG/ML SOAJ    Inject 140 mg into the skin every 14 (fourteen) days.  ? TADALAFIL (CIALIS) 20 MG TABLET    Take 20 mg by mouth once as needed (very high blood pressure with fluctuations).  ? TAMSULOSIN (FLOMAX) 0.4 MG CAPS CAPSULE    Take 0.4 mg by mouth daily. Takes at 2100  ?Modified Medications  ? No medications on file  ?Discontinued Medications  ? CEFDINIR PO      ? ? ?Subjective: ?Richard Austin is a 76 y.o. M with PMHx as below presents for hospital  follow-up. Admitted to [facility] 3/7-3/11 for rectus muscle abscess. He was previously admitted in November, 2022 for groin abscess and underwent I&D in OR with Cx+ MRSA and Strep intermedius discharged on cefdinir x5 days. Pt states he took about 10 days of antibiotics, so unclear if MRSA was treated.  ?3/7-3/11 admission showed CT showed R and LE rectus muscle abscess.IR consulted pt underwent US guided aspiration with Cx from both sites(Cx+ strep intermedius and MRSA).  Drain placed in right rectus muscle. Per IR left rectus collection is adjacent to mesh.  General surgery consulted and pt will require reconstructive surgery for mesh and foreign body removable, not advisable in the  setting of recent CVA. Initially placed on vancomycin and ceftriaxone then transitioned to cefdinir and doxycyline to complete 4 weeks of treatment.  ?11/25/21:Reports he is tolerating antibiotics. Denies abdominal pain, fever, chills, N/V/D.  ?Interim: Seen by Dr. Harlow Asa on 4/18, no plans for surgical intervention ?Today 01/02/22: Denies any abdominal pain.completed antibiotic course with cefadroxil and doxycyline a couple weeks ago.  Tolerating doxycyline.  ?Review of Systems: ?Review of Systems  ?All other systems reviewed and are negative. ? ?Past Medical History:  ?Diagnosis Date  ? Arthritis   ? Bladder tumor   ? Diverticulitis   ? Diverticulosis   ? Hypertension   ? 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 2019  ? Pre-diabetes   ? ? ?Social History  ? ?Tobacco Use  ? Smoking status: Former  ?  Packs/day: 1.50  ?  Years: 30.00  ?  Pack years: 45.00  ?  Types: Cigarettes  ? Smokeless tobacco: Never  ? Tobacco comments:  ?  QUIT 15 YRS AGO  ?Vaping Use  ? Vaping Use: Never used  ?Substance Use Topics  ? Alcohol use: Yes  ?  Comment: SELDOM; several shots of bourbon 07/17/2021  ? Drug use: Not Currently  ? ? ?Family History  ?Problem Relation Age of Onset  ? Peptic Ulcer Mother   ? Cirrhosis Mother   ? Alcohol abuse Mother   ? Bladder Cancer Father   ? Throat cancer Maternal Grandfather   ? ? ?Allergies  ?Allergen Reactions  ? Amlodipine Other (See Comments) and Swelling  ?  Muscle tightness, fatigue   ? Amlodipine Besylate   ?  Other reaction(s): Unknown ?Other reaction(s): Unknown ?Other reaction(s): Unknown  ? Nebivolol Swelling  ? Nebivolol Hcl Other (See Comments) and Swelling  ?  Muscle tightness and fatigue  ?Other reaction(s): Unknown ?Other reaction(s): Unknown ?Other reaction(s): Unknown  ? Statins Other (See Comments)  ?  Muscle tightness and fatigue  ?Other reaction(s): Unknown ?Other reaction(s): Myositis, Unknown ?Other reaction(s): Myositis, Unknown  ? Latex Rash  ? ? ?Health Maintenance  ?Topic Date  Due  ? FOOT EXAM  Never done  ? OPHTHALMOLOGY EXAM  Never done  ? Hepatitis C Screening  Never done  ? TETANUS/TDAP  Never done  ? Zoster Vaccines- Shingrix (1 of 2) Never done  ? COLONOSCOPY (Pts 45-6yr Insurance coverage will need to be confirmed)  Never done  ? Pneumonia Vaccine 76 Years old (1 - PCV) Never done  ? COVID-19 Vaccine (4 - Booster for PMerlinseries) 09/22/2020  ? HEMOGLOBIN A1C  03/22/2022  ? INFLUENZA VACCINE  04/11/2022  ? HPV VACCINES  Aged Out  ? ? ?Objective: ? ?Vitals:  ? 01/02/22 1015  ?BP: 112/64  ?Pulse: 92  ?Resp: 16  ?SpO2: 96%  ?Weight: 172 lb (78 kg)  ?Height: '5\' 8"'$  (1.727 m)  ? ?  Body mass index is 26.15 kg/m?. ? ?Physical Exam ?Constitutional:   ?   General: He is not in acute distress. ?   Appearance: He is normal weight. He is not toxic-appearing.  ?HENT:  ?   Head: Normocephalic and atraumatic.  ?   Right Ear: External ear normal.  ?   Left Ear: External ear normal.  ?   Nose: No congestion or rhinorrhea.  ?   Mouth/Throat:  ?   Mouth: Mucous membranes are moist.  ?   Pharynx: Oropharynx is clear.  ?Eyes:  ?   Extraocular Movements: Extraocular movements intact.  ?   Conjunctiva/sclera: Conjunctivae normal.  ?   Pupils: Pupils are equal, round, and reactive to light.  ?Cardiovascular:  ?   Rate and Rhythm: Normal rate and regular rhythm.  ?   Heart sounds: No murmur heard. ?  No friction rub. No gallop.  ?Pulmonary:  ?   Effort: Pulmonary effort is normal.  ?   Breath sounds: Normal breath sounds.  ?Abdominal:  ?   General: Abdomen is flat. Bowel sounds are normal.  ?   Palpations: Abdomen is soft.  ?   Comments: Abdominal scar  ?Musculoskeletal:     ?   General: No swelling. Normal range of motion.  ?   Cervical back: Normal range of motion and neck supple.  ?Skin: ?   General: Skin is warm and dry.  ?Neurological:  ?   General: No focal deficit present.  ?   Mental Status: He is oriented to person, place, and time.  ?Psychiatric:     ?   Mood and Affect: Mood normal.  ? ? ?Lab  Results ?Lab Results  ?Component Value Date  ? WBC 13.7 (H) 11/25/2021  ? HGB 12.0 (L) 11/25/2021  ? HCT 37.2 (L) 11/25/2021  ? MCV 86.9 11/25/2021  ? PLT 414 (H) 11/25/2021  ?  ?Lab Results  ?Component Value D

## 2022-01-03 LAB — COMPLETE METABOLIC PANEL WITH GFR
AG Ratio: 1.7 (calc) (ref 1.0–2.5)
ALT: 17 U/L (ref 9–46)
AST: 15 U/L (ref 10–35)
Albumin: 4.4 g/dL (ref 3.6–5.1)
Alkaline phosphatase (APISO): 70 U/L (ref 35–144)
BUN/Creatinine Ratio: 33 (calc) — ABNORMAL HIGH (ref 6–22)
BUN: 45 mg/dL — ABNORMAL HIGH (ref 7–25)
CO2: 25 mmol/L (ref 20–32)
Calcium: 10.1 mg/dL (ref 8.6–10.3)
Chloride: 106 mmol/L (ref 98–110)
Creat: 1.36 mg/dL — ABNORMAL HIGH (ref 0.70–1.28)
Globulin: 2.6 g/dL (calc) (ref 1.9–3.7)
Glucose, Bld: 148 mg/dL — ABNORMAL HIGH (ref 65–99)
Potassium: 4.5 mmol/L (ref 3.5–5.3)
Sodium: 139 mmol/L (ref 135–146)
Total Bilirubin: 0.3 mg/dL (ref 0.2–1.2)
Total Protein: 7 g/dL (ref 6.1–8.1)
eGFR: 54 mL/min/{1.73_m2} — ABNORMAL LOW (ref >=60)

## 2022-01-03 LAB — C-REACTIVE PROTEIN: CRP: 0.6 mg/L (ref <8.0)

## 2022-01-03 LAB — CBC WITH DIFFERENTIAL/PLATELET
Absolute Monocytes: 538 cells/uL (ref 200–950)
Basophils Absolute: 42 cells/uL (ref 0–200)
Basophils Relative: 0.5 %
Eosinophils Absolute: 176 cells/uL (ref 15–500)
Eosinophils Relative: 2.1 %
HCT: 42.5 % (ref 38.5–50.0)
Hemoglobin: 13.8 g/dL (ref 13.2–17.1)
Lymphs Abs: 1361 cells/uL (ref 850–3900)
MCH: 28.5 pg (ref 27.0–33.0)
MCHC: 32.5 g/dL (ref 32.0–36.0)
MCV: 87.6 fL (ref 80.0–100.0)
MPV: 10.1 fL (ref 7.5–12.5)
Monocytes Relative: 6.4 %
Neutro Abs: 6283 cells/uL (ref 1500–7800)
Neutrophils Relative %: 74.8 %
Platelets: 296 10*3/uL (ref 140–400)
RBC: 4.85 10*6/uL (ref 4.20–5.80)
RDW: 15.4 % — ABNORMAL HIGH (ref 11.0–15.0)
Total Lymphocyte: 16.2 %
WBC: 8.4 10*3/uL (ref 3.8–10.8)

## 2022-01-03 LAB — SEDIMENTATION RATE: Sed Rate: 2 mm/h (ref 0–20)

## 2022-01-17 ENCOUNTER — Telehealth: Payer: Self-pay | Admitting: Neurology

## 2022-01-17 ENCOUNTER — Other Ambulatory Visit: Payer: Self-pay | Admitting: *Deleted

## 2022-01-17 NOTE — Telephone Encounter (Signed)
I spoke to the patient. His last Repatha injection was 01/08/22. He had a significant rash on bilateral hands, arms (right worse than left), along with burning sensations on the tops of his ear lobes. Rash slowly improving but not resolved completely. His PCP directed him to stop Repatha. Denies any other changes in medications, soaps, laundry detergents or lotions. He has not been sick recently.  ? ?He is due for his next injection on 01/21/22. He is going to skip the next dose and continue to monitor the rash. He is still taking Zetia '10mg'$ , one tablet daily. Unable to take statins (intolerable side effects - tried Crestor, Lipitor in past). ? ?He is getting labs completed at the [place] and will have them send over the results.  ? ?He has pending appt w/ Janett Billow 03/09/22.  ?

## 2022-01-17 NOTE — Patient Outreach (Signed)
[facility]) Care Management ?Telephonic RN Care Manager Note ? ? ?01/17/2022 ?Name:  Richard Austin MRN:  [id number] DOB:  December 02, 1945 ? ?Summary: ?Follow up outreach ?Richard Austin is doing well except he reports noting a Rash from [name] ?He has contacted the neurologist office and has discontinue medicine as instructed ?He will monitor as instructed  ? ?He denies other issues and reports he will outreach to RN CM prn  ? ?Recommendations/Changes made from today's visit: ?Assessed for care coordination and disease management needs ?Support and encouragement offered  ? ? ?Subjective: ?Richard Austin is an 76 y.o. year old male who is a primary patient of [name], DO. The care management team was consulted for assistance with care management and/or care coordination needs.   ? ?Telephonic RN Care Manager completed Telephone Visit today.  ? ?Objective: ? ?Medications Reviewed Today   ? ? Reviewed by Barbaraann Faster, RN (Registered Nurse) on 11/29/21 at 1413  Med List Status: <None>  ? ?Medication Order Taking? Sig Documenting Provider Last Dose Status Informant  ?aspirin EC 81 MG tablet [id number] No Take 81 mg by mouth daily. Swallow whole. [provider] Taking Active Spouse/Significant Other, Self  ?Azelastine HCl 137 MCG/SPRAY SOLN [id number] No  [provider] Taking Active Self  ?cefadroxil (DURICEF) 500 MG capsule [id number] No Take 1 capsule (500 mg total) by mouth 2 (two) times daily. Jabier Mutton, MD Taking Active   ?doxycycline (VIBRA-TABS) 100 MG tablet [id number] No Take 1 tablet (100 mg total) by mouth 2 (two) times daily. Jabier Mutton, MD Taking Active   ?ezetimibe (ZETIA) 10 MG tablet [id number] No Take 1 tablet (10 mg total) by mouth daily. Janine Ores, NP Taking Active Spouse/Significant Other, Self  ?fluticasone (FLONASE) 50 MCG/ACT nasal spray [id number] No Place 2 sprays into both nostrils 2 (two) times daily as needed for allergies or rhinitis.   [provider] Taking Active Spouse/Significant Other, Self  ?levETIRAcetam (KEPPRA) 500 MG tablet [id number]  Take 1 tablet (500 mg total) by mouth 2 (two) times daily. Take 250 mg in am and 500 mg at night Garvin Fila, MD  Active   ?lisinopril (PRINIVIL,ZESTRIL) 40 MG tablet [id number] No Take 40 mg by mouth every morning. [provider] Taking Active Spouse/Significant Other, Self  ?Multiple Vitamin (MULTIVITAMIN) tablet [id number] No Take 1 tablet by mouth daily. [provider] Taking Active Spouse/Significant Other, Self  ?omeprazole (PRILOSEC) 40 MG capsule [id number] No Take 40 mg by mouth daily. [provider] Taking Active Spouse/Significant Other, Self  ?Polyvinyl Alcohol-Povidone PF (REFRESH) 1.4-0.6 % SOLN [id number] No Place 1 drop into both eyes daily as needed (dry eyes). [provider] Taking Active Spouse/Significant Other, Self  ?REPATHA SURECLICK 762 MG/ML SOAJ [id number]  Inject 140 mg into the skin every 14 (fourteen) days. Garvin Fila, MD  Active   ?Sodium Chloride Flush (NORMAL SALINE FLUSH) 0.9 % SOLN [id number] No Flush 3 - 5 mls via catheter daily for 20 days Boisseau, Hayley, PA Taking Active   ?tadalafil (CIALIS) 20 MG tablet [id number] No Take 20 mg by mouth once as needed (very high blood pressure with fluctuations). [provider] Taking Active Spouse/Significant Other, Self  ?tamsulosin (FLOMAX) 0.4 MG CAPS capsule [id number] No Take 0.4 mg by mouth daily. Takes at 2100 [provider] Taking Active Spouse/Significant Other, Self  ? ?  ?  ? ?  ? ? ? ?SDOH:  (Social Determinants of Health) assessments and interventions performed:  ? ? ?  Care Plan ? ?Review of patient past medical history, allergies, medications, health status, including review of consultants reports, laboratory and other test data, was performed as part of comprehensive evaluation for care management services.  ? ?There are no care plans that you  recently modified to display for this patient. ?  ? ?Plan: The patient has been provided with contact information for the care management team and has been advised to call with any health related questions or concerns.  ?The care management team will reach out to the patient again over the next 30+ business days. ? ?Briannon Boggio L. Lavina Hamman, RN, BSN, CCM ?[place] Management Care Coordinator ?Office number [phone] ?Main [facility] number [phone] ?Fax number [phone] ? ? ? ? ? ?

## 2022-01-17 NOTE — Telephone Encounter (Signed)
Pt said, stop taking last weekREPATHA SURECLICK 680 MG/ML SOAJ.Second injection; notice little red dots on hand and arm, third injection bad rash right arm. Spoke with PCP, recommended stop the medication.  ?

## 2022-01-19 NOTE — Telephone Encounter (Signed)
I called the patient again. He is agreeable to skip the next injection and monitor the rash. He is not opposed to trying one further injection of Repatha. If rash is an issue again, then he is agreeable to try Praluent. He will keep [place] up-to-date.  ?

## 2022-01-24 DIAGNOSIS — M65332 Trigger finger, left middle finger: Secondary | ICD-10-CM | POA: Diagnosis not present

## 2022-01-24 DIAGNOSIS — G5602 Carpal tunnel syndrome, left upper limb: Secondary | ICD-10-CM | POA: Diagnosis not present

## 2022-01-30 NOTE — Telephone Encounter (Signed)
Pt has called to report the rash has cleared up so that he can now get on a new injectable, please call pt

## 2022-01-30 NOTE — Telephone Encounter (Signed)
I spoke to the patient. He is unwilling to try another injection of Repatha due to the severity of his rash.   He would like to move forward and try Praluent. He is aware we will send this to Dr. Leonie Man for review.

## 2022-01-31 ENCOUNTER — Other Ambulatory Visit: Payer: Self-pay | Admitting: Neurology

## 2022-01-31 MED ORDER — PRALUENT 150 MG/ML ~~LOC~~ SOAJ: 150.0000 mg | SUBCUTANEOUS | 3 refills | Status: DC

## 2022-02-02 ENCOUNTER — Telehealth: Payer: Self-pay | Admitting: Neurology

## 2022-02-02 ENCOUNTER — Telehealth: Payer: Self-pay | Admitting: *Deleted

## 2022-02-02 NOTE — Telephone Encounter (Addendum)
Disregard - duplicate note.

## 2022-02-02 NOTE — Telephone Encounter (Signed)
PA approved. Effective from 02/02/2022 through 02/03/2023.

## 2022-02-02 NOTE — Telephone Encounter (Signed)
[facility] ([facility] Specialist, Mikle Bosworth) calling to verify received PA request for Alirocumab ([place]) 150 MG/ML SOAJ . Would like a call from the nurse.  Reference# [id number]

## 2022-02-02 NOTE — Telephone Encounter (Signed)
PA for Repatha '140mg'$  started on covermymeds (key: [id number]). Pharmacy coverage through [place] [id number]). Decision pending.

## 2022-02-07 ENCOUNTER — Telehealth: Payer: Self-pay | Admitting: Neurology

## 2022-02-07 MED ORDER — PRALUENT 150 MG/ML ~~LOC~~ SOAJ: 150.0000 mg | SUBCUTANEOUS | 3 refills | Status: DC

## 2022-02-07 NOTE — Telephone Encounter (Signed)
Pt called needing his Alirocumab ([place]) 150 MG/ML SOAJ Rx sent and filled at the [facility] Phone: [phone] from now on due to his copay being $90 at the [facility]. and free at the [place]. Please call pt when refill request has been sent in.

## 2022-02-07 NOTE — Telephone Encounter (Signed)
Praluent RX sent to [facility]. I called patient. I advised him of this. Patient verbalized understanding and appreciation.

## 2022-02-16 NOTE — Telephone Encounter (Signed)
I called Evelena Peat back ( [phone]) ext [phone]. He sts there is a form he has faxed to the main fax here at [facility] and we would need to complete and then fax back for the PA to be competed.   The PA originally originated on CMM is not valid unfortunately.   Will be on the look our of this for so we can complete.   I have updated the pt on this as well.

## 2022-02-16 NOTE — Telephone Encounter (Signed)
[facility] Jule Ser Evelena Peat) we do not [employer], we are our own entity. PA approval would have to be completed.

## 2022-02-16 NOTE — Telephone Encounter (Signed)
I called the pt and relayed that I have faxed the details of this PA approval for the Praluent to the [place]. Fax @ [phone], confirmation was received.

## 2022-02-16 NOTE — Telephone Encounter (Signed)
Pt said, [facility] needs PA for Praluent faxed over. Would like a call back to confirm has been sent.

## 2022-02-17 ENCOUNTER — Other Ambulatory Visit: Payer: Self-pay | Admitting: *Deleted

## 2022-02-17 ENCOUNTER — Encounter: Payer: Self-pay | Admitting: *Deleted

## 2022-02-17 NOTE — Patient Outreach (Signed)
[facility]) Care Management Telephonic RN Care Manager Note   02/27/2022 Name:  Richard Austin MRN:  [id number] DOB:  [birth year]  Summary: Follow up Confirms Rapatha allergic reaction after 3 injections Now prescription Praluent ordered but the cost was reported as $90 at local pharmacy He called the [facility] for a better cost but he is now awaiting a pre approval  Has been on Keppra since march 2023 and it is causing fatigue Discussed get up in mornings and complete a few tasks but gets fatigue very quickly in 1-3 hours. Reports no history of seizures prior to stroke. He has difficulty buttoning shirts with left hands, difficulty opening up bottle caps, difficulty picking up certain items like scissors, difficulty picking up coins, decrease motor skills Hurts Had 2 shots in hand for trigger finger that has only help "not so much" At home he uses Baoding balls to attempt to improve finger dexterity, fine motor skills He discusses he is not certain if some of these symptom are related to his surgery before his stroke He did not have the opportunity to get Occupational therapy (OT) He states he will discuss labs with his cardiologist - last seen by [facility] ([place] cardiologist on 11/07/21  He confirms he has received an outreach from his external pharmacy/CRN vendor but he reports he   Recommendations/Changes made from today's visit: Follow up successful Nexlizet discussed Discussed the importance of getting lab (CBC) soon Reviewed A1 c values    09/22/21  08/14/18     07/07/18 Hgb A1c MFr Bld 4.8 - 5.6 % 6.4 High   6.2 High  CM  6.4 High    Discussed use of anti-epileptic medicine for other neurological/neuropathy reason Discussed benefits of OT and possibility of future OT  Deactivated his my chart per his request as he states he does not prefer to use it   Subjective: Richard Austin is an 76 y.o. year old male who is a  primary patient of [name], DO. The care management team was consulted for assistance with care management and/or care coordination needs.    Telephonic RN Care Manager completed Telephone Visit today.   Objective:  Medications Reviewed Today     Reviewed by Barbaraann Faster, RN (Registered Nurse) on 02/17/22 at 21  Med List Status: <None>   Medication Order Taking? Sig Documenting Provider Last Dose Status Informant  Alirocumab (PRALUENT) 150 MG/ML SOAJ [id number]  Inject 150 mg into the skin every 14 (fourteen) days. Garvin Fila, MD  Active   aspirin EC 81 MG tablet [id number] No Take 81 mg by mouth daily. Swallow whole. [provider] Taking Active Spouse/Significant Other, Self  Azelastine HCl 137 MCG/SPRAY SOLN [id number] No  [provider] Taking Active Self  chlorthalidone (HYGROTON) 25 MG tablet [id number] No Take 25 mg by mouth daily. [provider] Taking Active   doxycycline (VIBRA-TABS) 100 MG tablet [id number] No Take 1 tablet (100 mg total) by mouth 2 (two) times daily. Jabier Mutton, MD Taking Active   ezetimibe (ZETIA) 10 MG tablet [id number] No Take 1 tablet (10 mg total) by mouth daily. Janine Ores, NP Taking Active Spouse/Significant Other, Self  fluticasone (FLONASE) 50 MCG/ACT nasal spray [id number] No Place 2 sprays into both nostrils 2 (two) times daily as needed for allergies or rhinitis.  [provider] Taking Active Spouse/Significant Other, Self  levETIRAcetam (KEPPRA) 500 MG tablet [id number] No Take 1 tablet (500 mg total) by mouth 2 (  two) times daily. Take 250 mg in am and 500 mg at night Garvin Fila, MD Taking Active   lisinopril (PRINIVIL,ZESTRIL) 40 MG tablet [id number] No Take 40 mg by mouth every morning. [provider] Taking Active Spouse/Significant Other, Self  Multiple Vitamin (MULTIVITAMIN) tablet [id number] No Take 1 tablet by mouth daily. [provider] Taking Active Spouse/Significant  Other, Self  omeprazole (PRILOSEC) 40 MG capsule [id number] No Take 40 mg by mouth daily. [provider] Taking Active Spouse/Significant Other, Self  Polyvinyl Alcohol-Povidone PF (REFRESH) 1.4-0.6 % SOLN [id number] No Place 1 drop into both eyes daily as needed (dry eyes). [provider] Taking Active Spouse/Significant Other, Self  tadalafil (CIALIS) 20 MG tablet [id number] No Take 20 mg by mouth once as needed (very high blood pressure with fluctuations). [provider] Taking Active Spouse/Significant Other, Self  tamsulosin (FLOMAX) 0.4 MG CAPS capsule [id number] No Take 0.4 mg by mouth daily. Takes at 2100 [provider] Taking Active Spouse/Significant Other, Self             SDOH:  (Social Determinants of Health) assessments and interventions performed:    Care Plan  Review of patient past medical history, allergies, medications, health status, including review of consultants reports, laboratory and other test data, was performed as part of comprehensive evaluation for care management services.   Care Plan : RN Care Manager Plan of Care  Updates made by Barbaraann Faster, RN since 02/27/2022 12:00 AM     Problem: Complex Care Coordination Needs and disease management in patient with stroke, HTN, DM   Priority: High  Onset Date: 09/26/2021     Long-Range Goal: Establish Plan of Care for Management Complex SDOH Barriers, disease management and Care Coordination Needs in patient with stroke, HTN, DM   Start Date: 09/26/2021  This Visit's Progress: On track  Recent Progress: On track  Priority: High  Note:   Current Barriers:  Knowledge Deficits related to plan of care for management of HTN, DMII, and stroke  Care Coordination needs related to Limited education about stroke, HTN, DM II HTN* EMMI stroke red alerts x 2 for appointments (09/26/21 & 10/10/21) resolved 10/10/21 09/21/21 to 09/23/21 admission,  11/15/21 admission for abdominal abscess  with surgery on 11/16/21 -discharge 11/19/21 abdominal wound drain Unsuccessful outreach 12/13/21  RN CM Clinical Goal(s):  Patient will verbalize understanding of plan for management of HTN, DMII, and stroke as evidenced by voiced improved BP, HgA1c,decrease risk for re admission in next 30 business days  through collaboration with Consulting civil engineer, provider, and care team. - Goal met on 10/24/21 but revised with re admission on 11/15/21  Interventions: Further outreach for care coordination, disease management/education Inter-disciplinary care team collaboration (see longitudinal plan of care) Evaluation of current treatment plan related to  self management and patient's adherence to plan as established by provider 10/10/21 Assessed for concerns with scheduling appointments per new Friday 10/07/21 10 am EMMI stroke red alert referral received today 09/30/21 Denies other needs at this time 11/21/21 Outreach to [place] neurology, spoke with Yves Dill about Mr Effertz outreach to RN CM and his need to have a follow up neuro appointment. Chart review. Arianna spoke with staff at the office, cancelled the appointment with neurology NP and sent note to Dr Leonie Man and/or RN to assist with possibly getting pt seen in 1-2 weeks.  Mr Cousineau updated He agrees to follow up with RN CM prn    Diabetes Interventions:  (Status:  Goal on  track:  Yes.) Short Term Goal 11/29/21 Remains stable Assessed patient's understanding of A1c goal: <7% Discussed plans with patient for ongoing care management follow up and provided patient with direct contact information for care management team Lab Results  Component Value Date   HGBA1C 6.4 (H) 09/22/2021   Hypertension Interventions:  (Status:   continues to work on home management ) Long Term Goal Last practice recorded BP readings:  BP Readings from Last 3 Encounters:  11/28/21 (!) 143/80  11/25/21 (!) 143/87  11/19/21 (!) 148/74  Most recent eGFR/CrCl: No results found for: EGFR   No components found for: CRCL  Evaluation of current treatment plan related to hypertension self management and patient's adherence to plan as established by provider Provided education to patient re: stroke prevention, s/s of heart attack and stroke Reviewed medications with patient and discussed importance of compliance Discussed plans with patient for ongoing care management follow up and provided patient with direct contact information for care management team Advised patient, providing education and rationale, to monitor blood pressure daily and record, calling PCP for findings outside established parameters Discussed complications of poorly controlled blood pressure such as heart disease, stroke, circulatory complications, vision complications, kidney impairment, sexual dysfunction Screening for signs and symptoms of depression related to chronic disease state  Assessed social determinant of health barriers 12/02/21 Discussed white coat syndrome - encouraged having BP check towards end of doctor visits HTN education  Stroke:  (Status:Goal on track:  Yes.) Long Term Goal  Updates 02/17/22 found allergic to Repatha after 3 injections, Rx for Praluent to be covered by [facility] pending or may discuss Nexlizet with providers Pt Questions Keppra possible side effect of fatigue He reports continued left hand dexterity and coordination Did not receive HH OT  12/02/21 improving no worsening symptoms Reviewed Importance of taking all medications as prescribed Reviewed Importance of attending all scheduled provider appointments Assessed social determinant of health barriers 11/01/21 assessed for neurology visit 12/02/21 Sent EPIC message to Fara Olden Neurology NP to inquire about follow up appoint per pt inquiry related to [place] 12/13/21 unsuccessful outreach -12/12/21 update from [place] neurology to recommend June follow up appointment after start [place]. Review of chart indicate scheduled on 03/09/22 02/17/22  Discussed option of Nexlizet to discuss with providers prn  Discussed the importance of getting updated labs (CBC) Reviewed his last 3-4 HgA1c values Discussed use of anti-epileptic medicine for other neurological/neuropathy reason Discussed benefits of OT and possibility of future OT  Deactivated his my chart per his request as he states he does not prefer to use it  Patient Goals/Self-Care Activities: Take all medications as prescribed Attend all scheduled provider appointments Perform all self care activities independently  Perform IADL's (shopping, preparing meals, housekeeping, managing finances) independently Call provider office for new concerns or questions  Attended Neurology (Dr Leonie Man) appointment on 11/28/21  Follow Up Plan:  The patient has been provided with contact information for the care management team and has been advised to call with any health related questions or concerns.  The care management team will reach out to the patient again over the next 30 + business  days.       Plan: The patient has been provided with contact information for the care management team and has been advised to call with any health related questions or concerns.  The care management team will reach out to the patient again over the next 30+ business days.  Herson Prichard L. Lavina Hamman, RN, BSN, CCM [facility] Telephonic  Care Management Care Coordinator Office number [phone] Main [facility] number [phone] Fax number [phone]

## 2022-02-20 NOTE — Telephone Encounter (Signed)
I spoke to the patient. He provided the following information:  1) tried rosuvastatin (Crestor) w/ intolerable, severe weakness and fatigue 2) tried atorvastatin (Lipitor) w/ intolerable, severe weakness and fatigue 3) tried Red Yeast Rice w/ intolerable, severe weakness and fatigue 4) tried ezetimbie (Zetia) - LDL remained elevated on this agent alone 5) tried Co Q 10 - LDL remained elevated on this agent alone  6) Repatha - significant rash  Other questions from [place] that patient confirmed the following answers:  1) adverse symptoms at lower doses and resolved after statins were discontinued 2) vitamin D level has been checked by another MD and fell wnl  This information has been documented on [place] form. It has been faxed back to them with a request for an urgent review for Praluent.  Dorthula Rue [place] Kathy BreachDenyse Amass, Community Care Technician [facility]: [phone], ext [phone]  The patient will let us know if he needs further assistance.  FAX: [phone]

## 2022-02-20 NOTE — Telephone Encounter (Signed)
Received paperwork from the [place]. I will need to speak to the patient to confirm his past tried medications and specific reactions. Left message for a return call.

## 2022-02-23 DIAGNOSIS — C674 Malignant neoplasm of posterior wall of bladder: Secondary | ICD-10-CM | POA: Diagnosis not present

## 2022-02-23 DIAGNOSIS — N401 Enlarged prostate with lower urinary tract symptoms: Secondary | ICD-10-CM | POA: Diagnosis not present

## 2022-02-27 ENCOUNTER — Other Ambulatory Visit: Payer: Self-pay | Admitting: Urology

## 2022-03-08 ENCOUNTER — Other Ambulatory Visit: Payer: Self-pay

## 2022-03-08 ENCOUNTER — Encounter (HOSPITAL_COMMUNITY): Payer: Self-pay

## 2022-03-08 ENCOUNTER — Encounter (HOSPITAL_COMMUNITY)
Admission: RE | Admit: 2022-03-08 | Discharge: 2022-03-08 | Disposition: A | Discharge status: Home / Self Care | Payer: Medicare Other | Source: Ambulatory Visit | Attending: Urology | Admitting: Urology

## 2022-03-08 DIAGNOSIS — I10 Essential (primary) hypertension: Secondary | ICD-10-CM | POA: Insufficient documentation

## 2022-03-08 DIAGNOSIS — E119 Type 2 diabetes mellitus without complications: Secondary | ICD-10-CM | POA: Diagnosis not present

## 2022-03-08 DIAGNOSIS — Z01812 Encounter for preprocedural laboratory examination: Secondary | ICD-10-CM | POA: Diagnosis not present

## 2022-03-08 HISTORY — DX: Hyperlipidemia, unspecified: E78.5

## 2022-03-08 LAB — BASIC METABOLIC PANEL
Anion gap: 8 (ref 5–15)
BUN: 36 mg/dL — ABNORMAL HIGH (ref 8–23)
CO2: 24 mmol/L (ref 22–32)
Calcium: 10.3 mg/dL (ref 8.9–10.3)
Chloride: 109 mmol/L (ref 98–111)
Creatinine, Ser: 1.21 mg/dL (ref 0.61–1.24)
GFR, Estimated: >60 mL/min (ref >60)
Glucose, Bld: 127 mg/dL — ABNORMAL HIGH (ref 70–99)
Potassium: 4.4 mmol/L (ref 3.5–5.1)
Sodium: 141 mmol/L (ref 135–145)

## 2022-03-08 LAB — CBC
HCT: 41.6 % (ref 39.0–52.0)
Hemoglobin: 13.5 g/dL (ref 13.0–17.0)
MCH: 28.8 pg (ref 26.0–34.0)
MCHC: 32.5 g/dL (ref 30.0–36.0)
MCV: 88.7 fL (ref 80.0–100.0)
Platelets: 264 10*3/uL (ref 150–400)
RBC: 4.69 MIL/uL (ref 4.22–5.81)
RDW: 15.1 % (ref 11.5–15.5)
WBC: 9 10*3/uL (ref 4.0–10.5)
nRBC: 0 % (ref 0.0–0.2)

## 2022-03-08 LAB — GLUCOSE, CAPILLARY: Glucose-Capillary: 138 mg/dL — ABNORMAL HIGH (ref 70–99)

## 2022-03-08 NOTE — Progress Notes (Addendum)
Anesthesia note:  Bowel prep reminder:NA  PCP - Dr/ Valora Piccolo Cardiologist -Dr. At [facility] Other- Neurologist-Dr. Royal Hawthorn  Chest x-ray - no EKG - 11/15/21-epic Stress Test - no ECHO - 09/22/21-epic Cardiac Cath - no  Pacemaker/ICD device last checked:NA  Sleep Study - no CPAP -   Pt is pre diabetic-yes Fasting Blood Sugar -  Checks Blood Sugar _____  Blood Thinner:ASA 81 mg/  for post stroke Blood Thinner Instructions: Aspirin Instructions:stop 5 days prior to DOS/ Dr. Gloriann Loan Last Dose:03/14/22. Pt will verify with his neurologist on 6/29.  Anesthesia review: yes  Patient denies shortness of breath, fever, cough and chest pain at PAT appointment Pt reports no SOB with any activities. He had a stroke in January and has minimal problems on Lt side.  Patient verbalized understanding of instructions that were given to them at the PAT appointment. Patient was also instructed that they will need to review over the PAT instructions again at home before surgery. yes

## 2022-03-08 NOTE — Progress Notes (Unsigned)
[facility] [address] [place]. [place] [address] [phone]       OFFICE CONSULT NOTE  [name] Date of Birth:  08/16/1946 Medical Record Number:  [id number]   Referring MD:  Forrest Moron, NP  Reason for Referral:  Stroke  HPI:   Update 03/09/2022 JM: Patient returns for follow-up visit after prior initial stroke visit with Dr. Leonie Man 3 months ago.  Overall stable.  Reports left-sided weakness ***.   Remains on keppra ***  Compliant on aspirin, denies side effects.  Was started on Repatha after prior visit due to history of intolerance to multiple statins.  Reports development of rash after 3 injections ***. He was then switched to Praulent which he has been tolerating without side effects.  Repeat lab work ***.  Blood pressure today ***.        Consult visit 11/28/2021 Dr. Leonie Man: [name] is a pleasant 76 Caucasian male seen today for initial office consultation visit for stroke.  History is obtained from the patient and review of electronic medical records and opossum reviewed pertinent available imaging films in PACS.  He has past medical history for hypertension, hyperlipidemia, bladder cancer, gastroesophageal reflux disease, benign prostatic hypertrophy.  He presented on 09/21/2021 with sudden onset of left facial weakness, left arm and leg weakness and slurred speech.  EMS was called and he was brought to the hospital quickly and given IV TNK.  His left hand strength was limited due to carpal tunnel surgery that he had recently had.  Follow-up MRI and CT scan showed hemorrhagic transformation right MCA infarct.  He was kept in the ICU blood pressure is tightly controlled..  CT angiogram of the head and neck showed severe ostial stenosis of the origin of the right vertebral artery and diffuse tortuosity of major vessels but no significant intracranial stenosis or occlusion.  MRI of the brain showed 2.5 x 2.5 to 2.9 cm right posterior frontal  intraparenchymal hematoma with estimated volume of 9 mL.  There was mild localized vasogenic edema without significant midline shift or mass effect.  2D echo showed ejection fraction of 60 to 65%.  Patient subsequently after discharge had an outpatient 30-day heart monitor but no paroxysmal A-fib or significant cardiac arrhythmia was found in the study was done in the [place] system and report is not available for me to review today.  His LDL cholesterol is elevated 137 mg percent and hemoglobin A1c of 6.4.  2D echo showed ejection fraction of 60 to 65% without cardiac source of embolism.  Patient had history of multiple statin intolerance and statin was not added and he was discharged on Zetia 10 mg daily.  On discharge he had the left facial droop and 2/5 strength in the left upper extremity.   Patient states is done well since discharge.  Left-sided strength is almost improved back to baseline.  His facial droop has resolved.  He is hospital course however event complicated by developing multiple abscesses initially in the groin which required drainage and subsequently in the rectus muscle and abdomen and he is undergone prolonged antibiotics and is following up with Dr. Candiss Norse from infectious disease.  Patient also had focal motor seizures during the hospitalization and he has been started on Keppra.  Patient does complain of mild side effects like dizziness and drowsiness and lightheaded feeling.  He is currently taking 500 mg twice daily.  His blood pressure under good control today it is 143/80.  Patient is willing to  try Repatha injections if approved by his insurance.  He has not been driving.     ROS:   14 system review of systems is positive for weakness, abdominal abscess, statin intolerance all other systems negative  PMH:  Past Medical History:  Diagnosis Date   Arthritis    Bladder tumor    x3   Diverticulitis 2007   with blockage   HLD (hyperlipidemia)    Hypertension    Paroxysmal SVT  (supraventricular tachycardia) ([place]) 2001   had ablation   Pre-diabetes    Seizure ([place]) 10/02/2021   Stroke ([place]) 2023   difficulty swollowing, mouth droop anf numbness in hand Left side    Social History:  Social History   Socioeconomic History   Marital status: Married    Spouse name: Theophilus Kinds   Number of children: Not on file   Years of education: Not on file   Highest education level: Not on file  Occupational History   Not on file  Tobacco Use   Smoking status: Former    Packs/day: 1.50    Years: 30.00    Total pack years: 45.00    Types: Cigarettes    Quit date: 2015    Years since quitting: 8.4   Smokeless tobacco: Never   Tobacco comments:    QUIT 15 YRS AGO  Vaping Use   Vaping Use: Never used  Substance and Sexual Activity   Alcohol use: Yes    Comment: SELDOM; several shots of bourbon 07/17/2021   Drug use: Not Currently   Sexual activity: Not on file  Other Topics Concern   Not on file  Social History Narrative   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 [employer]    Has been married 4 times   100 lb dog   Primary right handed    Social Determinants of Health   Financial Resource Strain: Low Risk  (11/29/2021)   Overall Financial Resource Strain (CARDIA)    Difficulty of Paying Living Expenses: Not hard at all  Food Insecurity: No Food Insecurity (11/29/2021)   Hunger Vital Sign    Worried About Running Out of Food in the Last Year: Never true    Ran Out of Food in the Last Year: Never true  Transportation Needs: No Transportation Needs (11/29/2021)   PRAPARE - Hydrologist (Medical): No    Lack of Transportation (Non-Medical): No  Physical Activity: Not on file  Stress: No Stress Concern Present (09/26/2021)   [place]    Feeling of Stress : Only a little  Social Connections: Socially Integrated (11/29/2021)   Social Connection and Isolation Panel [NHANES]     Frequency of Communication with Friends and Family: Twice a week    Frequency of Social Gatherings with Friends and Family: Twice a week    Attends Religious Services: 1 to 4 times per year    Active Member of [employer] or Organizations: Yes    Attends Archivist Meetings: 1 to 4 times per year    Marital Status: Married  Human resources officer Violence: Not At Risk (11/21/2021)   Humiliation, Afraid, Rape, and Kick questionnaire    Fear of Current or Ex-Partner: No    Emotionally Abused: No    Physically Abused: No    Sexually Abused: No    Medications:   Current Outpatient Medications on File Prior to Visit  Medication Sig Dispense Refill   Alirocumab ([place]appen)  150 MG/ML SOAJ Inject 150 mg into the skin every 14 (fourteen) days. 2 mL 3   aspirin EC 81 MG tablet Take 81 mg by mouth daily. Swallow whole.     chlorthalidone (HYGROTON) 25 MG tablet Take 25 mg by mouth daily.     doxycycline (VIBRA-TABS) 100 MG tablet Take 1 tablet (100 mg total) by mouth 2 (two) times daily. 180 tablet 3   ezetimibe (ZETIA) 10 MG tablet Take 1 tablet (10 mg total) by mouth daily. 30 tablet 1   fluticasone (FLONASE) 50 MCG/ACT nasal spray Place 2 sprays into both nostrils 2 (two) times daily as needed for allergies or rhinitis.      levETIRAcetam (KEPPRA) 500 MG tablet Take 1 tablet (500 mg total) by mouth 2 (two) times daily. Take 250 mg in am and 500 mg at night (Patient taking differently: Take 250-500 mg by mouth See admin instructions. Take 250 mg by mouth in the morning and 500 mg at night) 60 tablet 2   lisinopril (PRINIVIL,ZESTRIL) 40 MG tablet Take 40 mg by mouth every morning.     Multiple Vitamin (MULTIVITAMIN) tablet Take 1 tablet by mouth daily.     omeprazole (PRILOSEC) 40 MG capsule Take 40 mg by mouth daily.     OVER THE COUNTER MEDICATION Take 1 capsule by mouth in the morning, at noon, and at bedtime. Super Beets     Polyvinyl Alcohol-Povidone PF (REFRESH) 1.4-0.6 % SOLN Place 1 drop into  both eyes daily as needed (dry eyes).     tadalafil (CIALIS) 20 MG tablet Take 20 mg by mouth once as needed (very high blood pressure with fluctuations).     tamsulosin (FLOMAX) 0.4 MG CAPS capsule Take 0.4 mg by mouth daily.     No current facility-administered medications on file prior to visit.    Allergies:   Allergies  Allergen Reactions   Amlodipine Other (See Comments) and Swelling    Muscle tightness, fatigue    Nebivolol Hcl Swelling and Other (See Comments)    Muscle tightness and fatigue     Statins Other (See Comments)    Muscle tightness and fatigue  Other reaction(s): Myositis, Unknown    Latex Rash   Repatha [Evolocumab] Rash    Physical Exam There were no vitals filed for this visit. There is no height or weight on file to calculate BMI.  General: well developed, well nourished pleasant elderly Caucasian male, seated, in no evident distress Head: head normocephalic and atraumatic.   Neck: supple with no carotid or supraclavicular bruits Cardiovascular: regular rate and rhythm, no murmurs Musculoskeletal: no deformity Skin:  no rash/petichiae Vascular:  Normal pulses all extremities  Neurologic Exam Mental Status: Awake and fully alert. Oriented to place and time. Recent and remote memory intact. Attention span, concentration and fund of knowledge appropriate. Mood and affect appropriate.  Cranial Nerves: Pupils equal, briskly reactive to light. Extraocular movements full without nystagmus. Visual fields full to confrontation. Hearing intact. Facial sensation intact. Face, tongue, palate moves normally and symmetrically.  Motor: Normal bulk and tone. Normal strength in all tested extremity muscles.  Diminished fine finger movements on the left.  Orbits right over left upper extremity.  Trace left grip weakness. Sensory.: intact to touch , pinprick , position and vibratory sensation.  Coordination: Rapid alternating movements normal in all extremities.  Finger-to-nose and heel-to-shin performed accurately bilaterally. Gait and Station: Arises from chair without difficulty. Stance is normal. Gait demonstrates normal stride length and balance . Able  to heel, toe and tandem walk without difficulty.  Reflexes: 1+ and symmetric. Toes downgoing.       ASSESSMENT/PLAN: 76 year old Caucasian male with right MCA infarct with hemorrhagic transformation s/p treatment with IV TNK in January 2023 who is done remarkably well with practically no residual deficits.  He also had poststroke symptomatic focal seizures which seem well controlled on current dose of Keppra.  Vascular risk factors of hypertension and hyperlipidemia     -Continue aspirin 81 mg daily, Praluent and Zetia 10 mg daily for secondary stroke prevention.  Requires Praluent d/t hx of  -Continue Keppra 250AM/500PM (unable to tolerate higher dose due to drowsiness and lightheadedness) -Discussed avoidance of seizure provoking triggers -Discussed importance of close PCP follow-up for aggressive stroke risk factor management including BP goal<130/90, and HLD with LDL goal<70       CC:  [name], DO   I spent *** minutes of face-to-face and non-face-to-face time with patient.  This included previsit chart review, lab review, study review, order entry, electronic health record documentation, patient education  Frann Rider, [facility]  [facility] [address] [place], [place] [address]  Phone [phone] Fax [phone] Note: This document was prepared with digital dictation and possible smart phrase technology. Any transcriptional errors that result from this process are unintentional.

## 2022-03-09 ENCOUNTER — Ambulatory Visit (INDEPENDENT_AMBULATORY_CARE_PROVIDER_SITE_OTHER): Payer: No Typology Code available for payment source | Admitting: Adult Health

## 2022-03-09 ENCOUNTER — Encounter: Payer: Self-pay | Admitting: Adult Health

## 2022-03-09 VITALS — BP 130/71 | HR 90 | Ht 68.0 in | Wt 174.0 lb

## 2022-03-09 DIAGNOSIS — I639 Cerebral infarction, unspecified: Secondary | ICD-10-CM | POA: Diagnosis not present

## 2022-03-09 DIAGNOSIS — R29898 Other symptoms and signs involving the musculoskeletal system: Secondary | ICD-10-CM | POA: Diagnosis not present

## 2022-03-09 DIAGNOSIS — M791 Myalgia, unspecified site: Secondary | ICD-10-CM

## 2022-03-09 DIAGNOSIS — R569 Unspecified convulsions: Secondary | ICD-10-CM

## 2022-03-09 DIAGNOSIS — I69398 Other sequelae of cerebral infarction: Secondary | ICD-10-CM | POA: Diagnosis not present

## 2022-03-09 DIAGNOSIS — T466X5A Adverse effect of antihyperlipidemic and antiarteriosclerotic drugs, initial encounter: Secondary | ICD-10-CM

## 2022-03-09 LAB — HEMOGLOBIN A1C
Hgb A1c MFr Bld: 6.8 % — ABNORMAL HIGH (ref 4.8–5.6)
Mean Plasma Glucose: 148 mg/dL

## 2022-03-09 MED ORDER — LEVETIRACETAM 250 MG PO TABS: 250.0000 mg | ORAL_TABLET | Freq: Two times a day (BID) | ORAL | 5 refills | Status: DC

## 2022-03-09 NOTE — Patient Instructions (Addendum)
Referral placed for occupational therapy at [facility]   Decrease keppra to '250mg'$  twice daily - will further discuss plan with Dr. Leonie Man - may need to consider switching to a different agent but will keep you updated. Okay to slowly return back to driving, avoid highway and night time driving  Continue aspirin 81 mg daily  and Praluent  for secondary stroke prevention  Continue to follow up with PCP regarding cholesterol and blood pressure management  Maintain strict control of hypertension with blood pressure goal below 130/90 and cholesterol with LDL cholesterol (bad cholesterol) goal below 70 mg/dL.   Signs of a Stroke? Follow the BEFAST method:  Balance Watch for a sudden loss of balance, trouble with coordination or vertigo Eyes Is there a sudden loss of vision in one or both eyes? Or double vision?  Face: Ask the person to smile. Does one side of the face droop or is it numb?  Arms: Ask the person to raise both arms. Does one arm drift downward? Is there weakness or numbness of a leg? Speech: Ask the person to repeat a simple phrase. Does the speech sound slurred/strange? Is the person confused ? Time: If you observe any of these signs, call 911.     Followup in the future with me in 6 months or call earlier if needed       Thank you for coming to see [place] at [facility]. I hope we have been able to provide you high quality care today.  You may receive a patient satisfaction survey over the next few weeks. We would appreciate your feedback and comments so that we may continue to improve ourselves and the health of our patients.

## 2022-03-13 ENCOUNTER — Telehealth: Payer: Self-pay | Admitting: Adult Health

## 2022-03-13 NOTE — Telephone Encounter (Signed)
Referral for OT sent to [facility] [phone].

## 2022-03-19 NOTE — Anesthesia Preprocedure Evaluation (Signed)
Anesthesia Evaluation  Patient identified by MRN, date of birth, ID band Patient awake    Reviewed: Allergy & Precautions, NPO status , Patient's Chart, lab work & pertinent test results  Airway Mallampati: II  TM Distance: >3 FB Neck ROM: Full    Dental no notable dental hx.    Pulmonary former smoker,    Pulmonary exam normal breath sounds clear to auscultation       Cardiovascular hypertension, Normal cardiovascular exam+ dysrhythmias (s/p ablation in 2001) Supra Ventricular Tachycardia  Rhythm:Regular Rate:Normal     Neuro/Psych Seizures - (on keppra),  CVA (on aspirin; difficulty swallowing), Residual Symptoms negative psych ROS   GI/Hepatic GERD  ,  Endo/Other  diabetes  Renal/GU Renal Insufficiency and CRFRenal disease   Bladder tumor    Musculoskeletal  (+) Arthritis , Osteoarthritis,    Abdominal   Peds  Hematology   Anesthesia Other Findings   Reproductive/Obstetrics                            Anesthesia Physical Anesthesia Plan  ASA: 3  Anesthesia Plan: General   Post-op Pain Management:    Induction: Intravenous  PONV Risk Score and Plan: 2 and Treatment may vary due to age or medical condition, Dexamethasone and Ondansetron  Airway Management Planned: LMA  Additional Equipment: None  Intra-op Plan:   Post-operative Plan: Extubation in OR  Informed Consent: I have reviewed the patients History and Physical, chart, labs and discussed the procedure including the risks, benefits and alternatives for the proposed anesthesia with the patient or authorized representative who has indicated his/her understanding and acceptance.     Dental advisory given  Plan Discussed with: Anesthesiologist and CRNA  Anesthesia Plan Comments:        Anesthesia Quick Evaluation

## 2022-03-20 ENCOUNTER — Ambulatory Visit (HOSPITAL_COMMUNITY): Payer: No Typology Code available for payment source | Admitting: Physician Assistant

## 2022-03-20 ENCOUNTER — Encounter (HOSPITAL_COMMUNITY): Payer: Self-pay | Admitting: Urology

## 2022-03-20 ENCOUNTER — Encounter (HOSPITAL_COMMUNITY)
Admission: RE | Disposition: A | Discharge status: Home / Self Care | Payer: Self-pay | Source: Ambulatory Visit | Attending: Urology

## 2022-03-20 ENCOUNTER — Ambulatory Visit (HOSPITAL_BASED_OUTPATIENT_CLINIC_OR_DEPARTMENT_OTHER): Payer: No Typology Code available for payment source | Admitting: Anesthesiology

## 2022-03-20 ENCOUNTER — Ambulatory Visit (HOSPITAL_COMMUNITY)
Admission: RE | Admit: 2022-03-20 | Discharge: 2022-03-20 | Disposition: A | Discharge status: Home / Self Care | Payer: No Typology Code available for payment source | Source: Ambulatory Visit | Attending: Urology | Admitting: Urology

## 2022-03-20 ENCOUNTER — Ambulatory Visit (HOSPITAL_COMMUNITY): Payer: No Typology Code available for payment source

## 2022-03-20 DIAGNOSIS — E1122 Type 2 diabetes mellitus with diabetic chronic kidney disease: Secondary | ICD-10-CM | POA: Diagnosis not present

## 2022-03-20 DIAGNOSIS — M1990 Unspecified osteoarthritis, unspecified site: Secondary | ICD-10-CM | POA: Insufficient documentation

## 2022-03-20 DIAGNOSIS — N1831 Chronic kidney disease, stage 3a: Secondary | ICD-10-CM

## 2022-03-20 DIAGNOSIS — D494 Neoplasm of unspecified behavior of bladder: Secondary | ICD-10-CM

## 2022-03-20 DIAGNOSIS — Z8551 Personal history of malignant neoplasm of bladder: Secondary | ICD-10-CM | POA: Diagnosis not present

## 2022-03-20 DIAGNOSIS — I10 Essential (primary) hypertension: Secondary | ICD-10-CM

## 2022-03-20 DIAGNOSIS — I129 Hypertensive chronic kidney disease with stage 1 through stage 4 chronic kidney disease, or unspecified chronic kidney disease: Secondary | ICD-10-CM

## 2022-03-20 DIAGNOSIS — Z87891 Personal history of nicotine dependence: Secondary | ICD-10-CM

## 2022-03-20 DIAGNOSIS — Z79899 Other long term (current) drug therapy: Secondary | ICD-10-CM | POA: Insufficient documentation

## 2022-03-20 DIAGNOSIS — E119 Type 2 diabetes mellitus without complications: Secondary | ICD-10-CM

## 2022-03-20 DIAGNOSIS — Z8673 Personal history of transient ischemic attack (TIA), and cerebral infarction without residual deficits: Secondary | ICD-10-CM | POA: Insufficient documentation

## 2022-03-20 DIAGNOSIS — N184 Chronic kidney disease, stage 4 (severe): Secondary | ICD-10-CM | POA: Diagnosis not present

## 2022-03-20 DIAGNOSIS — D303 Benign neoplasm of bladder: Secondary | ICD-10-CM | POA: Insufficient documentation

## 2022-03-20 DIAGNOSIS — N189 Chronic kidney disease, unspecified: Secondary | ICD-10-CM | POA: Diagnosis not present

## 2022-03-20 DIAGNOSIS — R569 Unspecified convulsions: Secondary | ICD-10-CM | POA: Diagnosis not present

## 2022-03-20 HISTORY — PX: TRANSURETHRAL RESECTION OF BLADDER TUMOR WITH MITOMYCIN-C: SHX6459

## 2022-03-20 SURGERY — TRANSURETHRAL RESECTION OF BLADDER TUMOR WITH MITOMYCIN-C
Anesthesia: General | Site: Bladder | Laterality: Bilateral

## 2022-03-20 MED ORDER — LIDOCAINE HCL (PF) 2 % IJ SOLN
INTRAMUSCULAR | Status: AC
  Filled 2022-03-20: qty 5

## 2022-03-20 MED ORDER — ORAL CARE MOUTH RINSE: 15.0000 mL | Freq: Once | OROMUCOSAL | Status: AC

## 2022-03-20 MED ORDER — SODIUM CHLORIDE 0.9 % IR SOLN
Status: DC | PRN
  Administered 2022-03-20: 3000 mL

## 2022-03-20 MED ORDER — DEXAMETHASONE SODIUM PHOSPHATE 10 MG/ML IJ SOLN
INTRAMUSCULAR | Status: AC
  Filled 2022-03-20: qty 1

## 2022-03-20 MED ORDER — OXYCODONE HCL 5 MG/5ML PO SOLN: 5.0000 mg | Freq: Once | ORAL | Status: DC | PRN

## 2022-03-20 MED ORDER — CHLORHEXIDINE GLUCONATE 0.12 % MT SOLN
15.0000 mL | Freq: Once | OROMUCOSAL | Status: AC
  Administered 2022-03-20: 15 mL via OROMUCOSAL

## 2022-03-20 MED ORDER — 0.9 % SODIUM CHLORIDE (POUR BTL) OPTIME
TOPICAL | Status: DC | PRN
  Administered 2022-03-20: 1000 mL

## 2022-03-20 MED ORDER — ROCURONIUM BROMIDE 100 MG/10ML IV SOLN
INTRAVENOUS | Status: DC | PRN
  Administered 2022-03-20: 70 mg via INTRAVENOUS

## 2022-03-20 MED ORDER — CEFAZOLIN SODIUM-DEXTROSE 2-4 GM/100ML-% IV SOLN
2.0000 g | INTRAVENOUS | Status: AC
  Administered 2022-03-20: 2 g via INTRAVENOUS
  Filled 2022-03-20: qty 100

## 2022-03-20 MED ORDER — OXYCODONE HCL 5 MG PO TABS: 5.0000 mg | ORAL_TABLET | Freq: Once | ORAL | Status: DC | PRN

## 2022-03-20 MED ORDER — ONDANSETRON HCL 4 MG/2ML IJ SOLN
INTRAMUSCULAR | Status: AC
  Filled 2022-03-20: qty 2

## 2022-03-20 MED ORDER — DEXAMETHASONE SODIUM PHOSPHATE 10 MG/ML IJ SOLN
INTRAMUSCULAR | Status: DC | PRN
  Administered 2022-03-20: 8 mg via INTRAVENOUS

## 2022-03-20 MED ORDER — FENTANYL CITRATE (PF) 100 MCG/2ML IJ SOLN
INTRAMUSCULAR | Status: DC | PRN
Start: 2022-03-20 — End: 2022-03-20
  Administered 2022-03-20 (×2): 50 ug via INTRAVENOUS

## 2022-03-20 MED ORDER — ROCURONIUM BROMIDE 10 MG/ML (PF) SYRINGE
PREFILLED_SYRINGE | INTRAVENOUS | Status: AC
  Filled 2022-03-20: qty 10

## 2022-03-20 MED ORDER — ACETAMINOPHEN 10 MG/ML IV SOLN
1000.0000 mg | Freq: Once | INTRAVENOUS | Status: AC
Start: 2022-03-20 — End: 2022-03-20
  Administered 2022-03-20: 1000 mg via INTRAVENOUS
  Filled 2022-03-20: qty 100

## 2022-03-20 MED ORDER — FENTANYL CITRATE PF 50 MCG/ML IJ SOSY: 25.0000 ug | PREFILLED_SYRINGE | INTRAMUSCULAR | Status: DC | PRN

## 2022-03-20 MED ORDER — SUGAMMADEX SODIUM 200 MG/2ML IV SOLN
INTRAVENOUS | Status: DC | PRN
  Administered 2022-03-20: 200 mg via INTRAVENOUS

## 2022-03-20 MED ORDER — FENTANYL CITRATE (PF) 100 MCG/2ML IJ SOLN
INTRAMUSCULAR | Status: AC
  Filled 2022-03-20: qty 2

## 2022-03-20 MED ORDER — TRAMADOL HCL 50 MG PO TABS: 50.0000 mg | ORAL_TABLET | Freq: Four times a day (QID) | ORAL | 0 refills | Status: AC | PRN

## 2022-03-20 MED ORDER — ONDANSETRON HCL 4 MG/2ML IJ SOLN: 4.0000 mg | Freq: Once | INTRAMUSCULAR | Status: DC | PRN

## 2022-03-20 MED ORDER — PROPOFOL 1000 MG/100ML IV EMUL
INTRAVENOUS | Status: AC
  Filled 2022-03-20: qty 100

## 2022-03-20 MED ORDER — LIDOCAINE HCL (CARDIAC) PF 100 MG/5ML IV SOSY
PREFILLED_SYRINGE | INTRAVENOUS | Status: DC | PRN
  Administered 2022-03-20: 80 mg via INTRAVENOUS

## 2022-03-20 MED ORDER — PROPOFOL 10 MG/ML IV BOLUS
INTRAVENOUS | Status: DC | PRN
  Administered 2022-03-20: 150 mg via INTRAVENOUS

## 2022-03-20 MED ORDER — PROPOFOL 10 MG/ML IV BOLUS
INTRAVENOUS | Status: AC
  Filled 2022-03-20: qty 20

## 2022-03-20 MED ORDER — GEMCITABINE CHEMO FOR BLADDER INSTILLATION 2000 MG
2000.0000 mg | Freq: Once | INTRAVENOUS | Status: AC
  Administered 2022-03-20: 2000 mg via INTRAVESICAL
  Filled 2022-03-20: qty 2000

## 2022-03-20 MED ORDER — AMISULPRIDE (ANTIEMETIC) 5 MG/2ML IV SOLN: 10.0000 mg | Freq: Once | INTRAVENOUS | Status: DC | PRN

## 2022-03-20 MED ORDER — LACTATED RINGERS IV SOLN: INTRAVENOUS | Status: DC

## 2022-03-20 SURGICAL SUPPLY — 19 items
BAG URINE DRAIN 2000ML AR STRL (UROLOGICAL SUPPLIES) ×1 IMPLANT
BAG URO CATCHER STRL LF (MISCELLANEOUS) ×2 IMPLANT
CATH FOLEY 2WAY SLVR  5CC 18FR (CATHETERS)
CATH FOLEY 2WAY SLVR 5CC 18FR (CATHETERS) IMPLANT
DRAPE FOOT SWITCH (DRAPES) ×2 IMPLANT
ELECT REM PT RETURN 15FT ADLT (MISCELLANEOUS) ×2 IMPLANT
GLOVE BIO SURGEON STRL SZ7.5 (GLOVE) ×2 IMPLANT
GOWN STRL REUS W/ TWL XL LVL3 (GOWN DISPOSABLE) ×1 IMPLANT
GOWN STRL REUS W/TWL XL LVL3 (GOWN DISPOSABLE) ×2
GUIDEWIRE STR DUAL SENSOR (WIRE) ×1 IMPLANT
KIT TURNOVER KIT A (KITS) IMPLANT
LOOP CUT BIPOLAR 24F LRG (ELECTROSURGICAL) ×1 IMPLANT
MANIFOLD NEPTUNE II (INSTRUMENTS) ×2 IMPLANT
PACK CYSTO (CUSTOM PROCEDURE TRAY) ×2 IMPLANT
PLUG CATH AND CAP STER (CATHETERS) ×1 IMPLANT
SYR TOOMEY IRRIG 70ML (MISCELLANEOUS) ×2
SYRINGE TOOMEY IRRIG 70ML (MISCELLANEOUS) IMPLANT
TUBING CONNECTING 10 (TUBING) ×2 IMPLANT
TUBING UROLOGY SET (TUBING) ×2 IMPLANT

## 2022-03-20 NOTE — H&P (Signed)
CC/HPI: Cc: Bladder tumor  HPI:  07/25/2018:  76 year old male underwent a CT scan of the abdomen and pelvis without contrast. This revealed a 3.5 cm calcified bladder tumor. There was no hydronephrosis. He also had a small bowel obstruction at the time. He denies any hematuria or dysuria. Urinalysis is negative today. He also complains of erectile dysfunction. He has taken Viagra in the past which worked well for him. Cialis does not work as well for him. He does not take nitrates for chest pain.    08/28/2018:  Patient is status post TURBT on 08/21/2018. This revealed focally invasive high-grade urothelial cell carcinoma invading the lamina propria. Detrusor muscle was present and not involved. The patient has been doing well since surgery.    02/20/2019  Patient completed BCG. He had no problems with this. He tolerated it well. No voiding complaint. No interval hematuria.    08/15/2019  Patient underwent a CT IVP. This revealed no evidence of genitourinary abnormality. He presents for cystoscopy.    03/18/2020  Patient denies any interval gross hematuria. He presents for surveillance cystoscopy. He does state that in the interval, lower urinary tract symptoms have gotten a little bit worse. He has nocturia x2, mildly weak stream. His primary care physician started him on Flomax twice daily but he did not like the side effects. He would like to try different medication. He currently he remains on Flomax 0.4 mg daily    04/30/2020  Patient is status post TURBT with bilateral retrograde pyelogram. Bilateral retrograde pyelograms were normal. Pathology revealed early noninvasive high-grade urothelial cell carcinoma. No muscularis was present. All tumor was resected. He has no complaints today.    11/11/2020  Patient completed repeat induction BCG. Last cystoscopy was negative. Urinalysis negative today. He has occasional left lower back pain. Denies hematuria associated with it. Sounds more like  musculoskeletal pain rather than renal colic. He presents for cystoscopy today.    01/10/2021: 76 year old male who presents with gross hematuria, urinary frequency, dysuria, left flank pain and discomfort that is constant. Due to his history of bladder cancer in 3 TURBT use, he is very concerned about his flank pain and his gross hematuria. He has not had any upper tract imaging since 2020. He denies fevers and chills. No complaints of dizziness. Previous cystoscopy was completed on 11/11/2020.    07/14/2021  Patient here for surveillance cystoscopy. Did well with maintenance BCG x3.    10/13/2021  Patient had to have incision and drainage of his suprapubic area because of a retained Prolene stitch. He then had have carpal tunnel surgery. He also had a stroke and was treated with tPA and his carpal tunnel incision bled. He had a seizure. He is starting to recover and is doing better at this moment however. He is due for surveillance cystoscopy.    02/23/2022  Patient is status post maintenance BCG x3 which is his second maintenance. He presents for surveillance cystoscopy.      ALLERGIES: Amlodipine Bystolic Latex repatha Statins      MEDICATIONS: Aspirin 81 mg tablet,chewable  Lisinopril 40 mg tablet  Omeprazole  Chlorthalidone 25 mg tablet  Flomax  Ibuprofen 800 mg tablet  Keppra 500 mg tablet  Zetia      GU PSH: Bladder Instill AntiCA Agent - 11/01/2021, 10/25/2021, 10/18/2021, 05/24/2021, 05/17/2021, 05/10/2021, 02/09/2021, 02/02/2021, 01/26/2021, 06/28/2020, 06/21/2020, 06/14/2020, 06/07/2020, 05/31/2020, 05/24/2020, 2020, 2020, 2020, 2020, 2020, 2020, 2019 Catheterize For Residual - 2020 Cysto Bladder Ureth Biopsy - 2020 Cystoscopy -  10/13/2021, 07/14/2021, 03/18/2021, 11/11/2020, 08/11/2020, 03/18/2020, 2021, 2020, 2020, 2020, 2019 Cystoscopy Fulguration - 12/20/2020 Cystoscopy TURBT <2 cm - 04/23/2020 Cystoscopy TURBT 2-5 cm - 2019 Locm 300-'399Mg'$ /Ml Iodine,1Ml - 2020, 2019        PSH Notes:  Correct Peyronie's    abdomen procedure/cyst removed 2020    NON-GU PSH: Bowel Surgery Procedure Carpal tunnel surgery, Left Hernia Repair       GU PMH: Bladder Cancer Posterior - 11/01/2021, - 10/25/2021, - 10/18/2021, - 10/13/2021, - 07/14/2021, - 05/24/2021, - 05/17/2021, - 05/10/2021, - 03/18/2021, - 02/09/2021, - 02/02/2021, - 01/26/2021, - 01/18/2021, - 01/10/2021, - 12/20/2020, - 11/11/2020, - 08/11/2020, - 06/28/2020, - 06/21/2020, - 06/14/2020, - 06/07/2020, - 05/31/2020, - 05/24/2020, - 2019 Chronic kidney disease stage 4 (GFR 15-30) - 03/18/2021 Flank Pain - 01/12/2021, - 01/10/2021 Gross hematuria - 01/12/2021, - 01/10/2021 BPH w/LUTS - 08/11/2020, - 03/18/2020 Nocturia - 03/18/2020 Weak Urinary Stream - 03/18/2020 Bladder tumor/neoplasm - 2019 ED due to arterial insufficiency - 2019     NON-GU PMH: Cutaneous abscess of groin - 08/08/2021, - 08/03/2021, - 07/29/2021, - 07/25/2021, - 07/21/2021 Arthritis GERD Hypercholesterolemia Hypertension Seizure disorder Stroke/TIA     FAMILY HISTORY: 1 Daughter - Other 1 son - Other Bladder Cancer - Father    SOCIAL HISTORY: Marital Status: Married Preferred Language: English Current Smoking Status: Patient does not smoke anymore. Has not smoked since 07/13/2003.    Tobacco Use Assessment Completed: Used Tobacco in last 30 days? Drinks 2 caffeinated drinks per day.     REVIEW OF SYSTEMS:    GU Review Male:   Patient denies frequent urination, hard to postpone urination, burning/ pain with urination, get up at night to urinate, leakage of urine, stream starts and stops, trouble starting your stream, have to strain to urinate , erection problems, and penile pain.  Gastrointestinal (Upper):   Patient denies nausea, vomiting, and indigestion/ heartburn.  Gastrointestinal (Lower):   Patient denies diarrhea and constipation.  Constitutional:   Patient denies fatigue, weight loss, night sweats, and fever.  Skin:   Patient denies skin rash/ lesion and itching.  Eyes:    Patient denies blurred vision and double vision.  Ears/ Nose/ Throat:   Patient denies sore throat and sinus problems.  Hematologic/Lymphatic:   Patient denies swollen glands and easy bruising.  Cardiovascular:   Patient denies leg swelling and chest pains.  Respiratory:   Patient denies cough and shortness of breath.  Endocrine:   Patient denies excessive thirst.  Musculoskeletal:   Patient denies back pain and joint pain.  Neurological:   Patient denies headaches and dizziness.  Psychologic:   Patient denies depression and anxiety.    VITAL SIGNS: None    GU PHYSICAL EXAMINATION:    Penis: Circumcised, no warts, no cracks. No dorsal Peyronie's plaques, no left corporal Peyronie's plaques, no right corporal Peyronie's plaques, no scarring, no warts. No balanitis, no meatal stenosis.    MULTI-SYSTEM PHYSICAL EXAMINATION:    Gastrointestinal: No mass, no tenderness, no rigidity, non obese abdomen.      Complexity of Data:  Source Of History:  Patient  Records Review:   Previous Doctor Records, Previous Patient Records  Urine Test Review:   Urinalysis    PROCEDURES:         Flexible Cystoscopy - 52000  Risks, benefits, and some of the potential complications of the procedure were discussed at length with the patient including infection, bleeding, voiding discomfort, urinary retention, fever, chills, sepsis, and others. All questions were  answered. Informed consent was obtained. Antibiotic prophylaxis was given. Sterile technique and intraurethral analgesia were used.  Meatus:  Normal size. Normal location. Normal condition.  Urethra:  No strictures.  External Sphincter:  Normal.  Verumontanum:  Normal.  Prostate:  Obstructing. Moderate hyperplasia.  Bladder Neck:  Non-obstructing.  Ureteral Orifices:  Normal location. Normal size. Normal shape. Effluxed clear urine. Evidence of prior resection over the left ureteral orifice  Bladder:  No trabeculation. He had a small 1 to 2 cm  superficial papillary bladder tumor close to the left ureteral orifice towards the bladder neck      The lower urinary tract was carefully examined. The procedure was well-tolerated and without complications. Antibiotic instructions were given. Instructions were given to call the office immediately for bloody urine, difficulty urinating, urinary retention, painful or frequent urination, fever, chills, nausea, vomiting or other illness. The patient stated that he understood these instructions and would comply with them.           Urinalysis Dipstick Dipstick Cont'd  Color: Yellow Bilirubin: Neg mg/dL  Appearance: Clear Ketones: Neg mg/dL  Specific Gravity: 1.025 Blood: Neg ery/uL  pH: <=5.0 Protein: Neg mg/dL  Glucose: Neg mg/dL Urobilinogen: 0.2 mg/dL    Nitrites: Neg    Leukocyte Esterase: Neg leu/uL      ASSESSMENT:      ICD-10 Details  1 GU:   Bladder Cancer Posterior - C67.4 Chronic, Stable    PLAN:            Orders Labs Urine Culture            Schedule Procedure: Unspecified Date - Cystoscopy TURBT 2-5 cm - 47829            Document Letter(s):  Created for Patient: Clinical Summary           Notes:   Plan for cystoscopy with bilateral retrograde pyelogram, TURBT with instillation of gemcitabine.    CC: Dr. Theda Sers      Signed by Link Snuffer, III, M.D. on 02/23/22 at 9:32 AM (EDT

## 2022-03-20 NOTE — Discharge Instructions (Signed)

## 2022-03-20 NOTE — Anesthesia Procedure Notes (Signed)
Procedure Name: Intubation Date/Time: 03/20/2022 7:37 AM  Performed by: Latesa Fratto, Forest Gleason, CRNAPre-anesthesia Checklist: Patient identified, Emergency Drugs available, Suction available, Patient being monitored and Timeout performed Patient Re-evaluated:Patient Re-evaluated prior to induction Oxygen Delivery Method: Circle system utilized Preoxygenation: Pre-oxygenation with 100% oxygen Induction Type: IV induction Ventilation: Mask ventilation without difficulty Laryngoscope Size: 4 and Mac Grade View: Grade I Tube type: Oral Tube size: 7.5 mm Number of attempts: 2 (first attempt by paramedic student Deforest Hoyles) Airway Equipment and Method: Stylet Placement Confirmation: ETT inserted through vocal cords under direct vision, positive ETCO2, CO2 detector and breath sounds checked- equal and bilateral Secured at: 23 cm Tube secured with: Tape Dental Injury: Teeth and Oropharynx as per pre-operative assessment

## 2022-03-20 NOTE — Transfer of Care (Signed)
Immediate Anesthesia Transfer of Care Note  Patient: Richard Austin  Procedure(s) Performed: TRANSURETHRAL RESECTION OF BLADDER TUMOR WITH GEMCITABINE BILATERAL RETROGRADE PYELOGRAM right ureteroscopy (Bilateral: Bladder)  Patient Location: PACU  Anesthesia Type:General  Level of Consciousness: awake  Airway & Oxygen Therapy: Patient Spontanous Breathing  Post-op Assessment: Report given to RN  Post vital signs: stable  Last Vitals:  Vitals Value Taken Time  BP 119/54 03/20/22 0825  Temp    Pulse 68 03/20/22 0828  Resp    SpO2 100 % 03/20/22 0828  Vitals shown include unvalidated device data.  Last Pain:  Vitals:   03/20/22 0544  TempSrc:   PainSc: 0-No pain      Patients Stated Pain Goal: 4 (89/34/06 8403)  Complications: No notable events documented.

## 2022-03-20 NOTE — Op Note (Signed)
Operative Note  Preoperative diagnosis:  1.  Bladder tumor  Postoperative diagnosis: 1.  Bladder tumor--medium  Procedure(s): 1.  Cystoscopy with bilateral retrograde pyelogram 2.  Right diagnostic ureteroscopy 3.  Transurethral resection of bladder tumor-status medium 4.  Intravesical instillation of gemcitabine  Surgeon: Link Snuffer, MD  Assistants: None  Anesthesia: General  Complications: None immediate  EBL: Minimal  Specimens: 1.  Bladder tumor  Drains/Catheters: 1.  18 French silicone Foley catheter  Intraoperative findings: 1.  Normal anterior urethra 2.  Moderately obstructing prostate with no significant median lobe. 3.  Left retrograde pyelogram revealed no filling defect and no hydronephrosis. 4.  Right retrograde pyelogram revealed some dilation in the distal ureter with proximal narrowing followed by mild dilation again.  Interrogation with ureteroscopy revealed no lesion.  There were no filling defects in the renal collecting system and no hydronephrosis. 5.  Small lesion at the midline trigone slightly to the right.  There is also some cobblestoning mucosa along the trigone.  Total area of resection was about 2-2.5 cm.  All areas of abnormality treated.  Indication: 76 year old male with a history of bladder cancer found to have concern for recurrence on cystoscopy.  He presents for the previously mentioned operation.  Description of procedure:  The patient was identified and consent was obtained.  The patient was taken to the operating room and placed in the supine position.  The patient was placed under general anesthesia.  Perioperative antibiotics were administered.  The patient was placed in dorsal lithotomy.  Patient was prepped and draped in a standard sterile fashion and a timeout was performed.  A 21 French rigid cystoscope was advanced into the urethra and into the bladder.  Complete cystoscopy was performed with findings noted above.  The left  ureter was cannulated with an open-ended ureteral catheter and a retrograde pyelogram was performed with findings noted above.  Right retrograde pyelogram was then performed.  I noted there was some distal ureteral dilation followed by narrowing more proximally and then dilation again a few centimeters proximal.  For this reason, I elected to proceed with a diagnostic ureteroscopy.  A wire was advanced up the right ureter and into the kidney.  Semirigid ureteroscopy was performed alongside the wire up to the renal pelvis.  No lesions were seen.  There was no stricture.  I withdrew the scope and the wire.  I then advanced a 70 French resectoscope with visual obturator in place into the urethra and into the bladder.  I exchanged for the bipolar working element and resected the area of interest.  I fulgurated the resection bed.  Resection came close to the right ureteral orifice but the right ureteral orifice was not resected.  It was not fulgurated either.  I collected the bladder specimen.  I inspected the bladder mucosa and there was no active bleeding.  I withdrew the scope and placed an 22 French silicone catheter.  This concluded the operation.  Patient tolerated the procedure well and was stable postoperatively.  In the PACU, I instilled gemcitabine into the bladder where it remained for approximately 1 hour prior to proper disposal.  Plan: Follow-up in 1 week for pathology review

## 2022-03-20 NOTE — Anesthesia Postprocedure Evaluation (Signed)
Anesthesia Post Note  Patient: Richard Austin  Procedure(s) Performed: TRANSURETHRAL RESECTION OF BLADDER TUMOR WITH GEMCITABINE BILATERAL RETROGRADE PYELOGRAM right ureteroscopy (Bilateral: Bladder)     Patient location during evaluation: PACU Anesthesia Type: General Level of consciousness: awake Pain management: pain level controlled Vital Signs Assessment: post-procedure vital signs reviewed and stable Respiratory status: spontaneous breathing and respiratory function stable Cardiovascular status: stable Postop Assessment: no apparent nausea or vomiting Anesthetic complications: no   No notable events documented.  Last Vitals:  Vitals:   03/20/22 0930 03/20/22 0945  BP: 124/67 119/68  Pulse: 64 63  Resp: 11 13  Temp:    SpO2: 97% 98%    Last Pain:  Vitals:   03/20/22 0945  TempSrc:   PainSc: 0-No pain                 Merlinda Frederick

## 2022-03-21 ENCOUNTER — Encounter (HOSPITAL_COMMUNITY): Payer: Self-pay | Admitting: Urology

## 2022-03-21 LAB — SURGICAL PATHOLOGY

## 2022-03-23 NOTE — Telephone Encounter (Signed)
Pt spoke with the [facility] and was told do not have a referral for him. Would like a call back.

## 2022-03-27 ENCOUNTER — Telehealth: Payer: Self-pay | Admitting: Adult Health

## 2022-03-27 MED ORDER — LEVETIRACETAM 250 MG PO TABS: 250.0000 mg | ORAL_TABLET | Freq: Two times a day (BID) | ORAL | 5 refills | Status: DC

## 2022-03-27 NOTE — Telephone Encounter (Signed)
Corrected keppra rx as he should be currently taking '250mg'$  BID. Unsure if they will require [place] to fax a new rx.   Dr. Leonie Man, at recent visit with patient, he had continued concerns of fatigue despite previously lowering dosage. He wishes to completely stop medication although is agreeable to trial a different medication if recommended. Has only had 1 seizure back in January consistent of left arm and face jerking movements.  please advise. Thank you.

## 2022-03-27 NOTE — Telephone Encounter (Signed)
Linda from [facility] is calling and have question about the levETIRAcetam (KEPPRA) 250 MG tablet.

## 2022-03-27 NOTE — Telephone Encounter (Signed)
[facility], spoke with [place] who transferred call to [place]. I clarified levetiracetam Rx: take 250 mg twice daily. She verbalized understanding, appreciation.

## 2022-03-28 ENCOUNTER — Encounter: Payer: Self-pay | Admitting: *Deleted

## 2022-03-28 MED ORDER — DIVALPROEX SODIUM ER 500 MG PO TB24: ORAL_TABLET | ORAL | 0 refills | Status: DC

## 2022-03-28 MED ORDER — DIVALPROEX SODIUM ER 500 MG PO TB24: 500.0000 mg | ORAL_TABLET | Freq: Two times a day (BID) | ORAL | 5 refills | Status: DC

## 2022-03-28 NOTE — Addendum Note (Signed)
Addended by: Frann Rider L on: 03/28/2022 07:25 AM   Modules accepted: Orders

## 2022-03-28 NOTE — Telephone Encounter (Signed)
Please advise patient that Dr. Leonie Man recommends switching to a different agent as he does remain at risk for seizures due to his stroke history. He recommends switching to Depakote ER based on schedule below:   Once picks up Depakote, he will take both Depakote ER '500mg'$  qhs in addition to Keppra 250 mg BID - continue this for 2 weeks After 2 week, he will increase Depakote ER to '500mg'$  BID and decrease keppra to '250mg'$  nightly for 1 month.  After 1 month, he can stop keppra and remain on Depakote ER 500 mg BID   If side effects are to occur on Depakote, most common could be fatigue, GI upset (so take with food if this occurs), and tremors. He may experience increased fatigue especially with taking both Keppra and Depakote dosage but this should improve once we are able to further decrease keppra dosage and eventually discontinue but it is imperative that we ensure he has a good amount of Depakote in the system prior to coming off the keppra to lower risk of seizure. If he has concerns of any other side effects, please ensure he calls [place].   Please fax prescriptions to [facility], thank you.

## 2022-03-28 NOTE — Telephone Encounter (Signed)
Keppra and Depakote Rx have been faxed to [facility], received confirmation. Letter with NP's specific instructions re: tapering off keppra and starting Depakote with  titration up placed in mail to patient. Will call patient today to advise him of same.

## 2022-03-28 NOTE — Telephone Encounter (Signed)
Spoke with patient and informed him of 2 new Rx sent to [place]. Advised he will receive a letter in a few days with NP's specific instructions that he must follow while beginning Depakote and tapering off keppra. Advised he call for any questions or concerns. He did not want to take down any information today, just will wait to get letter. Patient verbalized understanding, appreciation.

## 2022-04-19 DIAGNOSIS — I10 Essential (primary) hypertension: Secondary | ICD-10-CM | POA: Diagnosis not present

## 2022-04-19 DIAGNOSIS — Z8673 Personal history of transient ischemic attack (TIA), and cerebral infarction without residual deficits: Secondary | ICD-10-CM | POA: Diagnosis not present

## 2022-04-19 DIAGNOSIS — E785 Hyperlipidemia, unspecified: Secondary | ICD-10-CM | POA: Diagnosis not present

## 2022-04-19 DIAGNOSIS — E1159 Type 2 diabetes mellitus with other circulatory complications: Secondary | ICD-10-CM | POA: Diagnosis not present

## 2022-04-20 ENCOUNTER — Other Ambulatory Visit: Payer: Self-pay | Admitting: Neurology

## 2022-04-26 DIAGNOSIS — Z8673 Personal history of transient ischemic attack (TIA), and cerebral infarction without residual deficits: Secondary | ICD-10-CM | POA: Diagnosis not present

## 2022-04-26 DIAGNOSIS — E1159 Type 2 diabetes mellitus with other circulatory complications: Secondary | ICD-10-CM | POA: Diagnosis not present

## 2022-04-26 DIAGNOSIS — E785 Hyperlipidemia, unspecified: Secondary | ICD-10-CM | POA: Diagnosis not present

## 2022-04-26 DIAGNOSIS — I10 Essential (primary) hypertension: Secondary | ICD-10-CM | POA: Diagnosis not present

## 2022-05-03 ENCOUNTER — Ambulatory Visit: Payer: Self-pay

## 2022-05-03 NOTE — Patient Outreach (Signed)
  Care Coordination   Initial Visit Note   05/03/2022 Name: Richard Austin MRN: [id number] DOB: [birth year]  Richard Austin is a 76 y.o. year old male who sees Richard Austin, [place] for primary care. I spoke with  Richard Austin by phone today  What matters to the patients health and wellness today?  The patient stated that he is a veteran and he has all the resources he needs.    Goals Addressed             This Visit's Progress    COMPLETED: Care Coordination Activites - no follow up required                SDOH assessments and interventions completed:  No     Care Coordination Interventions Activated:  No  Care Coordination Interventions:  No, not indicated   Follow up plan: No further intervention required.   Encounter Outcome:  Pt. Visit Completed   [name] RN, BSN, [facility]   Phone: [phone]

## 2022-05-04 ENCOUNTER — Other Ambulatory Visit: Payer: Self-pay | Admitting: *Deleted

## 2022-05-04 NOTE — Patient Outreach (Signed)
[facility]) Care Management  05/04/2022  Richard Austin 1946/06/01 [id number]  Addendum for 02/17/22 [facility] Case closure - transfer to another care management program  Discussed warm transfer with patient to another care management program on 02/17/22  Care Plan : RN Care Manager Plan of Care  Updates made by Barbaraann Faster, RN since 05/04/2022 12:00 AM  Completed 05/04/2022   Problem: Complex Care Coordination Needs and disease management in patient with stroke, HTN, DM Resolved 02/17/2022  Priority: High  Onset Date: 09/26/2021     Long-Range Goal: Establish Plan of Care for Management Complex SDOH Barriers, disease management and Care Coordination Needs in patient with stroke, HTN, DM Completed 02/17/2022  Start Date: 09/26/2021  This Visit's Progress: On track  Recent Progress: On track  Priority: High  Note:   Current Barriers:  Knowledge Deficits related to plan of care for management of HTN, DMII, and stroke  Care Coordination needs related to Limited education about stroke, HTN, DM II HTN* EMMI stroke red alerts x 2 for appointments (09/26/21 & 10/10/21) resolved 10/10/21 09/21/21 to 09/23/21 admission,  11/15/21 admission for abdominal abscess with surgery on 11/16/21 -discharge 11/19/21 abdominal wound drain Unsuccessful outreach 12/13/21  RN CM Clinical Goal(s):  Patient will verbalize understanding of plan for management of HTN, DMII, and stroke as evidenced by voiced improved BP, HgA1c,decrease risk for re admission in next 30 business days  through collaboration with Consulting civil engineer, provider, and care team. - Goal met on 10/24/21 but revised with re admission on 11/15/21  Interventions: Further outreach for care coordination, disease management/education Inter-disciplinary care team collaboration (see longitudinal plan of care) Evaluation of current treatment plan related to  self management and patient's adherence to plan as established by provider 10/10/21  Assessed for concerns with scheduling appointments per new Friday 10/07/21 10 am EMMI stroke red alert referral received today 09/30/21 Denies other needs at this time 11/21/21 Outreach to [place] neurology, spoke with Richard Austin about Richard Austin outreach to RN CM and his need to have a follow up neuro appointment. Chart review. Arianna spoke with staff at the office, cancelled the appointment with neurology NP and sent note to Dr Richard Austin and/or RN to assist with possibly getting pt seen in 1-2 weeks.  Richard Vandevoort updated He agrees to follow up with RN CM prn  02/17/22 RN CM discussed warm transfer to upstream services and pending new RN CM outreach He voiced  understanding   Diabetes Interventions:  (Status:  Goal on track:  Yes. and Goal Met.) Short Term Goal 11/29/21 Remains stable Assessed patient's understanding of A1c goal: <7% Discussed plans with patient for ongoing care management follow up and provided patient with direct contact information for care management team Lab Results  Component Value Date   HGBA1C 6.4 (H) 09/22/2021   Hypertension Interventions:  (Status:   continues to work on home management ) Long Term Goal Last practice recorded BP readings:  BP Readings from Last 3 Encounters:  11/28/21 (!) 143/80  11/25/21 (!) 143/87  11/19/21 (!) 148/74  Most recent eGFR/CrCl: No results found for: EGFR  No components found for: CRCL  Evaluation of current treatment plan related to hypertension self management and patient's adherence to plan as established by provider Provided education to patient re: stroke prevention, s/s of heart attack and stroke Reviewed medications with patient and discussed importance of compliance Discussed plans with patient for ongoing care management follow up and provided patient with direct contact information for  care management team Advised patient, providing education and rationale, to monitor blood pressure daily and record, calling PCP for findings outside  established parameters Discussed complications of poorly controlled blood pressure such as heart disease, stroke, circulatory complications, vision complications, kidney impairment, sexual dysfunction Screening for signs and symptoms of depression related to chronic disease state  Assessed social determinant of health barriers 12/02/21 Discussed white coat syndrome - encouraged having BP check towards end of doctor visits HTN education 02/17/22 doing better   Stroke:  (Status:Goal Met.) Long Term Goal  Updates 02/17/22 found allergic to [place] after 3 injections, Rx for Praluent to be covered by [facility] pending or may discuss Nexlizet with providers Pt Questions Keppra possible side effect of fatigue He reports continued left hand dexterity and coordination Did not receive HH OT  12/02/21 improving no worsening symptoms Reviewed Importance of taking all medications as prescribed Reviewed Importance of attending all scheduled provider appointments Assessed social determinant of health barriers 11/01/21 assessed for neurology visit 12/02/21 Sent EPIC message to [facility] Neurology NP to inquire about follow up appoint per pt inquiry related to [place] 12/13/21 unsuccessful outreach -12/12/21 update from [facility] neurology to recommend June follow up appointment after start [place]. Review of chart indicate scheduled on 03/09/22 02/17/22 Discussed option of Nexlizet to discuss with providers prn  Discussed the importance of getting updated labs (CBC) Reviewed his last 3-4 HgA1c values Discussed use of anti-epileptic medicine for other neurological/neuropathy reason Discussed benefits of OT and possibility of future OT  Deactivated his my chart per his request as he states he does not prefer to use it  Patient Goals/Self-Care Activities: Take all medications as prescribed Attend all scheduled provider appointments Perform all self care activities independently  Perform IADL's (shopping, preparing meals,  housekeeping, managing finances) independently Call provider office for new concerns or questions  Attended Neurology (Dr Richard Austin) appointment on 11/28/21  Follow Up Plan:  The patient has been provided with contact information for the care management team and has been advised to call with any health related questions or concerns.  No further follow up required: followed by upstream and pending outreach from another RN [employer] L. Lavina Hamman, RN, BSN, [place] Coordinator Office number [phone] [facility] number [phone] Fax number [phone]

## 2022-05-18 DIAGNOSIS — M1990 Unspecified osteoarthritis, unspecified site: Secondary | ICD-10-CM | POA: Diagnosis not present

## 2022-05-19 ENCOUNTER — Ambulatory Visit: Payer: Medicare Other | Admitting: *Deleted

## 2022-06-26 ENCOUNTER — Ambulatory Visit: Payer: Medicare Other | Admitting: Internal Medicine

## 2022-06-26 ENCOUNTER — Encounter: Payer: Self-pay | Admitting: Internal Medicine

## 2022-06-26 ENCOUNTER — Other Ambulatory Visit: Payer: Self-pay

## 2022-06-26 VITALS — BP 115/72 | HR 57 | Temp 98.2°F | Ht 68.0 in | Wt 175.0 lb

## 2022-06-26 DIAGNOSIS — T83728D Exposure of other implanted mesh and other prosthetic materials to surrounding organ or tissue, subsequent encounter: Secondary | ICD-10-CM

## 2022-06-26 DIAGNOSIS — Z8614 Personal history of Methicillin resistant Staphylococcus aureus infection: Secondary | ICD-10-CM

## 2022-06-26 DIAGNOSIS — T8579XS Infection and inflammatory reaction due to other internal prosthetic devices, implants and grafts, sequela: Secondary | ICD-10-CM

## 2022-06-26 MED ORDER — DOXYCYCLINE HYCLATE 100 MG PO TABS: 100.0000 mg | ORAL_TABLET | Freq: Two times a day (BID) | ORAL | 6 refills | Status: DC

## 2022-06-26 NOTE — Progress Notes (Signed)
Patient Active Problem List   Diagnosis Date Noted   Infected prosthetic mesh of abdominal wall ([place]) 12/13/2021   Rectus sheath abscess 12/13/2021   Acquired trigger finger of left middle finger 11/24/2021   Seizure disorder ([place]) 11/16/2021   Abdominal pain 11/16/2021   Pulmonary nodule 11/16/2021   Normocytic anemia 11/16/2021   Hyperlipidemia, unspecified 11/01/2021   Myositis 11/01/2021   Palpitations 11/01/2021   HLD (hyperlipidemia) 11/01/2021   History of seizure 11/01/2021   Pain in joint of left shoulder 11/01/2021   Encounter for orthopedic follow-up care 10/06/2021   Seizure-like activity ([place]) 10/02/2021   History of cerebrovascular accident (CVA) with residual deficit 10/02/2021   Allergic rhinitis 09/26/2021   Bladder cancer ([place]) 09/26/2021   Chronic kidney disease, stage 3a ([place]) 09/26/2021   Dyslipidemia 09/26/2021   Erectile dysfunction 09/26/2021   GERD (gastroesophageal reflux disease) 09/26/2021   Osteoarthritis 09/26/2021   Type 2 diabetes mellitus without complications ([place]) 21/30/8657   Right wrist pain 09/24/2021   Pain of left hand 09/24/2021   Cerebral infarction, unspecified ([place]) 09/21/2021   Injury of left shoulder 09/19/2021   Abscess of groin, right 07/19/2021   Subcutaneous abscess 07/18/2021   Numbness of hand 07/13/2021   Spinal stenosis in cervical region 05/22/2021   Neck pain 03/07/2021   BPH (benign prostatic hyperplasia) 03/02/2020   Diverticulosis of large intestine 03/02/2020   SBO (small bowel obstruction) ([place]) 07/06/2018   AKI (acute kidney injury) ([place]) 07/06/2018   Hyperglycemia 07/06/2018   High blood pressure 07/06/2018   Leukocytosis 07/06/2018   Bladder mass 07/06/2018   Pericardial effusion 07/06/2018   Hypercalcemia 07/06/2018    Patient's Medications  New Prescriptions   No medications on file  Previous Medications   ALIROCUMAB (PRALUENT) 150 MG/ML SOAJ    Inject 150 mg into the skin every 14  (fourteen) days.   ASPIRIN EC 81 MG TABLET    Take 81 mg by mouth daily. Swallow whole.   CHLORTHALIDONE (HYGROTON) 25 MG TABLET    Take 25 mg by mouth daily.   DIVALPROEX (DEPAKOTE ER) 500 MG 24 HR TABLET    Take 1 tablet (500 mg total) by mouth at bedtime for 14 days, THEN 1 tablet (500 mg total) in the morning and at bedtime for 16 days.   DIVALPROEX (DEPAKOTE ER) 500 MG 24 HR TABLET    Take 1 tablet (500 mg total) by mouth 2 (two) times daily.   DOXYCYCLINE (VIBRA-TABS) 100 MG TABLET    Take 1 tablet (100 mg total) by mouth 2 (two) times daily.   EZETIMIBE (ZETIA) 10 MG TABLET    Take 1 tablet (10 mg total) by mouth daily.   FLUTICASONE (FLONASE) 50 MCG/ACT NASAL SPRAY    Place 2 sprays into both nostrils 2 (two) times daily as needed for allergies or rhinitis.    LEVETIRACETAM (KEPPRA) 250 MG TABLET    Take 1 tablet (250 mg total) by mouth 2 (two) times daily.   LISINOPRIL (PRINIVIL,ZESTRIL) 40 MG TABLET    Take 40 mg by mouth every morning.   MULTIPLE VITAMIN (MULTIVITAMIN) TABLET    Take 1 tablet by mouth daily.   OMEPRAZOLE (PRILOSEC) 40 MG CAPSULE    Take 40 mg by mouth daily.   OVER THE COUNTER MEDICATION    Take 1 capsule by mouth in the morning, at noon, and at bedtime. Super Beets   POLYVINYL ALCOHOL-POVIDONE PF (REFRESH) 1.4-0.6 % SOLN  Place 1 drop into both eyes daily as needed (dry eyes).   TADALAFIL (CIALIS) 20 MG TABLET    Take 20 mg by mouth once as needed (very high blood pressure with fluctuations).   TAMSULOSIN (FLOMAX) 0.4 MG CAPS CAPSULE    Take 0.4 mg by mouth daily.   TRAMADOL (ULTRAM) 50 MG TABLET    Take 1 tablet (50 mg total) by mouth every 6 (six) hours as needed.  Modified Medications   No medications on file  Discontinued Medications   No medications on file    Subjective: Richard Austin is a 76 y.o. M with PMHx as below presents for hospital follow-up. Admitted to [facility] 3/7-3/11 for rectus muscle abscess. He was previously admitted in November,  2022 for groin abscess and underwent I&D in OR with Cx+ MRSA and Strep intermedius discharged on cefdinir x5 days. Pt states he took about 10 days of antibiotics, so unclear if MRSA was treated.  3/7-3/11 admission showed CT showed R and LE rectus muscle abscess.IR consulted pt underwent US guided aspiration with Cx from both sites(Cx+ strep intermedius and MRSA).  Drain placed in right rectus muscle. Per IR left rectus collection is adjacent to mesh.  General surgery consulted and pt will require reconstructive surgery for mesh and foreign body removable, not advisable in the setting of recent CVA. Initially placed on vancomycin and ceftriaxone then transitioned to cefdinir and doxycyline to complete 4 weeks of treatment.  11/25/21:Reports he is tolerating antibiotics. Denies abdominal pain, fever, chills, N/V/D.  Interim: Seen by Dr. Harlow Asa on 4/18, no plans for surgical intervention 01/02/22: Denies any abdominal pain.completed antibiotic course with cefadroxil and doxycyline a couple weeks ago.  Tolerating doxycyline.   Today 06/26/22: No new complaints. Pt is adherent to doxycyline.   Review of Systems: Review of Systems  All other systems reviewed and are negative.   Past Medical History:  Diagnosis Date   Arthritis    Bladder tumor    x3   Diverticulitis 2007   with blockage   HLD (hyperlipidemia)    Hypertension    Paroxysmal SVT (supraventricular tachycardia) ([place]) 2001   had ablation   Pre-diabetes    Seizure ([place]) 10/02/2021   Stroke ([place]) 2023   difficulty swollowing, mouth droop anf numbness in hand Left side    Social History   Tobacco Use   Smoking status: Former    Packs/day: 1.50    Years: 30.00    Total pack years: 45.00    Types: Cigarettes    Quit date: 2015    Years since quitting: 8.7   Smokeless tobacco: Never   Tobacco comments:    QUIT 15 YRS AGO  Vaping Use   Vaping Use: Never used  Substance Use Topics   Alcohol use: Yes    Comment: SELDOM;  several shots of bourbon 07/17/2021   Drug use: Not Currently    Family History  Problem Relation Age of Onset   Peptic Ulcer Mother    Cirrhosis Mother    Alcohol abuse Mother    Bladder Cancer Father    Throat cancer Maternal Grandfather     Allergies  Allergen Reactions   Amlodipine Other (See Comments) and Swelling    Muscle tightness, fatigue    Nebivolol Hcl Swelling and Other (See Comments)    Muscle tightness and fatigue     Statins Other (See Comments)    Muscle tightness and fatigue  Other reaction(s): Myositis, Unknown    Latex Rash  Repatha [Evolocumab] Rash    Health Maintenance  Topic Date Due   FOOT EXAM  Never done   OPHTHALMOLOGY EXAM  Never done   Diabetic kidney evaluation - Urine ACR  Never done   Hepatitis C Screening  Never done   TETANUS/TDAP  Never done   Zoster Vaccines- Shingrix (1 of 2) Never done   COLONOSCOPY (Pts 45-69yr Insurance coverage will need to be confirmed)  Never done   Pneumonia Vaccine 76 Years old (1 - PCV) Never done   COVID-19 Vaccine (4 - Pfizer risk series) 09/22/2020   INFLUENZA VACCINE  04/11/2022   HEMOGLOBIN A1C  09/07/2022   Diabetic kidney evaluation - GFR measurement  03/09/2023   HPV VACCINES  Aged Out    Objective:  Vitals:   06/26/22 1121  Weight: 175 lb (79.4 kg)  Height: '5\' 8"'$  (1.727 m)   Body mass index is 26.61 kg/m.  Physical Exam Constitutional:      General: He is not in acute distress.    Appearance: He is normal weight. He is not toxic-appearing.  HENT:     Head: Normocephalic and atraumatic.     Right Ear: External ear normal.     Left Ear: External ear normal.     Nose: No congestion or rhinorrhea.     Mouth/Throat:     Mouth: Mucous membranes are moist.     Pharynx: Oropharynx is clear.  Eyes:     Extraocular Movements: Extraocular movements intact.     Conjunctiva/sclera: Conjunctivae normal.     Pupils: Pupils are equal, round, and reactive to light.  Cardiovascular:      Rate and Rhythm: Normal rate and regular rhythm.     Heart sounds: No murmur heard.    No friction rub. No gallop.  Pulmonary:     Effort: Pulmonary effort is normal.     Breath sounds: Normal breath sounds.  Abdominal:     General: Abdomen is flat. Bowel sounds are normal.     Palpations: Abdomen is soft.  Musculoskeletal:        General: No swelling. Normal range of motion.     Cervical back: Normal range of motion and neck supple.  Skin:    General: Skin is warm and dry.  Neurological:     General: No focal deficit present.     Mental Status: He is oriented to person, place, and time.  Psychiatric:        Mood and Affect: Mood normal.     Lab Results Lab Results  Component Value Date   WBC 9.0 03/08/2022   HGB 13.5 03/08/2022   HCT 41.6 03/08/2022   MCV 88.7 03/08/2022   PLT 264 03/08/2022    Lab Results  Component Value Date   CREATININE 1.21 03/08/2022   BUN 36 (H) 03/08/2022   NA 141 03/08/2022   K 4.4 03/08/2022   CL 109 03/08/2022   CO2 24 03/08/2022    Lab Results  Component Value Date   ALT 17 01/02/2022   AST 15 01/02/2022   ALKPHOS 98 11/15/2021   BILITOT 0.3 01/02/2022    Lab Results  Component Value Date   CHOL 209 (H) 09/22/2021   HDL 35 (L) 09/22/2021   LDLCALC 137 (H) 09/22/2021   TRIG 183 (H) 09/22/2021   CHOLHDL 6.0 09/22/2021   No results found for: "LABRPR", "RPRTITER" No results found for: "HIV1RNAQUANT", "HIV1RNAVL", "CD4TABS"   A/P #Left rectus muscle abscess adjacent to mesh SP UKoreaguided  aspiration #Right rectus muscle abscess SP drain placement on 3/8 -Cx+ MRSA and Strep intermedius -Completed cefadroxil(gram negative coverage given proximity of mesh) and doxycyline till 4/4(4 weeks) from drain placement.  -Starting 4/5 on  doxycyline alone for long term suppression as mesh is in place. Seen by Dr. Gwenith Spitz Surgery) and no plans for surgical intervention. -Perc drain removed 3/22 -Pt had TURP and intravesical gemcitabine  on 7/10 for another  bladder tumor->benign based on path. May need BCG pending furhter evaluation per pt.  Plan: -Continue Doxycyline '100mg'$  PO bid for chronic suppression -Labs today -Follow-up in one year      Laurice Record, MD [facility] 06/26/2022, 11:25 AM

## 2022-06-27 LAB — COMPREHENSIVE METABOLIC PANEL
AG Ratio: 1.7 (calc) (ref 1.0–2.5)
ALT: 20 U/L (ref 9–46)
AST: 16 U/L (ref 10–35)
Albumin: 4.2 g/dL (ref 3.6–5.1)
Alkaline phosphatase (APISO): 75 U/L (ref 35–144)
BUN/Creatinine Ratio: 25 (calc) — ABNORMAL HIGH (ref 6–22)
BUN: 28 mg/dL — ABNORMAL HIGH (ref 7–25)
CO2: 27 mmol/L (ref 20–32)
Calcium: 10 mg/dL (ref 8.6–10.3)
Chloride: 104 mmol/L (ref 98–110)
Creat: 1.14 mg/dL (ref 0.70–1.28)
Globulin: 2.5 g/dL (calc) (ref 1.9–3.7)
Glucose, Bld: 117 mg/dL — ABNORMAL HIGH (ref 65–99)
Potassium: 4.4 mmol/L (ref 3.5–5.3)
Sodium: 139 mmol/L (ref 135–146)
Total Bilirubin: 0.4 mg/dL (ref 0.2–1.2)
Total Protein: 6.7 g/dL (ref 6.1–8.1)

## 2022-06-27 LAB — CBC WITH DIFFERENTIAL/PLATELET
Absolute Monocytes: 562 cells/uL (ref 200–950)
Basophils Absolute: 29 cells/uL (ref 0–200)
Basophils Relative: 0.4 %
Eosinophils Absolute: 137 cells/uL (ref 15–500)
Eosinophils Relative: 1.9 %
HCT: 40 % (ref 38.5–50.0)
Hemoglobin: 13.4 g/dL (ref 13.2–17.1)
Lymphs Abs: 1289 cells/uL (ref 850–3900)
MCH: 30.2 pg (ref 27.0–33.0)
MCHC: 33.5 g/dL (ref 32.0–36.0)
MCV: 90.1 fL (ref 80.0–100.0)
MPV: 10 fL (ref 7.5–12.5)
Monocytes Relative: 7.8 %
Neutro Abs: 5184 cells/uL (ref 1500–7800)
Neutrophils Relative %: 72 %
Platelets: 242 10*3/uL (ref 140–400)
RBC: 4.44 10*6/uL (ref 4.20–5.80)
RDW: 13.2 % (ref 11.0–15.0)
Total Lymphocyte: 17.9 %
WBC: 7.2 10*3/uL (ref 3.8–10.8)

## 2022-06-27 LAB — C-REACTIVE PROTEIN: CRP: 0.4 mg/L (ref <8.0)

## 2022-06-27 LAB — SEDIMENTATION RATE: Sed Rate: 6 mm/h (ref 0–20)

## 2022-07-24 ENCOUNTER — Other Ambulatory Visit: Payer: Self-pay | Admitting: Ophthalmology

## 2022-09-08 DIAGNOSIS — K219 Gastro-esophageal reflux disease without esophagitis: Secondary | ICD-10-CM | POA: Diagnosis not present

## 2022-09-08 DIAGNOSIS — E785 Hyperlipidemia, unspecified: Secondary | ICD-10-CM | POA: Diagnosis not present

## 2022-09-08 DIAGNOSIS — I10 Essential (primary) hypertension: Secondary | ICD-10-CM | POA: Diagnosis not present

## 2022-09-08 DIAGNOSIS — E1159 Type 2 diabetes mellitus with other circulatory complications: Secondary | ICD-10-CM | POA: Diagnosis not present

## 2022-09-13 ENCOUNTER — Ambulatory Visit: Payer: No Typology Code available for payment source | Admitting: Adult Health

## 2022-09-15 DIAGNOSIS — M1990 Unspecified osteoarthritis, unspecified site: Secondary | ICD-10-CM | POA: Diagnosis not present

## 2022-09-15 DIAGNOSIS — J309 Allergic rhinitis, unspecified: Secondary | ICD-10-CM | POA: Diagnosis not present

## 2022-09-15 DIAGNOSIS — I10 Essential (primary) hypertension: Secondary | ICD-10-CM | POA: Diagnosis not present

## 2022-09-15 DIAGNOSIS — Z0000 Encounter for general adult medical examination without abnormal findings: Secondary | ICD-10-CM | POA: Diagnosis not present

## 2022-09-15 DIAGNOSIS — N400 Enlarged prostate without lower urinary tract symptoms: Secondary | ICD-10-CM | POA: Diagnosis not present

## 2022-10-12 DIAGNOSIS — M1990 Unspecified osteoarthritis, unspecified site: Secondary | ICD-10-CM | POA: Diagnosis not present

## 2022-11-07 IMAGING — CT CT HEAD W/O CM
3 series · 17 of 37 positions shown, 19 images · non-contrast
Comparison: MR brain 09/22/2021

CLINICAL DATA: Stroke follow-up.



[Series 3: head without · axial · non-contrast · 0.40mm/px · z∈[-126,-1]mm · 7 of 35 slices shown, 9 images]
[im 5/35  brain]
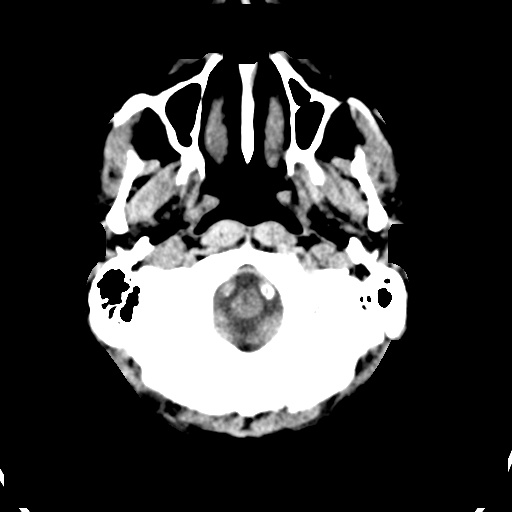
[im 5/35  bone]
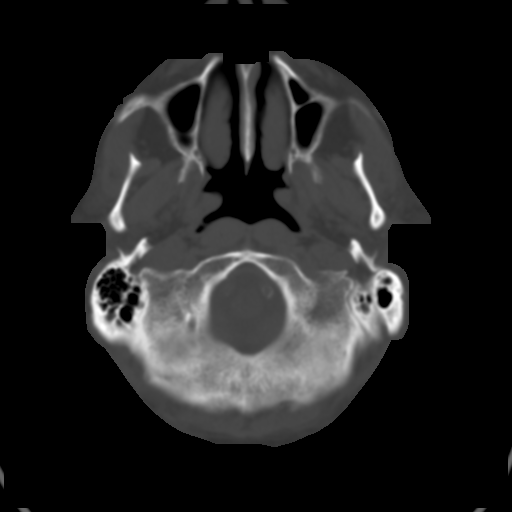
[im 9/35  brain]
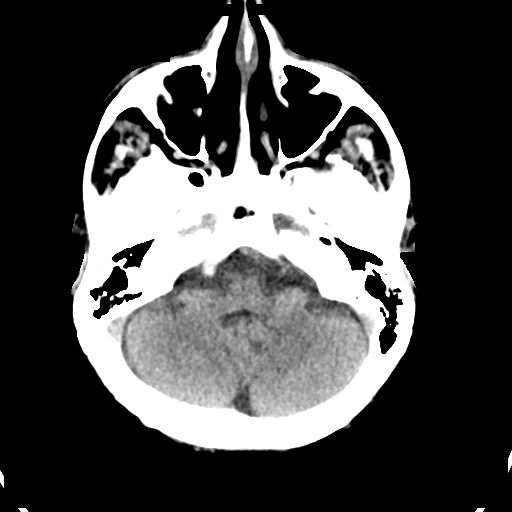
[im 13/35  brain]
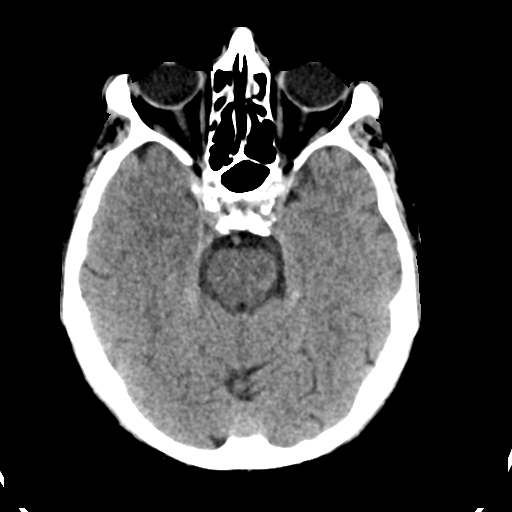
[im 18/35  brain]
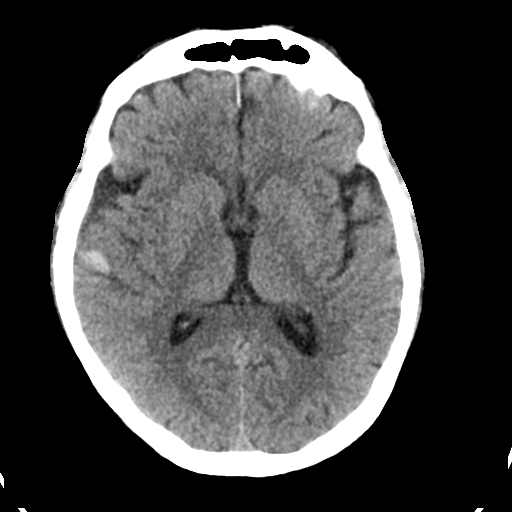
[im 22/35  brain]
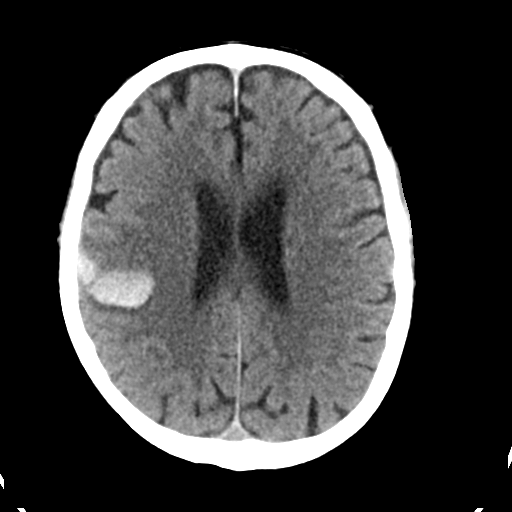
[im 22/35  bone]
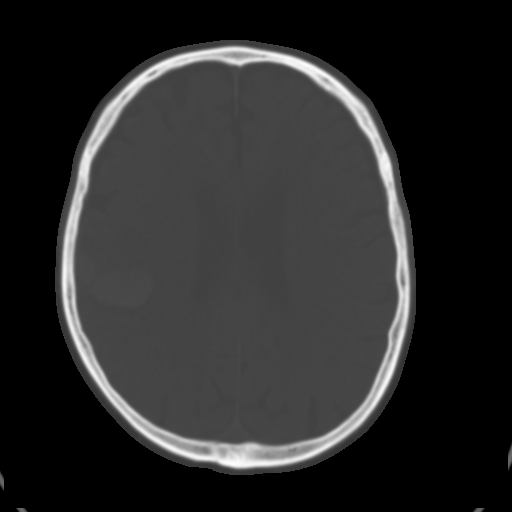
[im 26/35  brain]
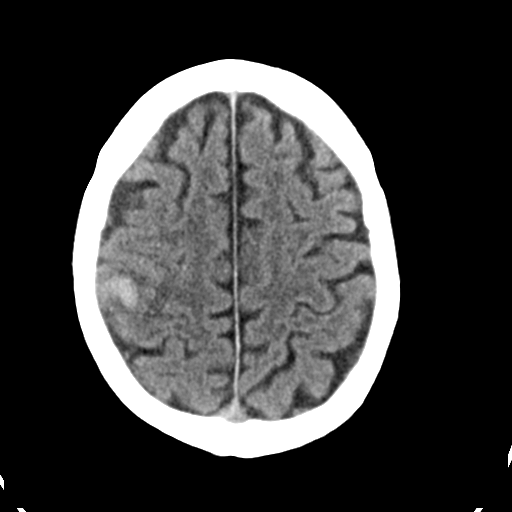
[im 30/35  brain]
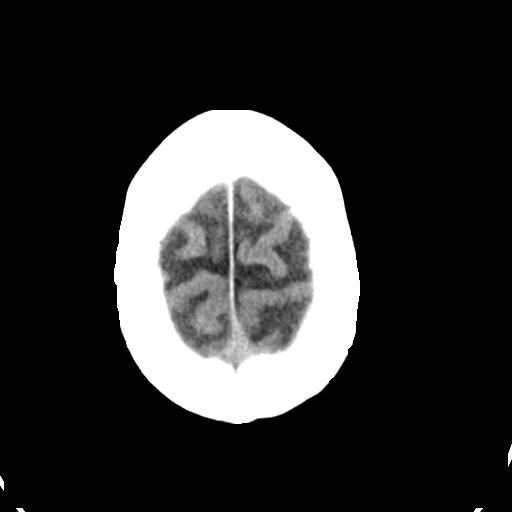

[Series 4: head bone · axial · 0.40mm/px · z∈[-130,-8]mm · 7 of 88 slices shown]
[im 9/88  bone]
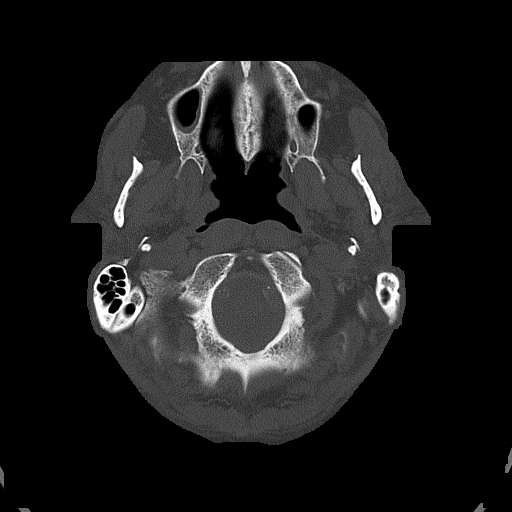
[im 18/88  bone]
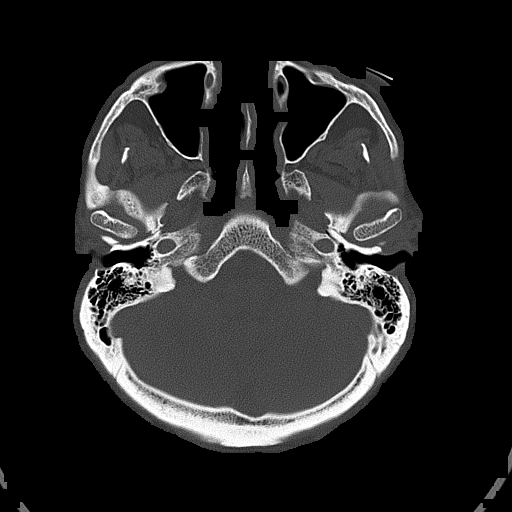
[im 27/88  bone]
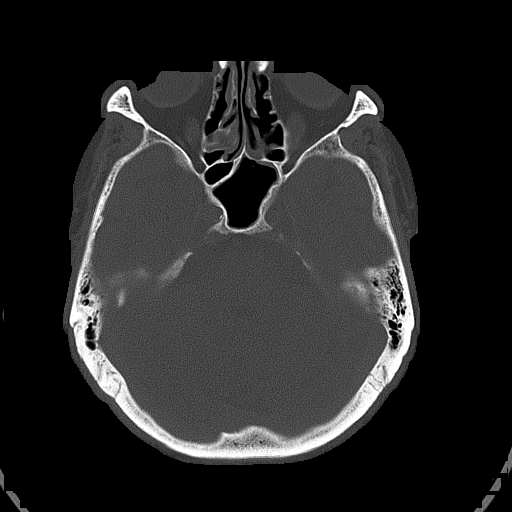
[im 40/88  bone]
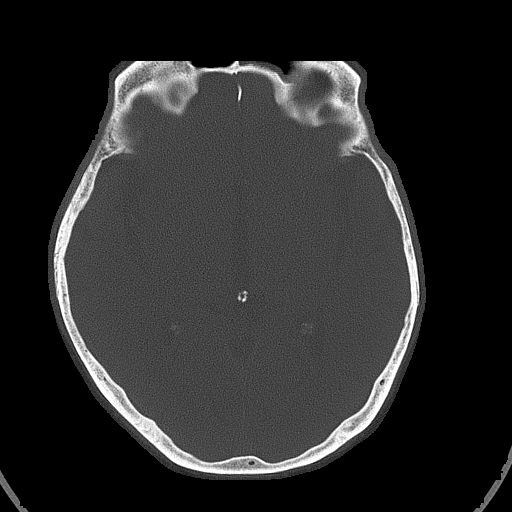
[im 48/88  bone]
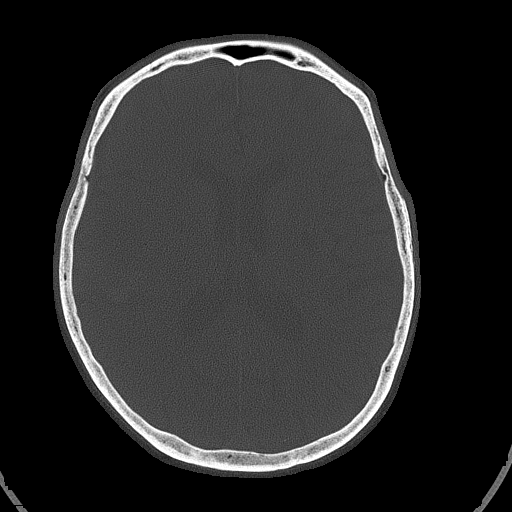
[im 61/88  bone]
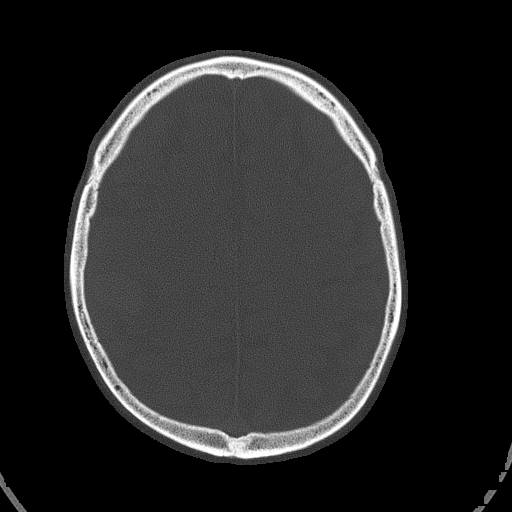
[im 70/88  bone]
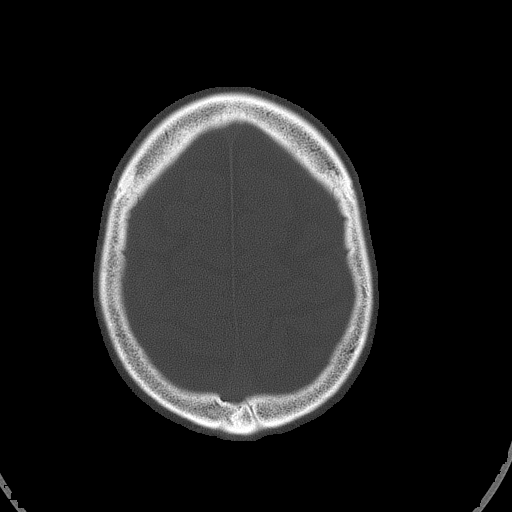

[Series 6: head without sag · sagittal · non-contrast · 0.34mm/px · 3 of 66 slices shown]
[im 22/66  brain]
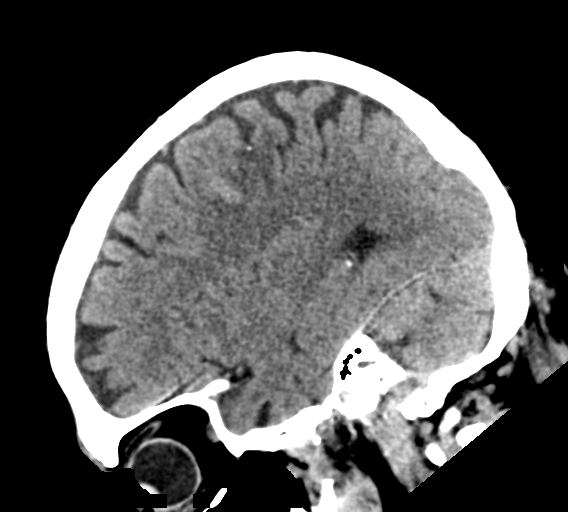
[im 33/66  brain]
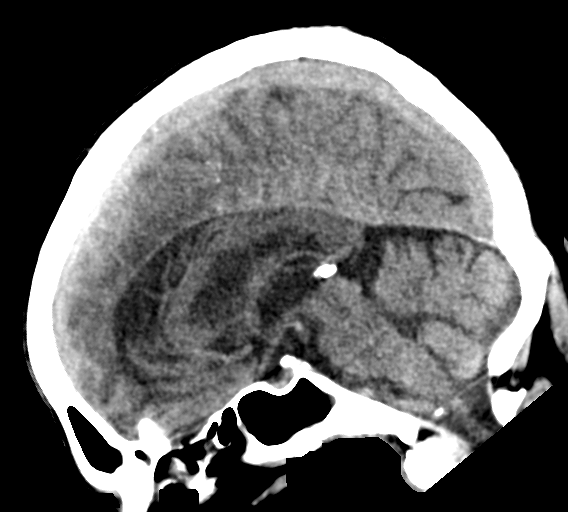
[im 44/66  brain]
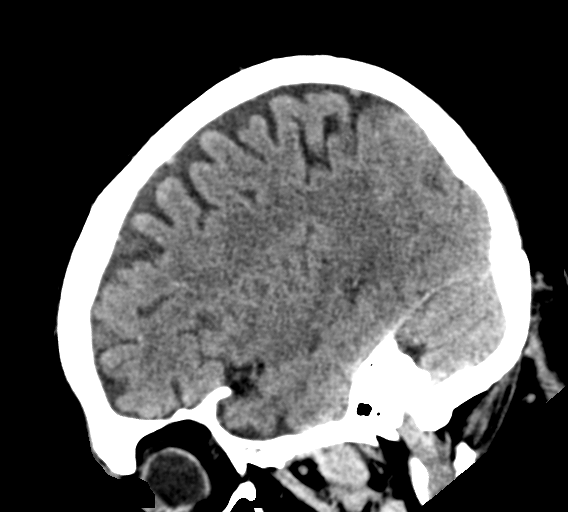

[17 of 37 positions shown; findings below may reference images not displayed]

FINDINGS: Brain: 3.2 x 1.4 x 2.2 cm intraparenchymal hematoma in the posterior
right frontal lobe with surrounding vasogenic edema. No evidence of
hydrocephalus, extra-axial collection or mass lesion/mass effect.
Periventricular white matter low attenuation as can be seen with
microvascular disease. Generalized cerebral atrophy.

Vascular: No hyperdense vessel or unexpected calcification.

Skull: No osseous abnormality.

Sinuses/Orbits: Mucosal thickening of the left frontal sinus and
bilateral ethmoid sinuses. Visualized mastoid sinuses are clear.
Visualized orbits demonstrate no focal abnormality.

Other: None
IMPRESSION: 1. A 3.2 x 1.4 x 2.2 cm intraparenchymal hematoma in the posterior
right frontal lobe with surrounding vasogenic edema.

## 2023-01-09 DIAGNOSIS — K080 Exfoliation of teeth due to systemic causes: Secondary | ICD-10-CM | POA: Diagnosis not present

## 2023-03-26 DIAGNOSIS — M4802 Spinal stenosis, cervical region: Secondary | ICD-10-CM | POA: Diagnosis not present

## 2023-03-26 DIAGNOSIS — I10 Essential (primary) hypertension: Secondary | ICD-10-CM | POA: Diagnosis not present

## 2023-03-26 DIAGNOSIS — E785 Hyperlipidemia, unspecified: Secondary | ICD-10-CM | POA: Diagnosis not present

## 2023-03-26 DIAGNOSIS — Z8673 Personal history of transient ischemic attack (TIA), and cerebral infarction without residual deficits: Secondary | ICD-10-CM | POA: Diagnosis not present

## 2023-03-26 DIAGNOSIS — E1151 Type 2 diabetes mellitus with diabetic peripheral angiopathy without gangrene: Secondary | ICD-10-CM | POA: Diagnosis not present

## 2023-03-26 DIAGNOSIS — Z79899 Other long term (current) drug therapy: Secondary | ICD-10-CM | POA: Diagnosis not present

## 2023-03-26 DIAGNOSIS — M5030 Other cervical disc degeneration, unspecified cervical region: Secondary | ICD-10-CM | POA: Diagnosis not present

## 2023-06-13 DIAGNOSIS — H2511 Age-related nuclear cataract, right eye: Secondary | ICD-10-CM | POA: Diagnosis not present

## 2023-06-19 ENCOUNTER — Ambulatory Visit: Payer: Self-pay | Admitting: Internal Medicine

## 2023-07-16 DIAGNOSIS — K080 Exfoliation of teeth due to systemic causes: Secondary | ICD-10-CM | POA: Diagnosis not present

## 2023-07-18 DIAGNOSIS — K080 Exfoliation of teeth due to systemic causes: Secondary | ICD-10-CM | POA: Diagnosis not present

## 2023-09-10 DIAGNOSIS — R946 Abnormal results of thyroid function studies: Secondary | ICD-10-CM | POA: Diagnosis not present

## 2023-09-10 DIAGNOSIS — Z79899 Other long term (current) drug therapy: Secondary | ICD-10-CM | POA: Diagnosis not present

## 2023-09-10 DIAGNOSIS — R7309 Other abnormal glucose: Secondary | ICD-10-CM | POA: Diagnosis not present

## 2023-09-10 DIAGNOSIS — Z13220 Encounter for screening for lipoid disorders: Secondary | ICD-10-CM | POA: Diagnosis not present

## 2023-09-17 DIAGNOSIS — Z0000 Encounter for general adult medical examination without abnormal findings: Secondary | ICD-10-CM | POA: Diagnosis not present

## 2023-09-17 DIAGNOSIS — N400 Enlarged prostate without lower urinary tract symptoms: Secondary | ICD-10-CM | POA: Diagnosis not present

## 2023-09-17 DIAGNOSIS — E785 Hyperlipidemia, unspecified: Secondary | ICD-10-CM | POA: Diagnosis not present

## 2023-09-17 DIAGNOSIS — I10 Essential (primary) hypertension: Secondary | ICD-10-CM | POA: Diagnosis not present

## 2023-09-17 DIAGNOSIS — M159 Polyosteoarthritis, unspecified: Secondary | ICD-10-CM | POA: Diagnosis not present

## 2024-01-22 DIAGNOSIS — K080 Exfoliation of teeth due to systemic causes: Secondary | ICD-10-CM | POA: Diagnosis not present

## 2024-03-10 DIAGNOSIS — D649 Anemia, unspecified: Secondary | ICD-10-CM | POA: Diagnosis not present

## 2024-03-10 DIAGNOSIS — Z79899 Other long term (current) drug therapy: Secondary | ICD-10-CM | POA: Diagnosis not present

## 2024-03-10 DIAGNOSIS — E7800 Pure hypercholesterolemia, unspecified: Secondary | ICD-10-CM | POA: Diagnosis not present

## 2024-03-10 DIAGNOSIS — E1159 Type 2 diabetes mellitus with other circulatory complications: Secondary | ICD-10-CM | POA: Diagnosis not present

## 2024-03-17 DIAGNOSIS — Z8551 Personal history of malignant neoplasm of bladder: Secondary | ICD-10-CM | POA: Diagnosis not present

## 2024-03-17 DIAGNOSIS — E1159 Type 2 diabetes mellitus with other circulatory complications: Secondary | ICD-10-CM | POA: Diagnosis not present

## 2024-03-17 DIAGNOSIS — Z8673 Personal history of transient ischemic attack (TIA), and cerebral infarction without residual deficits: Secondary | ICD-10-CM | POA: Diagnosis not present

## 2024-03-17 DIAGNOSIS — I10 Essential (primary) hypertension: Secondary | ICD-10-CM | POA: Diagnosis not present

## 2024-06-03 ENCOUNTER — Encounter: Payer: Self-pay | Admitting: Neurology

## 2024-06-03 ENCOUNTER — Other Ambulatory Visit: Payer: Self-pay

## 2024-06-03 DIAGNOSIS — R202 Paresthesia of skin: Secondary | ICD-10-CM

## 2024-06-14 ENCOUNTER — Encounter: Payer: Self-pay | Admitting: Neurology

## 2024-07-28 ENCOUNTER — Ambulatory Visit (INDEPENDENT_AMBULATORY_CARE_PROVIDER_SITE_OTHER): Admitting: Neurology

## 2024-07-28 DIAGNOSIS — R202 Paresthesia of skin: Secondary | ICD-10-CM | POA: Diagnosis not present

## 2024-07-28 DIAGNOSIS — G5603 Carpal tunnel syndrome, bilateral upper limbs: Secondary | ICD-10-CM

## 2024-07-28 NOTE — Procedures (Signed)
 [facility] Neurology  [address] [place], [address]  [place], [place] [address] Tel: [phone] Fax: [phone] Test Date:  07/28/2024  Patient: Richard Austin DOB: 19-Oct-1945 Physician: Venetia Potters, MD  Sex: Male Height: 5' 8 Ref Phys: Deward Moles, MD  ID#: [id number]   Technician:    History: This is a 78 year old male with left upper limb weakness and numbness.  NCV & EMG Findings: Extensive electrodiagnostic evaluation of the left upper limb with additional nerve conduction studies of the right upper limb shows: Right median sensory response is absent. Left median sensory response shows prolonged distal peak latency (4.6 ms) and reduced amplitude (7 V). Left ulnar sensory response is within normal limits. Right median (APB) motor response shows borderline distal onset latency (4.0 ms) and reduced amplitude (4.5 mV). Left median (APB) motor response shows borderline distal onset latency (4.0 ms) and amplitude (5.0 mV). Left ulnar (ADM) motor response is within normal limits. Chronic motor axon loss changes without accompanying active denervation changes are seen in the left abductor pollicis brevis muscle. All other tested muscles are within normal limits.  Neuromuscular Ultrasound Findings: High frequency (4.0-16.0 MHz) B-mode, nonvascular ultrasound of the bilateral upper limbs shows: Cross sectional areas (CSA) of bilateral median (wrist to forearm) nerves are within normal limits. Wrist to forearm ratio of the left median nerve is increased (1.70). Wrist to forearm ratio of the right median nerve is within normal limits. No other obvious lesion involving the adjacent bone or tendon is identified. No definite vascular abnormalities.  Impression: This is an abnormal study. The findings are most consistent with the following: Evidence of bilateral median mononeuropathy at or distal to the wrist, consistent with carpal tunnel syndrome. The findings are severe in degree  electrically on the right and moderate in degree electrically on the left. No electrodiagnostic evidence of a left cervical (C5-T1) motor radiculopathy.    ___________________________ Venetia Potters, MD    NCS+ Motor Nerve Results    Latency Amplitude F-Lat Segment Distance CV Comment  Site (ms) Norm (mV) Norm (ms)  (cm) (m/s) Norm   Left Median (APB) Motor  Wrist 4.0  < 4.0 5.0  > 5.0        Elbow 9.4 - 4.3 -  Elbow-Wrist 30.5 56  > 50   Right Median (APB) Motor  Wrist 4.0  < 4.0 *4.5  > 5.0        Left Ulnar (ADM) Motor  Wrist 1.98  < 3.1 9.5  > 7.0        Bel elbow 6.0 - 8.9 -  Bel elbow-Wrist 20.5 51  > 50   Ab elbow 8.0 - 7.8 -  Ab elbow-Bel elbow 10 50 -    Sensory Sites    Neg Peak Lat Amplitude (O-P) Segment Distance Velocity Comment  Site (ms) Norm (V) Norm  (cm) (ms)   Left Median Sensory  Wrist-Dig II *4.6  < 3.8 *7  > 10 Wrist-Dig II 13    Right Median Sensory  Wrist-Dig II *NR  < 3.8 *NR  > 10 Wrist-Dig II 13    Left Ulnar Sensory  Wrist-Dig V 3.0  < 3.2 7  > 5 Wrist-Dig V 11     EMG+   Side Muscle Ins.Act Fibs Fasc Recrt Amp Dur Poly Activation Comment  Left FDI Nml Nml Nml Nml Nml Nml Nml Nml N/A  Left EIP Nml Nml Nml Nml Nml Nml Nml Nml N/A  Left Pronator teres Nml Nml  Nml Nml Nml Nml Nml Nml N/A  Left APB Nml Nml Nml *1- *1+ *1+ Nml Nml N/A  Left Biceps Nml Nml Nml Nml Nml Nml Nml Nml N/A  Left Triceps Nml Nml Nml Nml Nml Nml Nml Nml N/A  Left Deltoid Nml Nml Nml Nml Nml Nml Nml Nml N/A   Nerve Measurements   Site Area Segment Area Ratio Mobility Vascularity Comment   mm Norm   Norm     Left Median  Wrist 11.4  < 13.0         Forearm 6.7  < 10.7  Wrist - Forearm *1.70  < 1.50      Right Median  Wrist 8.8  < 13.0         Forearm 6.6  < 10.7  Wrist - Forearm 1.33  < 1.50          Waveforms:  Motor        Sensory

## 2024-08-14 NOTE — Care Plan (Signed)
  Problem: Coping: Goal: Ability to verbalize feelings will improve by discharge Outcome: Progressing   Problem: Fluid Volume: Goal: Ability to maintain a balanced intake and output will improve by discharge Description: Ability to maintain a balanced intake and output will improve by discharge Outcome: Progressing

## 2024-08-15 ENCOUNTER — Telehealth (HOSPITAL_COMMUNITY): Payer: Self-pay

## 2024-08-15 NOTE — Telephone Encounter (Signed)
 Outside/paper referral received by Dr. Lanie office from [facility]. Referral would need MD order and EKG to be sent.  Went over cardiac rehab with patient, he stated he will call [facility] and have them send over a referral. He does not want us  to contact him about this until we receive the referral from the [place].  Closing this referral.

## 2024-08-18 ENCOUNTER — Telehealth: Payer: Self-pay | Admitting: *Deleted

## 2024-08-18 NOTE — Transitions of Care (Post Inpatient/ED Visit) (Signed)
   08/18/2024  Name: [name] MRN: [id number] DOB: May 05, 1946  Today's TOC FU Call Status: Today's TOC FU Call Status:: Unsuccessful Call (1st Attempt) Unsuccessful Call (1st Attempt) Date: 08/18/24  Attempted to reach the patient regarding the most recent Inpatient/ED visit.  Follow Up Plan: Additional outreach attempts will be made to reach the patient to complete the Transitions of Care (Post Inpatient/ED visit) call.   Cathlean Headland BSN RN [facility] Health Care Management Coordinator [email or web address] Direct Dial: [phone]  Fax: [phone] Website: [email or web address]

## 2024-08-19 ENCOUNTER — Telehealth: Payer: Self-pay | Admitting: *Deleted

## 2024-08-19 NOTE — Transitions of Care (Post Inpatient/ED Visit) (Signed)
   08/19/2024  Name: Richard Austin MRN: [id number] DOB: 24-Feb-1946  Today's TOC FU Call Status: Today's TOC FU Call Status:: Successful TOC FU Call Completed TOC FU Call Complete Date: 08/19/24  Patient's Name and Date of Birth confirmed. Name, DOB  Transition Care Management Follow-up Telephone Call Discharge Facility: Other ([facility]) Name of Other ([facility]) [facility]charge Facility: [facility] Type of Discharge: Inpatient Admission Primary Inpatient Discharge Diagnosis:: CAD/CABG How have you been since you were released from the hospital?: Better Any questions or concerns?: No  Items Reviewed: Did you receive and understand the discharge instructions provided?: Yes Medications obtained,verified, and reconciled?: Yes (Medications Reviewed) Any new allergies since your discharge?: No Dietary orders reviewed?: No Do you have support at home?: Yes People in Home [RPT]: spouse Name of Support/Comfort Primary Source: [name]  Medications Reviewed Today: Medications Reviewed Today   Medications were not reviewed in this encounter     Home Care and Equipment/Supplies: Were Home Health Services Ordered?: NA Any new equipment or medical supplies ordered?: NA  Functional Questionnaire: Do you need assistance with bathing/showering or dressing?: No Do you need assistance with meal preparation?: Yes Do you need assistance with eating?: No Do you have difficulty maintaining continence: No Do you need assistance with getting out of bed/getting out of a chair/moving?: No Do you have difficulty managing or taking your medications?: No  Follow up appointments reviewed: PCP Follow-up appointment confirmed?: Yes Date of PCP follow-up appointment?: 08/27/24 [facility]up [id number] [id number] Follow-up appointment confirmed?: Yes Date of Specialist follow-up appointment?: 09/18/24 Follow-Up Specialty Provider::  Cardothoracic surgeon Do you need transportation to your follow-up appointment?: No Do you understand care options if your condition(s) worsen?: Yes-patient verbalized understanding  Discussed and offered 30 day TOC program.  Patient declined .  The patient has been provided with contact information for the care management team and has been advised to call with any health -related questions or concerns.  The patient verbalized understanding with current plan of care.  The patient is directed to their insurance card regarding availability of benefits coverage   Cathlean Headland BSN RN [facility] Health Care Management Coordinator [email or web address] Direct Dial: [phone]  Fax: [phone] Website: [email or web address]

## 2024-08-25 NOTE — Progress Notes (Signed)
 Perfect Care Visit  Procedure: CABG X1 Surgeon/ Location:Edward Kincaid, MD/ [facility] date: 08/16/24  Biometric Reading Averages:  BP: 174/92  Heart Rate: 61-119/ avg 80  Weight: 179.6  Steps: avg 1698   1. How are you feeling today? GOOD 2. Do you have a fever? No 3. How is your pain on a scale of 1-10? 0/10 tylenol  4. Are you feeling short of breath at rest? No 5. Are you feeling short of breath with activity? Yes, minimal 6. Do you have a cough?No  7. What level are you able to achieve with your Incentive Spirometer breathing exercises ? 2000-3000 8. Do you have chest pain not associated with your incision? No 9. Are you experiencing irregular heart rhythm? No 10. Are you experiencing fast or racing heart rate? No 11. How is your appetite  ? GOOD 12. Have you had a bowel movement in the last 48 hours?Yes Normal Color/Formed? Yes 13. Are you feeling dizzy with any changes in position? No 14. Do you have any swelling in your legs, ankles or feet? Yes 15. Any redness or drainage around your incision? No Approximated? Yes Washing with soap and water Yes  Insert Image: Currently there no images available. Per the patient and family, all incisions are clean, dry and intact. No redness, swelling or drainage.  16. Do you have any questions about your current medications? No  ASA & AC  Yes/ aspirin  and plavix   BB/HTN meds  Yes  Statin  Yes 17. Has Home Health nurse been out to visit? No  Continue sternal precautions, increase ambulation as tolerated, practice pulmonary hygiene continue daily VS and weight. Call office for weight gain >=3lb/day or >=5lbs/wk or if experiences any SOB, dizziness, lightheadedness, palpitations. May use Carium for non urgent topics, call Surgeon's office for all urgent matters.The patient verbalized understanding to instructions above.     Concerns addressed during visit:  Hypertension: Patient has been having hypertension on his blood pressure readings.  Average blood pressures aree 149/83. Today his morning blood pressure is 174/92, two hour after taking his medications. Friday I triaged him for hypertension and we restarted his Losartan at 50mg  daily. I have reached out to the team today and we are going to increase his losartan to 100mg  daily (his pre surgery dose). Explained this to the patient and he repeated with understanding.    Follow up appointments: [] Cardiology: [facility] 09/22/24 11a [] PCP: [facility] 08/27/24 11:30a  [] Cardiac rehab: Referral sent to [facility] [phone]       Vitals:    Follow up items for next visit: [] Blood pressure [] Cardiac rehab

## 2024-08-26 ENCOUNTER — Telehealth (HOSPITAL_COMMUNITY): Payer: Self-pay

## 2024-08-26 NOTE — Telephone Encounter (Signed)
 Informed patient we did receive an authorization from the [place] for cardiac rehab and that he will need to attend his f/u appts on 1/08 ([facility]) and [facility]) to receive cardiologist clearance before we can schedule cardiac rehab.  Patient confirmed understanding, he stated if the notes from his appt on 1/08 were not enough to then call him and let him know so he can request the [facility] to immediately send us  the notes from his visit on 1/12 to expedite the scheduling process.

## 2024-08-26 NOTE — Telephone Encounter (Signed)
 Received referral from [facility].  Called patient to see if patient was interested in Cardiac rehab.  Patient said that he was interested.  I asked patient would he be using [facility] community plan of [employer] Medicare.  Patient stated that he would be using [facility].  Advised that we would need authorization from [place] to schedule Cardiac rehab.  Patient started to get loud and irate and said that [facility] had the authorization from [place].   Advised patient that [facility] only sent referral and there was no [facility] authorization attached.  Client started to yell about the authorization.  He was not on speaker but he was so loud that Coworker intervened and put call on the speaker. She advised that we would need the [facility] Authorization and he yelled and at her and told her to get the Authorization herself and he hung up on her.

## 2024-09-15 NOTE — Progress Notes (Signed)
 Cardiothoracic Surgery Clinic Note Mon 09/18/2024  Patient:  Richard Austin [address] [place], [place]  [address]  MRN: [id number] DOB: [birth year] Age: 79 y.o.  Encounter Date:  09/15/2024  Reason for Visit: Post Operative Follow Up  Subjective:  HPI: Mr. Devion Chriscoe is a 79 year old male, with PMH of CAD, HTN, HLD, PSVT s/p ablation, Ascending Aorta Aneurysm (4.4 cm), Stroke and seizures, diverticulitis. He presents for post-operative clinic visit s/p CABG x 1 (LIMA to LAD) via left thoracotomy by Dr. Burnie 08/12/2024. He was extubated on 08/12/24. He was transferred to 5RT on POD#2.  Patient's post-operative course was uncomplicated and he was discharged to home 08/16/2024. His wife Raeann accompanies him today.    Since discharge, patient is making improving daily. He is walking 10 minutes a day and tolerates it well. He denies any palpitations, chest pain, LE edema, SOB or DOE. He plans to participate in Cardiac Rehab.  He denies any light-headedness, syncopal episodes, fever, chills or night sweats.  He is taking metoprolol 37.5 mg BID. He states his BP runs 140/65 mmHG with HR in 60 bpm. He states he walks up flight of stairs to measure his BP every morning. He states he is tolerating his meds well. He states he is having normal BM. He is eating well. He denies any issues with his incisions.   His PCP is Dr. Lonell Collet; he also sees Hunter Reef at the [facility] in [place]. He saw Dr. Collet yesterday and had a good check-up.  His cardiologist is Dr. Curtistine SQUIBB. (?) at the [facility] in [place]. He states it is not Dr. Lucienne.He is scheduled to see his cardiologist on Monday. He was referred to [facility] for [facility]. He plans to participate.  Medical History[1]  Surgical History[2]  Family History[3]  Social History   Socioeconomic History   Marital status: Unknown    Spouse name: Not on file   Number of children: Not on file   Years of education: Not on  file   Highest education level: Not on file  Occupational History   Not on file  Tobacco Use   Smoking status: Former    Types: Cigarettes   Smokeless tobacco: Never  Substance and Sexual Activity   Alcohol use: Yes    Alcohol/week: 0.0 - 1.0 standard drinks of alcohol   Drug use: Not Currently   Sexual activity: Not on file  Other Topics Concern   Not on file  Social History Narrative   Not on file   Social Drivers of Health   Living Situation: Low Risk (07/24/2024)   Living Situation    What is your living situation today?: I have a steady place to live    Think about the place you live. Do you have problems with any of the following? Choose all that apply:: None/None on this list  Food Insecurity: Low Risk (07/24/2024)   Food vital sign    Within the past 12 months, you worried that your food would run out before you got money to buy more: Never true    Within the past 12 months, the food you bought just didn't last and you didn't have money to get more: Never true  Transportation Needs: No Transportation Needs (07/24/2024)   Transportation    In the past 12 months, has lack of reliable transportation kept you from medical appointments, meetings, work or from getting things needed for daily living? : No  Utilities: Low Risk (07/24/2024)  Utilities    In the past 12 months has the electric, gas, oil, or water company threatened to shut off services in your home? : No  Safety: Low Risk (07/24/2024)   Safety    How often does anyone, including family and friends, physically hurt you?: Never    How often does anyone, including family and friends, insult or talk down to you?: Never    How often does anyone, including family and friends, threaten you with harm?: Never    How often does anyone, including family and friends, scream or curse at you?: Never  Alcohol Screening: Not At Risk (07/24/2024)   AUDIT-C    Frequency of Alcohol Consumption: Monthly or  less    Average Number of Drinks: 1 or 2    Frequency of Binge Drinking: Never  Tobacco Use: Medium Risk (08/12/2024)   Patient History    Smoking Tobacco Use: Former    Smokeless Tobacco Use: Never    Passive Exposure: Not on file  Depression: Not At Risk (07/24/2024)   PHQ-2    PHQ-2 Score: 0  Social Connections: Socially Integrated (11/29/2021)   Received from [facility]   Social Connection and Isolation Panel    In a typical week, how many times do you talk on the phone with family, friends, or neighbors?: Twice a week    How often do you get together with friends or relatives?: Twice a week    How often do you attend church or religious services?: 1 to 4 times per year    Do you belong to any clubs or organizations such as church groups, unions, fraternal or athletic groups, or school groups?: Yes    How often do you attend meetings of the clubs or organizations you belong to?: 1 to 4 times per year    Are you married, widowed, divorced, separated, never married, or living with a partner?: Married  Physicist, Medical Strain: Low Risk (11/29/2021)   Received from [facility]   Overall Financial Resource Strain (CARDIA)    Difficulty of Paying Living Expenses: Not hard at all    Allergies[4]  Medications Prior to Visit[5]    Objective:  Physical Examination:    General appearance - alert, well appearing, and in no distress and oriented to person, place, and time Mental status - alert, oriented to person, place, and time, normal mood, behavior, speech, dress, motor activity, and thought processes Chest - clear to auscultation, no wheezes, rales or rhonchi, symmetric air entry Heart - normal rate, regular rhythm, normal S1, S2, no murmurs, rubs, clicks or gallops Musculoskeletal - no joint tenderness, deformity or swelling Extremities - peripheral pulses normal, no pedal edema, no clubbing or cyanosis Skin - normal coloration and turgor, no rashes, no suspicious  skin lesions noted. Left thoracotomy incision and chest tube site are healed. No sign of infection.   BP 130/65   Pulse 64   Temp 97.4 F (36.3 C) (Temporal)   Resp 18   Ht 1.74 m (5' 8.5)   Wt 84.1 kg (185 lb 8 oz)   SpO2 98%   BMI 27.79 kg/m   BP 143/65 (BP Location: Right arm, Patient Position: Sitting)   Pulse 65   Temp 97.4 F (36.3 C) (Temporal)   Resp 18   Ht 1.74 m (5' 8.5)   Wt 84.1 kg (185 lb 8 oz)   SpO2 98%   BMI 27.79 kg/m    Assessment: s/p CABG x 1 (LIMA to LAD) via  left thoracotomy by Dr. Burnie 08/12/2024. He is doing well from surgical standpoint.  Plan:  -May drive after today's visit if not on pain medications. -Continue current medications. -Keep follow-up appointments with cardiologist and primary doctor -Encourage cardiac rehab participation. -Increase physical activity to at least 30 minutes, 5 times per week. -Continue with heart healthy diet. -At this time, we will not schedule follow-up with our office as recovery is as expected. -Please call or return to clinic if you have any questions or concerns. [phone]        [1] No past medical history on file. [2] Past Surgical History: Procedure Laterality Date   CORONARY ARTERY BYPASS GRAFT Left 08/12/2024   BYPASS GRAFT CORONARY ARTERY  OFF PUMP X 1 VIA LEFT THORACOTOMY performed by [place] VEAR Burnie, MD at [facility] OR  [3] No family history on file. [4] Allergies Allergen Reactions   Amlodipine Other (See Comments) and Swelling    weakness  Muscle tightness, fatigue   weakness  Muscle tightness, fatigue   Other reaction(s): Unknown  Other reaction(s): Unknown  Other reaction(s): Unknown   Nebivolol Other (See Comments) and Swelling    weakness  Muscle tightness and fatigue   weakness  Muscle tightness and fatigue   Other reaction(s): Unknown  Other reaction(s): Unknown  Other reaction(s): Unknown   Statins-Hmg-Coa Reductase Inhibitors Other (See Comments)     weakness  Muscle tightness and fatigue   Other reaction(s): Myositis, Unknown   Atorvastatin Other (See Comments)   Evolocumab  Rash   Latex Rash  [5] Outpatient Medications Prior to Visit  Medication Sig Dispense Refill   alirocumab  (PRALUENT ) 150 mg/mL pnij injection Inject 150 mg under the skin every 14 (fourteen) days.     aspirin  81 mg EC tablet Take 81 mg by mouth daily.     azelastine (ASTEPRO) 205.5 mcg (0.15 %) spry nasal spray Administer 2 sprays into each nostril 2 (two) times a day.     Cinnamon 500 mg cap Take 1 capsule by mouth daily.     clopidogreL  (PLAVIX ) 75 mg tablet Take 1 tablet (75 mg total) by mouth daily. 30 tablet 2   ezetimibe  (ZETIA ) 10 mg tablet Take 10 mg by mouth daily. for cholesterol     fluticasone  propionate (FLONASE ) 50 mcg/spray nasal spray Administer 1 spray into each nostril daily.     furosemide (LASIX) 20 mg tablet Take one tablet daily as needed for lower extremity swelling or weight gain >3 pounds in 1 day or >5 pounds in 1 week Call provider if having to take Lasix 30 tablet 0   losartan (COZAAR) 100 mg tablet Take 1 tablet (100 mg total) by mouth daily. for high blood pressure     magnesium oxide 400 mg (241 mg magnesium) tab Take 1 tablet (400 mg total) by mouth daily. 90 tablet 0   MEN'S MULTI-VITAMIN ORAL Take 1 tablet by mouth daily.     metoprolol tartrate (LOPRESSOR) 25 mg tablet Take 1 tablet (25 mg total) by mouth 2 (two) times a day.     pantoprazole  (PROTONIX ) 40 mg EC tablet Take 1 tablet (40 mg total) by mouth every morning before breakfast. 30 tablet 2   No facility-administered medications prior to visit.

## 2024-09-22 ENCOUNTER — Telehealth (HOSPITAL_COMMUNITY): Payer: Self-pay

## 2024-09-22 NOTE — Telephone Encounter (Signed)
 Called patient to see if he was interested in participating in the Cardiac Rehab Program. Patient will come in for orientation on 1/29 and will attend the 8:15 exercise class.  Mailed package and sent MyChart message.

## 2024-10-01 ENCOUNTER — Telehealth (HOSPITAL_COMMUNITY): Payer: Self-pay

## 2024-10-01 NOTE — Telephone Encounter (Signed)
 Patient called stating he received his orientation letter in the mail and claims he did not sign up for the 8:15 CR class, he would rather be in the 10:15 class. Patient has been moved to the 10:15 CR class.

## 2024-10-03 ENCOUNTER — Other Ambulatory Visit: Payer: Self-pay | Admitting: Urology

## 2024-10-03 MED ORDER — GEMCITABINE CHEMO FOR BLADDER INSTILLATION 2000 MG: 2000.0000 mg | Freq: Once | INTRAVENOUS | Status: AC

## 2024-10-08 ENCOUNTER — Telehealth (HOSPITAL_COMMUNITY): Payer: Self-pay

## 2024-10-08 NOTE — Telephone Encounter (Signed)
 Called patient and confirmed appt for tomorrow @ 8:00 am. Completed H/H.   Alm Parkins MS, ACSM-CEP, CCRP

## 2024-10-08 NOTE — Progress Notes (Addendum)
 PCP - Lonell Collet , MD Cardiologist - Dr. Curtistine SQUIBB. [facility] surgery -  Dr. Kincaide Follow up 09-18-24 epic  Barnie Moats PA-C  PPM/ICD -  Device Orders -  Rep Notified -   Chest x-ray - 2023  epic EKG -  Stress Test -  ECHO - 2023 epic Cardiac Cath -   Sleep Study - yes within the last couple mths  CPAP - no CPAP yet   Fasting Blood Sugar - 140-170 Checks Blood Sugar __sometimes___    Blood Thinner Instructions:Plavix    last dose 09-28-24 per pt  Aspirin  Instructions:81 mg asa  ERAS Protcol - PRE-SURGERY n/a   COVID vaccine -yes  Activity--Able to climb a flight of stairs with no CP or SOB   Anesthesia review: CAD,CABG 08-12-24 ,Stroke 2023, Seizure,SVT, CKD, Ascending aorta aneurysm, DM2, OSA severe no Cpap yet  Patient denies shortness of breath, fever, cough and chest pain at PAT appointment   All instructions explained to the patient, with a verbal understanding of the material. Patient agrees to go over the instructions while at home for a better understanding. Patient also instructed to self quarantine after being tested for COVID-19. The opportunity to ask questions was provided.

## 2024-10-08 NOTE — Patient Instructions (Signed)
 SURGICAL WAITING ROOM VISITATION  Patients having surgery or a procedure may have no more than 2 support people in the waiting area - these visitors may rotate.    Children ages 46 and under will not be able to visit patients in [facility] under most circumstances.   Visitors with respiratory illnesses are discouraged from visiting and should remain at home.  If the patient needs to stay at the hospital during part of their recovery, the visitor guidelines for inpatient rooms apply. Pre-op nurse will coordinate an appropriate time for 1 support person to accompany patient in pre-op.  This support person may not rotate.    Please refer to the [facility] website for the visitor guidelines for Inpatients (after your surgery is over and you are in a regular room).       Your procedure is scheduled on: 10-13-24   Report to [facility] Main Entrance    Report to admitting at       0515   AM   Call this number if you have problems the morning of surgery [phone]   Do not eat food   or drink liquids :After Midnight.                         If you have questions, please contact your surgeons office.   FOLLOW ANY ADDITIONAL PRE OP INSTRUCTIONS YOU RECEIVED FROM YOUR SURGEON'S OFFICE!!!     Oral Hygiene is also important to reduce your risk of infection.                                    Remember - BRUSH YOUR TEETH THE MORNING OF SURGERY WITH YOUR REGULAR TOOTHPASTE  DENTURES WILL BE REMOVED PRIOR TO SURGERY PLEASE DO NOT APPLY Poly grip OR ADHESIVES!!!   Do NOT smoke after Midnight   Stop all vitamins and herbal supplements 7 days before surgery.   Take these medicines the morning of surgery with A SIP OF WATER: omeprazole, metoprolol (Toprol XL), zetia , Flonase   DO NOT TAKE ANY ORAL DIABETIC MEDICATIONS DAY OF YOUR SURGERY  Bring CPAP mask and tubing day of surgery.                              You may not have any metal on your body including hair  pins, jewelry, and body piercing             Do not wear make-up, lotions, powders, perfumes/cologne, or deodorant             Men may shave face and neck.   Do not bring valuables to the hospital. [facility] IS NOT             RESPONSIBLE   FOR VALUABLES.   Contacts, glasses, dentures or bridgework may not be worn into surgery.   Bring small overnight bag day of surgery.   DO NOT BRING YOUR HOME MEDICATIONS TO THE HOSPITAL. PHARMACY WILL DISPENSE MEDICATIONS LISTED ON YOUR MEDICATION LIST TO YOU DURING YOUR ADMISSION IN THE HOSPITAL!    Patients discharged on the day of surgery will not be allowed to drive home.  Someone NEEDS to stay with you for the first 24 hours after anesthesia.   Special Instructions: Bring a copy of your healthcare power of attorney and living will  documents the day of surgery if you haven't scanned them before.              Please read over the following fact sheets you were given: IF YOU HAVE QUESTIONS ABOUT YOUR PRE-OP INSTRUCTIONS PLEASE CALL [phone].    If you test positive for Covid or have been in contact with anyone that has tested positive in the last 10 days please notify you surgeon.    [facility] - Preparing for Surgery Before surgery, you can play an important role.  Because skin is not sterile, your skin needs to be as free of germs as possible.  You can reduce the number of germs on your skin by washing with CHG (chlorahexidine gluconate) soap before surgery.  CHG is an antiseptic cleaner which kills germs and bonds with the skin to continue killing germs even after washing. Please DO NOT use if you have an allergy to CHG or antibacterial soaps.  If your skin becomes reddened/irritated stop using the CHG and inform your nurse when you arrive at Short Stay. Do not shave (including legs and underarms) for at least 48 hours prior to the first CHG shower.  You may shave your face/neck.  Please follow these instructions carefully:  1.  Shower with  CHG Soap the night before surgery and the morning of surgery.  2.  If you choose to wash your hair, wash your hair first as usual with your normal  shampoo.  3.  After you shampoo, rinse your hair and body thoroughly to remove the shampoo.                             4.  Use CHG as you would any other liquid soap.  You can apply chg directly to the skin and wash.  Gently with a scrungie or clean washcloth.  5.  Apply the CHG Soap to your body ONLY FROM THE NECK DOWN.   Do  not use on face/ open                           Wound or open sores. Avoid contact with eyes, ears mouth and genitals (private parts).                       Wash face,  Genitals (private parts) with your normal soap.             6.  Wash thoroughly, paying special attention to the area where your  surgery  will be performed.  7.  Thoroughly rinse your body with warm water from the neck down.  8.  DO NOT shower/wash with your normal soap after using and rinsing off the CHG Soap.                9.  Pat yourself dry with a clean towel.            10.  Wear clean pajamas.            11.  Place clean sheets on your bed the night of your first shower and do not  sleep with pets. Day of Surgery : Do not apply any lotions/deodorants the morning of surgery.  Please wear clean clothes to the hospital/surgery center.  FAILURE TO FOLLOW THESE INSTRUCTIONS MAY RESULT IN THE CANCELLATION OF YOUR SURGERY  PATIENT SIGNATURE_________________________________  NURSE SIGNATURE__________________________________  ________________________________________________________________________

## 2024-10-09 ENCOUNTER — Encounter (HOSPITAL_COMMUNITY)
Admission: RE | Admit: 2024-10-09 | Discharge: 2024-10-09 | Disposition: A | Discharge status: Home / Self Care | Source: Ambulatory Visit | Attending: Cardiology | Admitting: Cardiology

## 2024-10-09 VITALS — BP 122/62 | HR 77 | Ht 68.0 in | Wt 183.0 lb

## 2024-10-09 DIAGNOSIS — Z951 Presence of aortocoronary bypass graft: Secondary | ICD-10-CM | POA: Insufficient documentation

## 2024-10-09 NOTE — Progress Notes (Signed)
 CARDIAC REHAB PHASE 2  Patient will not be able to attend his first day of class due to having a tumor removed. He knows to chat with his doctor and obtain clearance for when he can return and if he has any restrictions/limitations. He feels good with this and will let us  know when he finds out more information.   Richard Austin Finder ACSM-CEP 10/09/2024 11:31 AM

## 2024-10-09 NOTE — Progress Notes (Signed)
 Cardiac Individual Treatment Plan  Patient Details  Name: Richard Austin MRN: [id number] Date of Birth: [birth year] Referring Provider:   Flowsheet Row INTENSIVE CARDIAC REHAB ORIENT from 10/09/2024 in [facility]  Referring Provider Dr. Grayce ([facility]) Dr Wilbert Bihari MD covering    Initial Encounter Date:  Flowsheet Row INTENSIVE CARDIAC REHAB ORIENT from 10/09/2024 in [facility]  Date 10/09/24    Visit Diagnosis: 08/12/2024 S/P CABG x 1  Patient's Home Medications on Admission: Current Medications[1]  Past Medical History: Past Medical History:  Diagnosis Date   Arthritis    Bladder tumor    x3   Diverticulitis 2007   with blockage   HLD (hyperlipidemia)    Hypertension    Paroxysmal SVT (supraventricular tachycardia) 2001   had ablation   Pre-diabetes    Seizure (HCC) 10/02/2021   Stroke (HCC) 2023   difficulty swollowing, mouth droop anf numbness in hand Left side    Tobacco Use: Tobacco Use History[2]  Labs: Review Flowsheet  More data exists      Latest Ref Rng & Units 08/14/2018 09/21/2021 09/22/2021 10/02/2021 03/08/2022  Labs for ITP Cardiac and Pulmonary Rehab  Cholestrol 0 - 200 mg/dL - - 790  - -  LDL (calc) 0 - 99 mg/dL - - 862  - -  HDL-C >59 mg/dL - - 35  - -  Trlycerides <150 mg/dL - - 816  - -  Hemoglobin A1c 4.8 - 5.6 % 6.2  - 6.4  - 6.8   TCO2 22 - 32 mmol/L - 21  - 21  -    Capillary Blood Glucose: Lab Results  Component Value Date   GLUCAP 138 (H) 03/08/2022   GLUCAP 100 (H) 10/02/2021   GLUCAP 163 (H) 09/21/2021   GLUCAP 119 (H) 08/21/2018   GLUCAP 133 (H) 07/08/2018     Exercise Target Goals: Exercise Program Goal: Individual exercise prescription set using results from initial 6 min walk test and THRR while considering  patients activity barriers and safety.   Exercise Prescription Goal: Initial exercise  prescription builds to 30-45 minutes a day of aerobic activity, 2-3 days per week.  Home exercise guidelines will be given to patient during program as part of exercise prescription that the participant will acknowledge.  Activity Barriers & Risk Stratification:  Activity Barriers & Cardiac Risk Stratification - 10/09/24 1101       Activity Barriers & Cardiac Risk Stratification   Activity Barriers Arthritis;Deconditioning;Back Problems;Neck/Spine Problems    Cardiac Risk Stratification High          6 Minute Walk:  6 Minute Walk     Row Name 10/09/24 1059         6 Minute Walk   Phase Initial     Distance 1200 feet     Walk Time 6 minutes     # of Rest Breaks 0     MPH 2.27     METS 2.19     RPE 8     Perceived Dyspnea  0     VO2 Peak 7.66     Symptoms Yes (comment)     Comments Chronic right foot pain in arch, glute pain both 4/10 both resolve with rest     Resting HR 77 bpm     Resting BP 122/62     Resting Oxygen Saturation  98 %     Exercise  Oxygen Saturation  during 6 min walk 98 %     Max Ex. HR 84 bpm     Max Ex. BP 140/70     2 Minute Post BP 138/70        Oxygen Initial Assessment:   Oxygen Re-Evaluation:   Oxygen Discharge (Final Oxygen Re-Evaluation):   Initial Exercise Prescription:  Initial Exercise Prescription - 10/09/24 1100       Date of Initial Exercise RX and Referring Provider   Date 10/09/24    Referring Provider Dr. Grayce ([facility]) Dr Wilbert Bihari MD covering    Expected Discharge Date 12/31/24      NuStep   Level 2    SPM 70    Minutes 15    METs 2      Rower   Level 2    Watts 50    Minutes 15    METs 3      Prescription Details   Frequency (times per week) 3    Duration Progress to 30 minutes of continuous aerobic without signs/symptoms of physical distress      Intensity   THRR 40-80% of Max Heartrate 57-114    Ratings of Perceived Exertion 11-13    Perceived Dyspnea 0-4      Progression   Progression  Continue progressive overload as per policy without signs/symptoms or physical distress.      Resistance Training   Training Prescription Yes    Weight 5    Reps 10-15          Perform Capillary Blood Glucose checks as needed.  Exercise Prescription Changes:   Exercise Comments:   Exercise Goals and Review:   Exercise Goals     Row Name 10/09/24 1108             Exercise Goals   Increase Physical Activity Yes       Intervention Provide advice, education, support and counseling about physical activity/exercise needs.;Develop an individualized exercise prescription for aerobic and resistive training based on initial evaluation findings, risk stratification, comorbidities and participant's personal goals.       Expected Outcomes Short Term: Attend rehab on a regular basis to increase amount of physical activity.;Long Term: Exercising regularly at least 3-5 days a week.;Long Term: Add in home exercise to make exercise part of routine and to increase amount of physical activity.       Increase Strength and Stamina Yes       Intervention Provide advice, education, support and counseling about physical activity/exercise needs.;Develop an individualized exercise prescription for aerobic and resistive training based on initial evaluation findings, risk stratification, comorbidities and participant's personal goals.       Expected Outcomes Short Term: Increase workloads from initial exercise prescription for resistance, speed, and METs.;Short Term: Perform resistance training exercises routinely during rehab and add in resistance training at home;Long Term: Improve cardiorespiratory fitness, muscular endurance and strength as measured by increased METs and functional capacity ( )       Able to understand and use rate of perceived exertion (RPE) scale Yes       Intervention Provide education and explanation on how to use RPE scale       Expected Outcomes Short Term: Able to use RPE  daily in rehab to express subjective intensity level;Long Term:  Able to use RPE to guide intensity level when exercising independently       Knowledge and understanding of Target Heart Rate Range (THRR) Yes  Intervention Provide education and explanation of THRR including how the numbers were predicted and where they are located for reference       Expected Outcomes Short Term: Able to state/look up THRR;Long Term: Able to use THRR to govern intensity when exercising independently;Short Term: Able to use daily as guideline for intensity in rehab       Understanding of Exercise Prescription Yes       Intervention Provide education, explanation, and written materials on patient's individual exercise prescription       Expected Outcomes Short Term: Able to explain program exercise prescription;Long Term: Able to explain home exercise prescription to exercise independently          Exercise Goals Re-Evaluation :   Discharge Exercise Prescription (Final Exercise Prescription Changes):   Nutrition:  Target Goals: Understanding of nutrition guidelines, daily intake of sodium 1500mg , cholesterol 200mg , calories 30% from fat and 7% or less from saturated fats, daily to have 5 or more servings of fruits and vegetables.  Biometrics:  Pre Biometrics - 10/09/24 1108       Pre Biometrics   Height 5' 8 (1.727 m)    Weight 83 kg    Waist Circumference 39 inches    Hip Circumference 41 inches    Waist to Hip Ratio 0.95 %    BMI (Calculated) 27.83    Triceps Skinfold 16 mm    % Body Fat 27.7 %    Grip Strength 20 kg    Flexibility --   Unable to reach   Single Leg Stand 30 seconds           Nutrition Therapy Plan and Nutrition Goals:   Nutrition Assessments:  MEDIFICTS Score Key: >=70 Need to make dietary changes  40-70 Heart Healthy Diet <= 40 Therapeutic Level Cholesterol Diet    Picture Your Plate Scores: <59 Unhealthy dietary pattern with much room for  improvement. 41-50 Dietary pattern unlikely to meet recommendations for good health and room for improvement. 51-60 More healthful dietary pattern, with some room for improvement.  >60 Healthy dietary pattern, although there may be some specific behaviors that could be improved.    Nutrition Goals Re-Evaluation:   Nutrition Goals Re-Evaluation:   Nutrition Goals Discharge (Final Nutrition Goals Re-Evaluation):   Psychosocial: Target Goals: Acknowledge presence or absence of significant depression and/or stress, maximize coping skills, provide positive support system. Participant is able to verbalize types and ability to use techniques and skills needed for reducing stress and depression.  Initial Review & Psychosocial Screening:  Initial Psych Review & Screening - 10/09/24 1110       Initial Review   Current issues with Current Sleep Concerns      Family Dynamics   Good Support System? Yes   He has a good support system and has a therapist at the [facility] if he needs to talk with them     Screening Interventions   Interventions Encouraged to exercise;To provide support and resources with identified psychosocial needs;Provide feedback about the scores to participant    Expected Outcomes Long Term Goal: Stressors or current issues are controlled or eliminated.;Short Term goal: Identification and review with participant of any Quality of Life or Depression concerns found by scoring the questionnaire.;Long Term goal: The participant improves quality of Life and PHQ9 Scores as seen by post scores and/or verbalization of changes          Quality of Life Scores:  Quality of Life - 10/09/24 1112  Quality of Life   Select Quality of Life      Quality of Life Scores   Health/Function Pre 25.68 %    Socioeconomic Pre 29.14 %    Psych/Spiritual Pre 30 %    Family Pre 27.6 %    GLOBAL Pre 27.62 %         Scores of 19 and below usually indicate a poorer quality of life in these  areas.  A difference of  2-3 points is a clinically meaningful difference.  A difference of 2-3 points in the total score of the Quality of Life Index has been associated with significant improvement in overall quality of life, self-image, physical symptoms, and general health in studies assessing change in quality of life.  PHQ-9: Review Flowsheet  More data exists      10/09/2024 08/19/2024 01/02/2022 11/29/2021 11/25/2021  Depression screen PHQ 2/9  Decreased Interest 0 0 0 0 0  Down, Depressed, Hopeless 0 0 0 0 0  PHQ - 2 Score 0 0 0 0 0  Altered sleeping 1 - - - -  Tired, decreased energy 3 - - - -  Change in appetite 0 - - - -  Feeling bad or failure about yourself  0 - - - -  Trouble concentrating 1 - - - -  Moving slowly or fidgety/restless 0 - - - -  Suicidal thoughts 0 - - - -  PHQ-9 Score 5 - - - -  Difficult doing work/chores Not difficult at all - - - -   Interpretation of Total Score  Total Score Depression Severity:  1-4 = Minimal depression, 5-9 = Mild depression, 10-14 = Moderate depression, 15-19 = Moderately severe depression, 20-27 = Severe depression   Psychosocial Evaluation and Intervention:   Psychosocial Re-Evaluation:   Psychosocial Discharge (Final Psychosocial Re-Evaluation):   Vocational Rehabilitation: Provide vocational rehab assistance to qualifying candidates.   Vocational Rehab Evaluation & Intervention:  Vocational Rehab - 10/09/24 1116       Initial Vocational Rehab Evaluation & Intervention   Assessment shows need for Vocational Rehabilitation No   Retired         Education: Education Goals: Education classes will be provided on a weekly basis, covering required topics. Participant will state understanding/return demonstration of topics presented.     Core Videos: Exercise    Move It!  Clinical staff conducted group or individual video education with verbal and written material and guidebook.  Patient learns the recommended  Pritikin exercise program. Exercise with the goal of living a long, healthy life. Some of the health benefits of exercise include controlled diabetes, healthier blood pressure levels, improved cholesterol levels, improved heart and lung capacity, improved sleep, and better body composition. Everyone should speak with their doctor before starting or changing an exercise routine.  Biomechanical Limitations Clinical staff conducted group or individual video education with verbal and written material and guidebook.  Patient learns how biomechanical limitations can impact exercise and how we can mitigate and possibly overcome limitations to have an impactful and balanced exercise routine.  Body Composition Clinical staff conducted group or individual video education with verbal and written material and guidebook.  Patient learns that body composition (ratio of muscle mass to fat mass) is a key component to assessing overall fitness, rather than body weight alone. Increased fat mass, especially visceral belly fat, can put us  at increased risk for metabolic syndrome, type 2 diabetes, heart disease, and even death. It is recommended to combine diet and  exercise (cardiovascular and resistance training) to improve your body composition. Seek guidance from your physician and exercise physiologist before implementing an exercise routine.  Exercise Action Plan Clinical staff conducted group or individual video education with verbal and written material and guidebook.  Patient learns the recommended strategies to achieve and enjoy long-term exercise adherence, including variety, self-motivation, self-efficacy, and positive decision making. Benefits of exercise include fitness, good health, weight management, more energy, better sleep, less stress, and overall well-being.  Medical   Heart Disease Risk Reduction Clinical staff conducted group or individual video education with verbal and written material and  guidebook.  Patient learns our heart is our most vital organ as it circulates oxygen, nutrients, white blood cells, and hormones throughout the entire body, and carries waste away. Data supports a plant-based eating plan like the Pritikin Program for its effectiveness in slowing progression of and reversing heart disease. The video provides a number of recommendations to address heart disease.   Metabolic Syndrome and Belly Fat  Clinical staff conducted group or individual video education with verbal and written material and guidebook.  Patient learns what metabolic syndrome is, how it leads to heart disease, and how one can reverse it and keep it from coming back. You have metabolic syndrome if you have 3 of the following 5 criteria: abdominal obesity, high blood pressure, high triglycerides, low HDL cholesterol, and high blood sugar.  Hypertension and Heart Disease Clinical staff conducted group or individual video education with verbal and written material and guidebook.  Patient learns that high blood pressure, or hypertension, is very common in the [place] . Hypertension is largely due to excessive salt intake, but other important risk factors include being overweight, physical inactivity, drinking too much alcohol, smoking, and not eating enough potassium from fruits and vegetables. High blood pressure is a leading risk factor for heart attack, stroke, congestive heart failure, dementia, kidney failure, and premature death. Long-term effects of excessive salt intake include stiffening of the arteries and thickening of heart muscle and organ damage. Recommendations include ways to reduce hypertension and the risk of heart disease.  Diseases of Our Time - Focusing on Diabetes Clinical staff conducted group or individual video education with verbal and written material and guidebook.  Patient learns why the best way to stop diseases of our time is prevention, through food and other lifestyle  changes. Medicine (such as prescription pills and surgeries) is often only a Band-Aid on the problem, not a long-term solution. Most common diseases of our time include obesity, type 2 diabetes, hypertension, heart disease, and cancer. The Pritikin Program is recommended and has been proven to help reduce, reverse, and/or prevent the damaging effects of metabolic syndrome.  Nutrition   Overview of the Pritikin Eating Plan  Clinical staff conducted group or individual video education with verbal and written material and guidebook.  Patient learns about the Pritikin Eating Plan for disease risk reduction. The Pritikin Eating Plan emphasizes a wide variety of unrefined, minimally-processed carbohydrates, like fruits, vegetables, whole grains, and legumes. Go, Caution, and Stop food choices are explained. Plant-based and lean animal proteins are emphasized. Rationale provided for low sodium intake for blood pressure control, low added sugars for blood sugar stabilization, and low added fats and oils for coronary artery disease risk reduction and weight management.  Calorie Density  Clinical staff conducted group or individual video education with verbal and written material and guidebook.  Patient learns about calorie density and how it impacts the Pritikin Eating Plan. Knowing  the characteristics of the food you choose will help you decide whether those foods will lead to weight gain or weight loss, and whether you want to consume more or less of them. Weight loss is usually a side effect of the Pritikin Eating Plan because of its focus on low calorie-dense foods.  Label Reading  Clinical staff conducted group or individual video education with verbal and written material and guidebook.  Patient learns about the Pritikin recommended label reading guidelines and corresponding recommendations regarding calorie density, added sugars, sodium content, and whole grains.  Dining Out - Part 1  Clinical staff  conducted group or individual video education with verbal and written material and guidebook.  Patient learns that restaurant meals can be sabotaging because they can be so high in calories, fat, sodium, and/or sugar. Patient learns recommended strategies on how to positively address this and avoid unhealthy pitfalls.  Facts on Fats  Clinical staff conducted group or individual video education with verbal and written material and guidebook.  Patient learns that lifestyle modifications can be just as effective, if not more so, as many medications for lowering your risk of heart disease. A Pritikin lifestyle can help to reduce your risk of inflammation and atherosclerosis (cholesterol build-up, or plaque, in the artery walls). Lifestyle interventions such as dietary choices and physical activity address the cause of atherosclerosis. A review of the types of fats and their impact on blood cholesterol levels, along with dietary recommendations to reduce fat intake is also included.  Nutrition Action Plan  Clinical staff conducted group or individual video education with verbal and written material and guidebook.  Patient learns how to incorporate Pritikin recommendations into their lifestyle. Recommendations include planning and keeping personal health goals in mind as an important part of their success.  Healthy Mind-Set    Healthy Minds, Bodies, Hearts  Clinical staff conducted group or individual video education with verbal and written material and guidebook.  Patient learns how to identify when they are stressed. Video will discuss the impact of that stress, as well as the many benefits of stress management. Patient will also be introduced to stress management techniques. The way we think, act, and feel has an impact on our hearts.  How Our Thoughts Can Heal Our Hearts  Clinical staff conducted group or individual video education with verbal and written material and guidebook.  Patient learns that  negative thoughts can cause depression and anxiety. This can result in negative lifestyle behavior and serious health problems. Cognitive behavioral therapy is an effective method to help control our thoughts in order to change and improve our emotional outlook.  Additional Videos:  Exercise    Improving Performance  Clinical staff conducted group or individual video education with verbal and written material and guidebook.  Patient learns to use a non-linear approach by alternating intensity levels and lengths of time spent exercising to help burn more calories and lose more body fat. Cardiovascular exercise helps improve heart health, metabolism, hormonal balance, blood sugar control, and recovery from fatigue. Resistance training improves strength, endurance, balance, coordination, reaction time, metabolism, and muscle mass. Flexibility exercise improves circulation, posture, and balance. Seek guidance from your physician and exercise physiologist before implementing an exercise routine and learn your capabilities and proper form for all exercise.  Introduction to Yoga  Clinical staff conducted group or individual video education with verbal and written material and guidebook.  Patient learns about yoga, a discipline of the coming together of mind, breath, and body. The benefits  of yoga include improved flexibility, improved range of motion, better posture and core strength, increased lung function, weight loss, and positive self-image. Yogas heart health benefits include lowered blood pressure, healthier heart rate, decreased cholesterol and triglyceride levels, improved immune function, and reduced stress. Seek guidance from your physician and exercise physiologist before implementing an exercise routine and learn your capabilities and proper form for all exercise.  Medical   Aging: Enhancing Your Quality of Life  Clinical staff conducted group or individual video education with verbal and  written material and guidebook.  Patient learns key strategies and recommendations to stay in good physical health and enhance quality of life, such as prevention strategies, having an advocate, securing a Health Care Proxy and Power of Attorney, and keeping a list of medications and system for tracking them. It also discusses how to avoid risk for bone loss.  Biology of Weight Control  Clinical staff conducted group or individual video education with verbal and written material and guidebook.  Patient learns that weight gain occurs because we consume more calories than we burn (eating more, moving less). Even if your body weight is normal, you may have higher ratios of fat compared to muscle mass. Too much body fat puts you at increased risk for cardiovascular disease, heart attack, stroke, type 2 diabetes, and obesity-related cancers. In addition to exercise, following the Pritikin Eating Plan can help reduce your risk.  Decoding Lab Results  Clinical staff conducted group or individual video education with verbal and written material and guidebook.  Patient learns that lab test reflects one measurement whose values change over time and are influenced by many factors, including medication, stress, sleep, exercise, food, hydration, pre-existing medical conditions, and more. It is recommended to use the knowledge from this video to become more involved with your lab results and evaluate your numbers to speak with your doctor.   Diseases of Our Time - Overview  Clinical staff conducted group or individual video education with verbal and written material and guidebook.  Patient learns that according to the CDC, 50% to 70% of chronic diseases (such as obesity, type 2 diabetes, elevated lipids, hypertension, and heart disease) are avoidable through lifestyle improvements including healthier food choices, listening to satiety cues, and increased physical activity.  Sleep Disorders Clinical staff  conducted group or individual video education with verbal and written material and guidebook.  Patient learns how good quality and duration of sleep are important to overall health and well-being. Patient also learns about sleep disorders and how they impact health along with recommendations to address them, including discussing with a physician.  Nutrition  Dining Out - Part 2 Clinical staff conducted group or individual video education with verbal and written material and guidebook.  Patient learns how to plan ahead and communicate in order to maximize their dining experience in a healthy and nutritious manner. Included are recommended food choices based on the type of restaurant the patient is visiting.   F[employer] conducted group or individual video education with verbal and written material and guidebook.  There is a strong connection between our food choices and our health. Diseases like obesity and type 2 diabetes are very prevalent and are in large-part due to lifestyle choices. The Pritikin Eating Plan provides plenty of food and hunger-curbing satisfaction. It is easy to follow, affordable, and helps reduce health risks.  Menu Workshop  Clinical staff conducted group or individual video education with verbal and written material and guidebook.  Patient learns that restaurant meals can sabotage health goals because they are often packed with calories, fat, sodium, and sugar. Recommendations include strategies to plan ahead and to communicate with the manager, chef, or server to help order a healthier meal.  Planning Your Eating Strategy  Clinical staff conducted group or individual video education with verbal and written material and guidebook.  Patient learns about the Pritikin Eating Plan and its benefit of reducing the risk of disease. The Pritikin Eating Plan does not focus on calories. Instead, it emphasizes high-quality, nutrient-rich foods. By knowing  the characteristics of the foods, we choose, we can determine their calorie density and make informed decisions.  Targeting Your Nutrition Priorities  Clinical staff conducted group or individual video education with verbal and written material and guidebook.  Patient learns that lifestyle habits have a tremendous impact on disease risk and progression. This video provides eating and physical activity recommendations based on your personal health goals, such as reducing LDL cholesterol, losing weight, preventing or controlling type 2 diabetes, and reducing high blood pressure.  Vitamins and Minerals  Clinical staff conducted group or individual video education with verbal and written material and guidebook.  Patient learns different ways to obtain key vitamins and minerals, including through a recommended healthy diet. It is important to discuss all supplements you take with your doctor.   Healthy Mind-Set    Smoking Cessation  Clinical staff conducted group or individual video education with verbal and written material and guidebook.  Patient learns that cigarette smoking and tobacco addiction pose a serious health risk which affects millions of people. Stopping smoking will significantly reduce the risk of heart disease, lung disease, and many forms of cancer. Recommended strategies for quitting are covered, including working with your doctor to develop a successful plan.  Culinary   Becoming a Set Designer conducted group or individual video education with verbal and written material and guidebook.  Patient learns that cooking at home can be healthy, cost-effective, quick, and puts them in control. Keys to cooking healthy recipes will include looking at your recipe, assessing your equipment needs, planning ahead, making it simple, choosing cost-effective seasonal ingredients, and limiting the use of added fats, salts, and sugars.  Cooking - Breakfast and Snacks  Clinical  staff conducted group or individual video education with verbal and written material and guidebook.  Patient learns how important breakfast is to satiety and nutrition through the entire day. Recommendations include key foods to eat during breakfast to help stabilize blood sugar levels and to prevent overeating at meals later in the day. Planning ahead is also a key component.  Cooking - Educational Psychologist conducted group or individual video education with verbal and written material and guidebook.  Patient learns eating strategies to improve overall health, including an approach to cook more at home. Recommendations include thinking of animal protein as a side on your plate rather than center stage and focusing instead on lower calorie dense options like vegetables, fruits, whole grains, and plant-based proteins, such as beans. Making sauces in large quantities to freeze for later and leaving the skin on your vegetables are also recommended to maximize your experience.  Cooking - Healthy Salads and Dressing Clinical staff conducted group or individual video education with verbal and written material and guidebook.  Patient learns that vegetables, fruits, whole grains, and legumes are the foundations of the Pritikin Eating Plan. Recommendations include how to incorporate each of these in flavorful and  healthy salads, and how to create homemade salad dressings. Proper handling of ingredients is also covered. Cooking - Soups and [employer] - Soups and Desserts Clinical staff conducted group or individual video education with verbal and written material and guidebook.  Patient learns that Pritikin soups and desserts make for easy, nutritious, and delicious snacks and meal components that are low in sodium, fat, sugar, and calorie density, while high in vitamins, minerals, and filling fiber. Recommendations include simple and healthy ideas for soups and desserts.   Overview     The  Pritikin Solution Program Overview Clinical staff conducted group or individual video education with verbal and written material and guidebook.  Patient learns that the results of the Pritikin Program have been documented in more than 100 articles published in peer-reviewed journals, and the benefits include reducing risk factors for (and, in some cases, even reversing) high cholesterol, high blood pressure, type 2 diabetes, obesity, and more! An overview of the three key pillars of the Pritikin Program will be covered: eating well, doing regular exercise, and having a healthy mind-set.  WORKSHOPS  Exercise: Exercise Basics: Building Your Action Plan Clinical staff led group instruction and group discussion with PowerPoint presentation and patient guidebook. To enhance the learning environment the use of posters, models and videos may be added. At the conclusion of this workshop, patients will comprehend the difference between physical activity and exercise, as well as the benefits of incorporating both, into their routine. Patients will understand the FITT (Frequency, Intensity, Time, and Type) principle and how to use it to build an exercise action plan. In addition, safety concerns and other considerations for exercise and cardiac rehab will be addressed by the presenter. The purpose of this lesson is to promote a comprehensive and effective weekly exercise routine in order to improve patients overall level of fitness.   Managing Heart Disease: Your Path to a Healthier Heart Clinical staff led group instruction and group discussion with PowerPoint presentation and patient guidebook. To enhance the learning environment the use of posters, models and videos may be added.At the conclusion of this workshop, patients will understand the anatomy and physiology of the heart. Additionally, they will understand how Pritikins three pillars impact the risk factors, the progression, and the management  of heart disease.  The purpose of this lesson is to provide a high-level overview of the heart, heart disease, and how the Pritikin lifestyle positively impacts risk factors.  Exercise Biomechanics Clinical staff led group instruction and group discussion with PowerPoint presentation and patient guidebook. To enhance the learning environment the use of posters, models and videos may be added. Patients will learn how the structural parts of their bodies function and how these functions impact their daily activities, movement, and exercise. Patients will learn how to promote a neutral spine, learn how to manage pain, and identify ways to improve their physical movement in order to promote healthy living. The purpose of this lesson is to expose patients to common physical limitations that impact physical activity. Participants will learn practical ways to adapt and manage aches and pains, and to minimize their effect on regular exercise. Patients will learn how to maintain good posture while sitting, walking, and lifting.  Balance Training and Fall Prevention  Clinical staff led group instruction and group discussion with PowerPoint presentation and patient guidebook. To enhance the learning environment the use of posters, models and videos may be added. At the conclusion of this workshop, patients will understand the importance of their  sensorimotor skills (vision, proprioception, and the vestibular system) in maintaining their ability to balance as they age. Patients will apply a variety of balancing exercises that are appropriate for their current level of function. Patients will understand the common causes for poor balance, possible solutions to these problems, and ways to modify their physical environment in order to minimize their fall risk. The purpose of this lesson is to teach patients about the importance of maintaining balance as they age and ways to minimize their risk of  falling.  WORKSHOPS   Nutrition:  Fueling a Ship Broker led group instruction and group discussion with PowerPoint presentation and patient guidebook. To enhance the learning environment the use of posters, models and videos may be added. Patients will review the foundational principles of the Pritikin Eating Plan and understand what constitutes a serving size in each of the food groups. Patients will also learn Pritikin-friendly foods that are better choices when away from home and review make-ahead meal and snack options. Calorie density will be reviewed and applied to three nutrition priorities: weight maintenance, weight loss, and weight gain. The purpose of this lesson is to reinforce (in a group setting) the key concepts around what patients are recommended to eat and how to apply these guidelines when away from home by planning and selecting Pritikin-friendly options. Patients will understand how calorie density may be adjusted for different weight management goals.  Mindful Eating  Clinical staff led group instruction and group discussion with PowerPoint presentation and patient guidebook. To enhance the learning environment the use of posters, models and videos may be added. Patients will briefly review the concepts of the Pritikin Eating Plan and the importance of low-calorie dense foods. The concept of mindful eating will be introduced as well as the importance of paying attention to internal hunger signals. Triggers for non-hunger eating and techniques for dealing with triggers will be explored. The purpose of this lesson is to provide patients with the opportunity to review the basic principles of the Pritikin Eating Plan, discuss the value of eating mindfully and how to measure internal cues of hunger and fullness using the Hunger Scale. Patients will also discuss reasons for non-hunger eating and learn strategies to use for controlling emotional eating.  Targeting Your  Nutrition Priorities Clinical staff led group instruction and group discussion with PowerPoint presentation and patient guidebook. To enhance the learning environment the use of posters, models and videos may be added. Patients will learn how to determine their genetic susceptibility to disease by reviewing their family history. Patients will gain insight into the importance of diet as part of an overall healthy lifestyle in mitigating the impact of genetics and other environmental insults. The purpose of this lesson is to provide patients with the opportunity to assess their personal nutrition priorities by looking at their family history, their own health history and current risk factors. Patients will also be able to discuss ways of prioritizing and modifying the Pritikin Eating Plan for their highest risk areas  Menu  Clinical staff led group instruction and group discussion with PowerPoint presentation and patient guidebook. To enhance the learning environment the use of posters, models and videos may be added. Using menus brought in from [employer], or printed from [employer], patients will apply the Pritikin dining out guidelines that were presented in the [employer] video. Patients will also be able to practice these guidelines in a variety of provided scenarios. The purpose of this lesson is to  provide patients with the opportunity to practice hands-on learning of the [employer] guidelines with actual menus and practice scenarios.  Label Reading Clinical staff led group instruction and group discussion with PowerPoint presentation and patient guidebook. To enhance the learning environment the use of posters, models and videos may be added. Patients will review and discuss the Pritikin label reading guidelines presented in Pritikins Label Reading Educational series video. Using fool labels brought in from local grocery stores and markets, patients will apply the  label reading guidelines and determine if the packaged food meet the Pritikin guidelines. The purpose of this lesson is to provide patients with the opportunity to review, discuss, and practice hands-on learning of the Pritikin Label Reading guidelines with actual packaged food labels. Cooking School  Pritikins [employer] are designed to teach patients ways to prepare quick, simple, and affordable recipes at home. The importance of nutritions role in chronic disease risk reduction is reflected in its emphasis in the overall Pritikin program. By learning how to prepare essential core Pritikin Eating Plan recipes, patients will increase control over what they eat; be able to customize the flavor of foods without the use of added salt, sugar, or fat; and improve the quality of the food they consume. By learning a set of core recipes which are easily assembled, quickly prepared, and affordable, patients are more likely to prepare more healthy foods at home. These workshops focus on convenient breakfasts, simple entres, side dishes, and desserts which can be prepared with minimal effort and are consistent with nutrition recommendations for cardiovascular risk reduction. Cooking [employer] are taught by a armed forces logistics/support/administrative officer (RD) who has been trained by the [employer]. The chef or RD has a clear understanding of the importance of minimizing - if not completely eliminating - added fat, sugar, and sodium in recipes. Throughout the series of Cooking School Workshop sessions, patients will learn about healthy ingredients and efficient methods of cooking to build confidence in their capability to prepare    Cooking School weekly topics:  Adding Flavor- Sodium-Free  Fast and Healthy Breakfasts  Powerhouse Plant-Based Proteins  Satisfying Salads and Dressings  Simple Sides and Sauces  International Cuisine-Spotlight on the [employer] Zones  Delicious Desserts  Savory  Soups  [employer] - Meals in a Astronomer Appetizers and Snacks  Comforting Weekend Breakfasts  One-Pot Wonders   Fast Evening Meals  Landscape Architect Your Pritikin Plate  WORKSHOPS   Healthy Mindset (Psychosocial):  Focused Goals, Sustainable Changes Clinical staff led group instruction and group discussion with PowerPoint presentation and patient guidebook. To enhance the learning environment the use of posters, models and videos may be added. Patients will be able to apply effective goal setting strategies to establish at least one personal goal, and then take consistent, meaningful action toward that goal. They will learn to identify common barriers to achieving personal goals and develop strategies to overcome them. Patients will also gain an understanding of how our mind-set can impact our ability to achieve goals and the importance of cultivating a positive and growth-oriented mind-set. The purpose of this lesson is to provide patients with a deeper understanding of how to set and achieve personal goals, as well as the tools and strategies needed to overcome common obstacles which may arise along the way.  From Head to Heart: The Power of a Healthy Outlook  Clinical staff led group instruction and group discussion with PowerPoint presentation and patient guidebook.  To enhance the learning environment the use of posters, models and videos may be added. Patients will be able to recognize and describe the impact of emotions and mood on physical health. They will discover the importance of self-care and explore self-care practices which may work for them. Patients will also learn how to utilize the 4 Cs to cultivate a healthier outlook and better manage stress and challenges. The purpose of this lesson is to demonstrate to patients how a healthy outlook is an essential part of maintaining good health, especially as they continue their cardiac rehab journey.  Healthy  Sleep for a Healthy Heart Clinical staff led group instruction and group discussion with PowerPoint presentation and patient guidebook. To enhance the learning environment the use of posters, models and videos may be added. At the conclusion of this workshop, patients will be able to demonstrate knowledge of the importance of sleep to overall health, well-being, and quality of life. They will understand the symptoms of, and treatments for, common sleep disorders. Patients will also be able to identify daytime and nighttime behaviors which impact sleep, and they will be able to apply these tools to help manage sleep-related challenges. The purpose of this lesson is to provide patients with a general overview of sleep and outline the importance of quality sleep. Patients will learn about a few of the most common sleep disorders. Patients will also be introduced to the concept of sleep hygiene, and discover ways to self-manage certain sleeping problems through simple daily behavior changes. Finally, the workshop will motivate patients by clarifying the links between quality sleep and their goals of heart-healthy living.   Recognizing and Reducing Stress Clinical staff led group instruction and group discussion with PowerPoint presentation and patient guidebook. To enhance the learning environment the use of posters, models and videos may be added. At the conclusion of this workshop, patients will be able to understand the types of stress reactions, differentiate between acute and chronic stress, and recognize the impact that chronic stress has on their health. They will also be able to apply different coping mechanisms, such as reframing negative self-talk. Patients will have the opportunity to practice a variety of stress management techniques, such as deep abdominal breathing, progressive muscle relaxation, and/or guided imagery.  The purpose of this lesson is to educate patients on the role of stress in their  lives and to provide healthy techniques for coping with it.  Learning Barriers/Preferences:  Learning Barriers/Preferences - 10/09/24 1112       Learning Barriers/Preferences   Learning Barriers Sight;Exercise Concerns   Some fine motor skill issues and chronic pain   Learning Preferences Group Instruction;Individual Instruction;Computer/Internet;Skilled Demonstration          Education Topics:  Knowledge Questionnaire Score:  Knowledge Questionnaire Score - 10/09/24 1114       Knowledge Questionnaire Score   Pre Score 22/24          Core Components/Risk Factors/Patient Goals at Admission:  Personal Goals and Risk Factors at Admission - 10/09/24 1116       Core Components/Risk Factors/Patient Goals on Admission    Weight Management Yes;Obesity    Intervention Weight Management: Develop a combined nutrition and exercise program designed to reach desired caloric intake, while maintaining appropriate intake of nutrient and fiber, sodium and fats, and appropriate energy expenditure required for the weight goal.;Weight Management: Provide education and appropriate resources to help participant work on and attain dietary goals.;Weight Management/Obesity: Establish reasonable short term and long term weight goals.;Obesity:  Provide education and appropriate resources to help participant work on and attain dietary goals.    Expected Outcomes Short Term: Continue to assess and modify interventions until short term weight is achieved;Long Term: Adherence to nutrition and physical activity/exercise program aimed toward attainment of established weight goal;Weight Loss: Understanding of general recommendations for a balanced deficit meal plan, which promotes 1-2 lb weight loss per week and includes a negative energy balance of [phone] kcal/d;Understanding recommendations for meals to include 15-35% energy as protein, 25-35% energy from fat, 35-60% energy from carbohydrates, less than 200mg  of  dietary cholesterol, 20-35 gm of total fiber daily;Understanding of distribution of calorie intake throughout the day with the consumption of 4-5 meals/snacks    Diabetes Yes    Intervention Provide education about signs/symptoms and action to take for hypo/hyperglycemia.;Provide education about proper nutrition, including hydration, and aerobic/resistive exercise prescription along with prescribed medications to achieve blood glucose in normal ranges: Fasting glucose 65-99 mg/dL    Expected Outcomes Short Term: Participant verbalizes understanding of the signs/symptoms and immediate care of hyper/hypoglycemia, proper foot care and importance of medication, aerobic/resistive exercise and nutrition plan for blood glucose control.;Long Term: Attainment of HbA1C < 7%.    Hypertension Yes    Intervention Provide education on lifestyle modifcations including regular physical activity/exercise, weight management, moderate sodium restriction and increased consumption of fresh fruit, vegetables, and low fat dairy, alcohol moderation, and smoking cessation.;Monitor prescription use compliance.    Expected Outcomes Short Term: Continued assessment and intervention until BP is < 140/40mm HG in hypertensive participants. < 130/62mm HG in hypertensive participants with diabetes, heart failure or chronic kidney disease.;Long Term: Maintenance of blood pressure at goal levels.    Lipids Yes    Intervention Provide education and support for participant on nutrition & aerobic/resistive exercise along with prescribed medications to achieve LDL 70mg , HDL >40mg .    Expected Outcomes Short Term: Participant states understanding of desired cholesterol values and is compliant with medications prescribed. Participant is following exercise prescription and nutrition guidelines.;Long Term: Cholesterol controlled with medications as prescribed, with individualized exercise RX and with personalized nutrition plan. Value goals: LDL <  70mg , HDL > 40 mg.    Personal Goal Other Yes    Personal Goal ST: Increase ROM and Flexibility LT stay healthy, prevent another stroke    Intervention Will continue to monitor pt and progress workloads as tolerated without sign or symptom    Expected Outcomes Pt will achieve his goals          Core Components/Risk Factors/Patient Goals Review:    Core Components/Risk Factors/Patient Goals at Discharge (Final Review):    ITP Comments:  ITP Comments     Row Name 10/09/24 0749           ITP Comments Wilbert Holland, MD: Medical Director. Introduction to the Pritikin Education Program/Intensive Cardiac Rehab. Intial orientation packet reviewed with the patient.          Comments: Participant attended orientation for the cardiac rehabilitation program on  10/09/2024  to perform initial intake and exercise walk test. Patient introduced to the Pritikin Program education and orientation packet was reviewed. Completed 6-minute walk test, measurements, initial ITP, and exercise prescription. Vital signs stable. Telemetry-normal sinus rhythm, asymptomatic.   Service time was from 0750 to 1036.        [1]  Current Outpatient Medications:    Alirocumab  (PRALUENT ) 150 MG/ML SOAJ, Inject 150 mg into the skin every 14 (fourteen) days., Disp: , Rfl:  aspirin  EC 81 MG tablet, Take 81 mg by mouth daily. Swallow whole., Disp: , Rfl:    chlorthalidone  (HYGROTON ) 25 MG tablet, Take 25 mg by mouth daily., Disp: , Rfl:    clopidogrel  (PLAVIX ) 75 MG tablet, Take 75 mg by mouth in the morning., Disp: , Rfl:    ezetimibe  (ZETIA ) 10 MG tablet, Take 1 tablet (10 mg total) by mouth daily., Disp: 30 tablet, Rfl: 1   fluticasone  (FLONASE ) 50 MCG/ACT nasal spray, Place 2 sprays into both nostrils 2 (two) times daily as needed for allergies or rhinitis. , Disp: , Rfl:    furosemide (LASIX) 20 MG tablet, Take 20 mg by mouth daily as needed (daily as needed for lower extremity swelling or weight gain >3 pounds  in 1 day or >5 pounds in 1 week Call provider if having to take Lasix)., Disp: , Rfl:    metoprolol succinate (TOPROL-XL) 50 MG 24 hr tablet, Take 25 mg by mouth in the morning., Disp: , Rfl:    Multiple Vitamin (MULTIVITAMIN) tablet, Take 1 tablet by mouth daily., Disp: , Rfl:    omeprazole (PRILOSEC) 20 MG capsule, Take 20 mg by mouth daily., Disp: , Rfl:    tadalafil (CIALIS) 20 MG tablet, Take 20 mg by mouth daily as needed. Very high blood pressure with fluctuations, Disp: , Rfl:    azelastine (ASTELIN) 0.1 % nasal spray, Place 1 spray into both nostrils 2 (two) times daily as needed for rhinitis or allergies. Use in each nostril as directed (Patient not taking: Reported on 10/09/2024), Disp: , Rfl:    losartan (COZAAR) 100 MG tablet, Take 100 mg by mouth daily. (Patient not taking: Reported on 10/09/2024), Disp: , Rfl:    MAGNESIUM GLYCINATE PO, Take 240 mg by mouth daily. (Patient not taking: Reported on 10/09/2024), Disp: , Rfl:  No current facility-administered medications for this encounter.  Facility-Administered Medications Ordered in Other Encounters:    gemcitabine  (GEMZAR ) chemo syringe for bladder instillation 2,000 mg, 2,000 mg, Bladder Instillation, Once, Bell, Sherwood BIRCH III, MD [2]  Social History Tobacco Use  Smoking Status Former   Current packs/day: 0.00   Average packs/day: 1.5 packs/day for 30.0 years (45.0 ttl pk-yrs)   Types: Cigarettes   Start date: 45   Quit date: 2015   Years since quitting: 11.0  Smokeless Tobacco Never  Tobacco Comments   QUIT 15 YRS AGO

## 2024-10-09 NOTE — Progress Notes (Signed)
 Cardiac Rehab Medication Review   Does the patient  feel that his/her medications are working for him/her?  yes  Has the patient been experiencing any side effects to the medications prescribed?  no  Does the patient measure his/her own blood pressure or blood glucose at home?  yes   Does the patient have any problems obtaining medications due to transportation or finances?   no  Understanding of regimen: excellent Understanding of indications: excellent Potential of compliance: excellent    Comments: Patient has a good understanding of his medications and routines. BP's are starting to improve with home readings. Good readings in cardiac rehab today.     Richard Austin Finder 10/09/2024 11:20 AM

## 2024-10-10 ENCOUNTER — Other Ambulatory Visit: Payer: Self-pay

## 2024-10-10 ENCOUNTER — Encounter (HOSPITAL_COMMUNITY)
Admission: RE | Admit: 2024-10-10 | Discharge: 2024-10-10 | Disposition: A | Source: Ambulatory Visit | Attending: Urology

## 2024-10-10 ENCOUNTER — Encounter (HOSPITAL_COMMUNITY): Payer: Self-pay

## 2024-10-10 VITALS — BP 133/78 | HR 87 | Temp 98.0°F | Resp 16 | Ht 68.5 in | Wt 179.0 lb

## 2024-10-10 DIAGNOSIS — E119 Type 2 diabetes mellitus without complications: Secondary | ICD-10-CM | POA: Insufficient documentation

## 2024-10-10 DIAGNOSIS — Z8673 Personal history of transient ischemic attack (TIA), and cerebral infarction without residual deficits: Secondary | ICD-10-CM | POA: Insufficient documentation

## 2024-10-10 DIAGNOSIS — Z01818 Encounter for other preprocedural examination: Secondary | ICD-10-CM | POA: Insufficient documentation

## 2024-10-10 DIAGNOSIS — C670 Malignant neoplasm of trigone of bladder: Secondary | ICD-10-CM | POA: Insufficient documentation

## 2024-10-10 DIAGNOSIS — Z7982 Long term (current) use of aspirin: Secondary | ICD-10-CM | POA: Insufficient documentation

## 2024-10-10 DIAGNOSIS — Z7902 Long term (current) use of antithrombotics/antiplatelets: Secondary | ICD-10-CM | POA: Insufficient documentation

## 2024-10-10 DIAGNOSIS — Z79899 Other long term (current) drug therapy: Secondary | ICD-10-CM | POA: Insufficient documentation

## 2024-10-10 HISTORY — DX: Malignant (primary) neoplasm, unspecified: C80.1

## 2024-10-10 HISTORY — DX: Acute myocardial infarction, unspecified: I21.9

## 2024-10-10 HISTORY — DX: Pneumonia, unspecified organism: J18.9

## 2024-10-10 HISTORY — DX: Sleep apnea, unspecified: G47.30

## 2024-10-10 HISTORY — DX: Atherosclerotic heart disease of native coronary artery without angina pectoris: I25.10

## 2024-10-10 HISTORY — DX: Aneurysm of the ascending aorta, without rupture: I71.21

## 2024-10-10 LAB — CBC
HCT: 42.2 % (ref 39.0–52.0)
Hemoglobin: 13.5 g/dL (ref 13.0–17.0)
MCH: 29.3 pg (ref 26.0–34.0)
MCHC: 32 g/dL (ref 30.0–36.0)
MCV: 91.7 fL (ref 80.0–100.0)
Platelets: 251 10*3/uL (ref 150–400)
RBC: 4.6 MIL/uL (ref 4.22–5.81)
RDW: 13.1 % (ref 11.5–15.5)
WBC: 6.4 10*3/uL (ref 4.0–10.5)
nRBC: 0 % (ref 0.0–0.2)

## 2024-10-10 LAB — BASIC METABOLIC PANEL WITH GFR
Anion gap: 12 (ref 5–15)
BUN: 32 mg/dL — ABNORMAL HIGH (ref 8–23)
CO2: 23 mmol/L (ref 22–32)
Calcium: 9.9 mg/dL (ref 8.9–10.3)
Chloride: 103 mmol/L (ref 98–111)
Creatinine, Ser: 1.19 mg/dL (ref 0.61–1.24)
GFR, Estimated: >60 mL/min
Glucose, Bld: 167 mg/dL — ABNORMAL HIGH (ref 70–99)
Potassium: 4.1 mmol/L (ref 3.5–5.1)
Sodium: 138 mmol/L (ref 135–145)

## 2024-10-10 LAB — HEMOGLOBIN A1C
Hgb A1c MFr Bld: 7.4 % — ABNORMAL HIGH (ref 4.8–5.6)
Mean Plasma Glucose: 165.68 mg/dL

## 2024-10-10 NOTE — Anesthesia Preprocedure Evaluation (Signed)
 "                                  Anesthesia Evaluation  Patient identified by MRN, date of birth, ID band Patient awake    Reviewed: Allergy & Precautions, NPO status , Patient's Chart, lab work & pertinent test results  History of Anesthesia Complications Negative for: history of anesthetic complications  Airway Mallampati: I  TM Distance: >3 FB Neck ROM: Full    Dental  (+) Teeth Intact, Dental Advisory Given   Pulmonary neg shortness of breath, sleep apnea , neg COPD, neg recent URI, former smoker   breath sounds clear to auscultation       Cardiovascular hypertension, Pt. on medications and Pt. on home beta blockers + CAD, + Past MI and + CABG   Rhythm:Regular     Neuro/Psych Seizures -, Well Controlled,  Left UE symptoms  Neuromuscular disease CVA, Residual Symptoms    GI/Hepatic ,GERD  Medicated and Controlled,,  Endo/Other  diabetes, Type 2  Lab Results      Component                Value               Date                      HGBA1C                   7.4 (H)             10/10/2024             Renal/GU Renal diseaseLab Results      Component                Value               Date                      NA                       138                 10/10/2024                K                        4.1                 10/10/2024                CO2                      23                  10/10/2024                GLUCOSE                  167 (H)             10/10/2024                BUN                      32 (H)  10/10/2024                CREATININE               1.19                10/10/2024                CALCIUM                  9.9                 10/10/2024                EGFR                     54 (L)              01/02/2022                GFRNONAA                 >60                 10/10/2024                Musculoskeletal  (+) Arthritis ,    Abdominal   Peds  Hematology Lab Results      Component                 Value               Date                      WBC                      6.4                 10/10/2024                HGB                      13.5                10/10/2024                HCT                      42.2                10/10/2024                MCV                      91.7                10/10/2024                PLT                      251                 10/10/2024            plavix    Anesthesia Other Findings   Reproductive/Obstetrics                              Anesthesia Physical Anesthesia  Plan  ASA: 3  Anesthesia Plan: General   Post-op Pain Management:    Induction: Intravenous  PONV Risk Score and Plan: 3 and Ondansetron  and Dexamethasone   Airway Management Planned: Oral ETT  Additional Equipment: None  Intra-op Plan:   Post-operative Plan: Extubation in OR  Informed Consent: I have reviewed the patients History and Physical, chart, labs and discussed the procedure including the risks, benefits and alternatives for the proposed anesthesia with the patient or authorized representative who has indicated his/her understanding and acceptance.     Dental advisory given  Plan Discussed with: CRNA  Anesthesia Plan Comments: (See PAT note from 1/30)         Anesthesia Quick Evaluation  "

## 2024-10-10 NOTE — Progress Notes (Signed)
 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 Subsequently diagnosed with seizures thought to be due to his CVA and was started on Keppra .   Patient follows with Cardiology at the [facility]. He had an abnormal stress test in 06/2024 and underwent LHC in 07/2024 which showed obstructive 2 vessel disease. Referred to [facility] and underwent CABG on 08/12/2024 with CT surgery. He was prescribed Plavix . Last seen on 09/22/2024 by PA Hicks-Wiggins. Per note:  -Endorses some continued SOB but able to walk on treadmill for without issues. No associated angina. -Would like to pursue rehab with [place] : Alfonso Byes [phone] - Will have MD enter cardiac rehab community care referral -Con't GDMT: BASA 81mg  + Plavix  75mg  daily x 6 months--likely decrease to solely aspirin  at next visit BB: metoprolol tartrate 25mg  bid ACEI/ARB: losartan 100mg  qd Statin: Praluent  150mg  + zetia  10mg  every day  Patient seen by CT surgery for post op visit on 09/18/2024. Noted to be doing well from surgical standpoint and advised f/u prn. Clearance obtained from CT surgery (physical copy is in chart) that patient is able to hold his Plavix  for 5 days. Of note patient reports he self d/c'd Plavix  on 1/18 because he didn't want to forget to stop it in  time. No issues with hematuria or bleeding.   Case discussed with Dr. Tilford. As long as clearance comes from prescribing provider (CT surgery) and addresses Plavix  hold, ok to proceed.  VS: BP 133/78   Pulse 87   Temp 36.7 C (Oral)   Resp 16   Ht 5' 8.5 (1.74 m)   Wt 81.2 kg   SpO2 99%   BMI 26.82 kg/m   PROVIDERS: Gerome Brunet, DO Cardiologist - Dr. Curtistine SQUIBB. [facility] [place] CT surgery -  Dr. Kincaide Follow up 09-18-24 epic   LABS: Labs reviewed: Acceptable for surgery. (all labs ordered are listed, but only abnormal results are displayed)  Labs Reviewed  BASIC METABOLIC PANEL WITH GFR - Abnormal; Notable for the following components:      Result Value   Glucose, Bld 167 (*)    BUN 32 (*)    All other components within normal limits  CBC  HEMOGLOBIN A1C     CXR 08/15/2024 ([facility]):  Impression 1.  Stable postsurgical cardiomediastinal silhouette. 2.  Similar left basilar hazy opacities, likely atelectasis.  EKG 08/15/24 ([facility]):  Sinus rhythm Normal ECG When compared with ECG of 04-Aug-2024 10 48, Nonspecific T wave abnormality now evident in inferior leads  TEE 08/12/24 ([facility]):  Pre-Intervention Summary An Omniplane TEE probe was inserted orally. A full TEE exam was performed including 2D, M-Mode, Color and Doppler. The left ventricle is grossly normal size. There is moderate concentric left ventricular hypertrophy. The  left ventricular ejection fraction is normal. Ejection Fraction = >55%. There is mild anterior wall hypokinesis. The right ventricle is grossly normal size. The right ventricular systolic function is normal. The interatrial septum is intact with no evidence for an atrial septal defect. The left atrium is mildly dilated. The right atrium is mildly dilated. There is moderate to severe mitral annular calcification. Calcified mitral apparatus. There is no mitral valve stenosis. There is mild mitral regurgitation. The tricuspid valve is not  well visualized, but is grossly normal. There is mild tricuspid regurgitation. The aortic valve is trileaflet. The aortic valve has sclerosis. Mild aortic regurgitation. Trace pulmonic valvular regurgitation. > Borderline aortic root dilatation. There is aortic root sclerosis-calcification. No obvious dissection could be visualized. Small pericardial effusion. There is no pleural effusion.  Post-Intervention Summary The left ventricular ejection fraction is normal. Ejection Fraction = >55%. The right ventricular systolic function is normal. There is mild mitral regurgitation. There is mild tricuspid regurgitation. Mild aortic regurgitation. Trace pulmonic valvular regurgitation. There is no pericardial effusion. There is no pleural effusion. Overall TEE Interpretation Summary A 2D transesophageal echocardiogram was performed. A 2D transesophageal echocardiogram with color flow Doppler was performed. A 2D transesophageal echocardiogram with Doppler and color flow Doppler was performed.  LEFT HEART CATH 07/16/2024 @ [facility]  CORONARY ANGIOGRAPHY  -------------- Native Vessels -------------- Summary: 2 vessel CAD Dominance: Mixed dominance  Stenoses Details ----------------------------------------------------------------------------- Segment Stenosis Length Characteristics and Comments ----------------------------------------------------------------------------- Left Main short left main LAD (overall) Proximal LAD 100% 70% prox LAD with subsequent CTO (100%) lesion after 2nd septal branch with left to left collaterals. CIRCUMFLEX (overall) Proximal Circumflex 20% Mid Circumflex 30% 1st Obtuse Marginal 50% 50% proximal OM1 2nd Obtuse Marginal 40% 40% proximal OM2 3rd Obtuse Marginal 50% 50% proximal OM3 RCA (overall) small, co-dominant vessel Ostial RCA 70% Proximal RCA 80% Mid RCA 90% tandem 90% mid-RCA  lesion ----------------------------------------------------------------------------- note: Stenosis = highest % stenosis within segment  PROCEDURE SUMMARY 1. CTO LAD with Left to left collaterals 2. RCA is a small co-dominant vessel with obstructive coronary artery disease 3. Elevated LVEDP ( )  RECOMMENDATIONS 1. Given obstructive 2v-coronary artery disease, patient was referred for cardiac surgery for consideration of revascularization of the LAD. 2. Continue medical management  Past Medical History:  Diagnosis Date   Aneurysm of ascending aorta    Arthritis    Bladder tumor    x3   Cancer (HCC)    bladder   Coronary artery disease    Diverticulitis 2007   with blockage   HLD (hyperlipidemia)    Hypertension    Myocardial infarction (HCC)    Paroxysmal SVT (supraventricular tachycardia) 2001   had ablation   Pneumonia    Pre-diabetes    Seizure (HCC) 10/02/2021   Sleep apnea    Stroke (HCC) 2023   difficulty swollowing, mouth droop anf numbness in hand Left side    Past Surgical History:  Procedure Laterality Date   ABCESS DRAINAGE Right 2022   groin   ABDOMINAL SURGERY  2007   with complications and sepsis   ABLATION FOR SVT  2001   APPENDECTOMY  1970s   CARPAL TUNNEL RELEASE Bilateral 09/20/2021   COLOSTOMY  2007   COLOSTOMY REVERSAL  2007   HERNIA MESH REMOVAL  2014   HERNIA REPAIR  2008   IR RADIOLOGIST EVAL & MGMT  11/30/2021   IRRIGATION AND DEBRIDEMENT ABSCESS N/A 07/18/2021   Procedure: IRRIGATION AND DEBRIDEMENT ABSCESS;  Surgeon: Watt Rush, MD;  Location:  [facility];  Service: Urology;  Laterality: N/A;   LUMBAR LAMINECTOMY  1970s   mini cardiac bypass     thoracotmy   LAD blockage dr. emmy   NESBIT PROCEDURE N/A    TRANSURETHRAL RESECTION OF BLADDER TUMOR WITH MITOMYCIN -C N/A 08/21/2018   Procedure: TRANSURETHRAL RESECTION OF BLADDER TUMOR WITH GEMCITABINE ;  Surgeon: Carolee Sherwood JONETTA DOUGLAS, MD;  Location[facility];  Service: Urology;   Laterality: N/A;   TRANSURETHRAL RESECTION OF BLADDER TUMOR WITH MITOMYCIN -C Bilateral 03/20/2022   Procedure: TRANSURETHRAL RESECTION OF BLADDER TUMOR WITH GEMCITABINE  BILATERAL RETROGRADE PYELOGRAM right ureteroscopy;  Surgeon: Carolee Sherwood JONETTA DOUGLAS, MD;  Location[facility];  Service: Urology;  Laterality: Bilateral;  45 MINS FOR CASE    MEDICATIONS:  Alirocumab  (PRALUENT ) 150 MG/ML SOAJ   aspirin  EC 81 MG tablet   azelastine (ASTELIN) 0.1 % nasal spray   chlorthalidone  (HYGROTON ) 25 MG tablet   clopidogrel  (PLAVIX ) 75 MG tablet   ezetimibe  (ZETIA ) 10 MG tablet   fluticasone  (FLONASE ) 50 MCG/ACT nasal spray   furosemide (LASIX) 20 MG tablet   losartan (COZAAR) 100 MG tablet   MAGNESIUM GLYCINATE PO   metoprolol succinate (TOPROL-XL) 50 MG 24 hr tablet   Multiple Vitamin (MULTIVITAMIN) tablet   omeprazole (PRILOSEC) 20 MG capsule   tadalafil (CIALIS) 20 MG tablet    gemcitabine  (GEMZAR ) chemo syringe for bladder instillation 2,000 mg   Jace Dowe M Britne Borelli, PA-C [facility] Short Stay/Anesthesiology [facility] Phone [phone] 10/10/2024 2:23 PM        "

## 2024-10-13 ENCOUNTER — Ambulatory Visit (HOSPITAL_COMMUNITY)

## 2024-10-13 ENCOUNTER — Ambulatory Visit (HOSPITAL_COMMUNITY): Admitting: Anesthesiology

## 2024-10-13 ENCOUNTER — Encounter (HOSPITAL_COMMUNITY)
Admission: RE | Disposition: A | Discharge status: Home / Self Care | Payer: Self-pay | Source: Home / Self Care | Attending: Urology

## 2024-10-13 ENCOUNTER — Ambulatory Visit (HOSPITAL_COMMUNITY)
Admission: RE | Admit: 2024-10-13 | Discharge: 2024-10-13 | Disposition: A | Discharge status: Home / Self Care | Attending: Urology | Admitting: Urology

## 2024-10-13 ENCOUNTER — Encounter (HOSPITAL_COMMUNITY)

## 2024-10-13 ENCOUNTER — Other Ambulatory Visit: Payer: Self-pay

## 2024-10-13 ENCOUNTER — Encounter (HOSPITAL_COMMUNITY): Admitting: Medical

## 2024-10-13 ENCOUNTER — Encounter (HOSPITAL_COMMUNITY): Payer: Self-pay | Admitting: Urology

## 2024-10-13 DIAGNOSIS — D303 Benign neoplasm of bladder: Secondary | ICD-10-CM | POA: Insufficient documentation

## 2024-10-13 DIAGNOSIS — I2510 Atherosclerotic heart disease of native coronary artery without angina pectoris: Secondary | ICD-10-CM

## 2024-10-13 DIAGNOSIS — I69334 Monoplegia of upper limb following cerebral infarction affecting left non-dominant side: Secondary | ICD-10-CM | POA: Insufficient documentation

## 2024-10-13 DIAGNOSIS — I10 Essential (primary) hypertension: Secondary | ICD-10-CM | POA: Insufficient documentation

## 2024-10-13 DIAGNOSIS — Z87891 Personal history of nicotine dependence: Secondary | ICD-10-CM | POA: Insufficient documentation

## 2024-10-13 DIAGNOSIS — I252 Old myocardial infarction: Secondary | ICD-10-CM | POA: Insufficient documentation

## 2024-10-13 DIAGNOSIS — R569 Unspecified convulsions: Secondary | ICD-10-CM | POA: Insufficient documentation

## 2024-10-13 DIAGNOSIS — E119 Type 2 diabetes mellitus without complications: Secondary | ICD-10-CM | POA: Insufficient documentation

## 2024-10-13 DIAGNOSIS — K219 Gastro-esophageal reflux disease without esophagitis: Secondary | ICD-10-CM | POA: Insufficient documentation

## 2024-10-13 DIAGNOSIS — G4730 Sleep apnea, unspecified: Secondary | ICD-10-CM | POA: Insufficient documentation

## 2024-10-13 DIAGNOSIS — I69391 Dysphagia following cerebral infarction: Secondary | ICD-10-CM | POA: Insufficient documentation

## 2024-10-13 DIAGNOSIS — Z7902 Long term (current) use of antithrombotics/antiplatelets: Secondary | ICD-10-CM | POA: Insufficient documentation

## 2024-10-13 DIAGNOSIS — J449 Chronic obstructive pulmonary disease, unspecified: Secondary | ICD-10-CM

## 2024-10-13 DIAGNOSIS — R1310 Dysphagia, unspecified: Secondary | ICD-10-CM | POA: Insufficient documentation

## 2024-10-13 DIAGNOSIS — Z951 Presence of aortocoronary bypass graft: Secondary | ICD-10-CM | POA: Insufficient documentation

## 2024-10-13 DIAGNOSIS — I69392 Facial weakness following cerebral infarction: Secondary | ICD-10-CM | POA: Insufficient documentation

## 2024-10-13 DIAGNOSIS — D494 Neoplasm of unspecified behavior of bladder: Secondary | ICD-10-CM

## 2024-10-13 LAB — GLUCOSE, CAPILLARY: Glucose-Capillary: 187 mg/dL — ABNORMAL HIGH (ref 70–99)

## 2024-10-13 MED ORDER — ONDANSETRON HCL 4 MG/2ML IJ SOLN
INTRAMUSCULAR | Status: DC | PRN
  Administered 2024-10-13: 4 mg via INTRAVENOUS

## 2024-10-13 MED ORDER — DEXAMETHASONE SOD PHOSPHATE PF 10 MG/ML IJ SOLN
INTRAMUSCULAR | Status: AC
  Filled 2024-10-13: qty 1

## 2024-10-13 MED ORDER — EPHEDRINE SULFATE (PRESSORS) 25 MG/5ML IV SOSY
PREFILLED_SYRINGE | INTRAVENOUS | Status: DC | PRN
  Administered 2024-10-13: 5 mg via INTRAVENOUS

## 2024-10-13 MED ORDER — FENTANYL CITRATE (PF) 100 MCG/2ML IJ SOLN
INTRAMUSCULAR | Status: AC
  Filled 2024-10-13: qty 2

## 2024-10-13 MED ORDER — CEFAZOLIN SODIUM-DEXTROSE 2-4 GM/100ML-% IV SOLN
2.0000 g | INTRAVENOUS | Status: AC
  Administered 2024-10-13: 2 g via INTRAVENOUS
  Filled 2024-10-13: qty 100

## 2024-10-13 MED ORDER — OXYCODONE HCL 5 MG PO TABS: 5.0000 mg | ORAL_TABLET | Freq: Once | ORAL | Status: DC | PRN

## 2024-10-13 MED ORDER — LACTATED RINGERS IV SOLN: INTRAVENOUS | Status: DC

## 2024-10-13 MED ORDER — FENTANYL CITRATE (PF) 100 MCG/2ML IJ SOLN
INTRAMUSCULAR | Status: DC | PRN
  Administered 2024-10-13 (×2): 50 ug via INTRAVENOUS

## 2024-10-13 MED ORDER — PROPOFOL 10 MG/ML IV BOLUS
INTRAVENOUS | Status: DC | PRN
  Administered 2024-10-13: 100 mg via INTRAVENOUS

## 2024-10-13 MED ORDER — OXYCODONE HCL 5 MG/5ML PO SOLN: 5.0000 mg | Freq: Once | ORAL | Status: DC | PRN

## 2024-10-13 MED ORDER — DEXAMETHASONE SOD PHOSPHATE PF 10 MG/ML IJ SOLN
INTRAMUSCULAR | Status: DC | PRN
  Administered 2024-10-13: 5 mg via INTRAVENOUS

## 2024-10-13 MED ORDER — INSULIN ASPART 100 UNIT/ML IJ SOLN
INTRAMUSCULAR | Status: AC
  Filled 2024-10-13: qty 2

## 2024-10-13 MED ORDER — STERILE WATER FOR IRRIGATION IR SOLN
Status: DC | PRN
  Administered 2024-10-13: 250 mL

## 2024-10-13 MED ORDER — 0.9 % SODIUM CHLORIDE (POUR BTL) OPTIME
TOPICAL | Status: DC | PRN
  Administered 2024-10-13: 1000 mL

## 2024-10-13 MED ORDER — ONDANSETRON HCL 4 MG/2ML IJ SOLN
INTRAMUSCULAR | Status: AC
  Filled 2024-10-13: qty 2

## 2024-10-13 MED ORDER — PROPOFOL 10 MG/ML IV BOLUS
INTRAVENOUS | Status: AC
  Filled 2024-10-13: qty 20

## 2024-10-13 MED ORDER — CHLORHEXIDINE GLUCONATE 0.12 % MT SOLN
15.0000 mL | Freq: Once | OROMUCOSAL | Status: AC
  Administered 2024-10-13: 15 mL via OROMUCOSAL

## 2024-10-13 MED ORDER — GEMCITABINE CHEMO FOR BLADDER INSTILLATION 2000 MG
2000.0000 mg | Freq: Once | INTRAVENOUS | Status: AC
  Administered 2024-10-13: 2000 mg via INTRAVESICAL
  Filled 2024-10-13: qty 2000

## 2024-10-13 MED ORDER — SUGAMMADEX SODIUM 200 MG/2ML IV SOLN
INTRAVENOUS | Status: AC
  Filled 2024-10-13: qty 4

## 2024-10-13 MED ORDER — LACTATED RINGERS IV SOLN: INTRAVENOUS | Status: DC | PRN

## 2024-10-13 MED ORDER — INSULIN ASPART 100 UNIT/ML IJ SOLN
0.0000 [IU] | INTRAMUSCULAR | Status: DC | PRN
  Administered 2024-10-13: 2 [IU] via SUBCUTANEOUS

## 2024-10-13 MED ORDER — ACETAMINOPHEN 10 MG/ML IV SOLN: 1000.0000 mg | Freq: Once | INTRAVENOUS | Status: DC | PRN

## 2024-10-13 MED ORDER — IOHEXOL 300 MG/ML  SOLN
INTRAMUSCULAR | Status: DC | PRN
  Administered 2024-10-13: 18 mL

## 2024-10-13 MED ORDER — FENTANYL CITRATE (PF) 50 MCG/ML IJ SOSY: 25.0000 ug | PREFILLED_SYRINGE | INTRAMUSCULAR | Status: DC | PRN

## 2024-10-13 MED ORDER — SUGAMMADEX SODIUM 200 MG/2ML IV SOLN
INTRAVENOUS | Status: DC | PRN
  Administered 2024-10-13: 330 mg via INTRAVENOUS

## 2024-10-13 MED ORDER — LIDOCAINE HCL (CARDIAC) PF 100 MG/5ML IV SOSY
PREFILLED_SYRINGE | INTRAVENOUS | Status: DC | PRN
  Administered 2024-10-13: 100 mg via INTRAVENOUS

## 2024-10-13 MED ORDER — ORAL CARE MOUTH RINSE: 15.0000 mL | Freq: Once | OROMUCOSAL | Status: AC

## 2024-10-13 MED ORDER — HYDROCODONE-ACETAMINOPHEN 5-325 MG PO TABS: 1.0000 | ORAL_TABLET | Freq: Four times a day (QID) | ORAL | 0 refills | Status: AC | PRN

## 2024-10-13 MED ORDER — SODIUM CHLORIDE 0.9 % IR SOLN
Status: DC | PRN
  Administered 2024-10-13 (×2): 3000 mL via INTRAVESICAL

## 2024-10-13 MED ORDER — ROCURONIUM BROMIDE 100 MG/10ML IV SOLN
INTRAVENOUS | Status: DC | PRN
  Administered 2024-10-13: 70 mg via INTRAVENOUS

## 2024-10-13 NOTE — Op Note (Signed)
 Operative Note  Preoperative diagnosis:  1.  Bladder tumor  Postoperative diagnosis: 1.  Bladder tumor--small  Procedure(s): 1.  Cystoscopy with bilateral retrograde pyelogram 2.  Transurethral resection of bladder tumor-a small, 1 cm 3.  Intravesical instillation of gemcitabine   Surgeon: Sherwood Edison, MD  Assistants: None  Anesthesia: General  Complications: None immediate  EBL: Minimal  Specimens: 1.  Bladder tumor  Drains/Catheters: 1.  18 French Foley catheter  Intraoperative findings: 1.  Normal anterior urethra 2.  Short moderately obstructing prostate with bilobar hypertrophy and no median lobe 3.  Bladder mucosa with approxi-1 cm papillary lesion just medial to the left ureteral orifice. 4.  Left retrograde pyelogram without any filling defect or hydronephrosis 5.  Right retrograde pyelogram without any filling defect or hydronephrosis  Indication: 79 year old male with a bladder tumor presents for the previously mentioned operation.  Description of procedure:  The patient was identified and consent was obtained.  The patient was taken to the operating room and placed in the supine position.  The patient was placed under general anesthesia.  Perioperative antibiotics were administered.  The patient was placed in dorsal lithotomy.  Patient was prepped and draped in a standard sterile fashion and a timeout was performed.  A 21 French rigid cystoscope was advanced into the urethra and into the bladder.  Complete cystoscopy was performed with findings noted above.  Left ureteral orifice was intubated with an open-ended ureteral catheter and a retrograde pyelogram was performed with no abnormal findings.  Same was performed on the right again with no abnormal findings.  I withdrew the scope and advanced a 21 French resectoscope with a visual obturator and placed into the urethra and into the bladder.  Extracted with bipolar working element and resected the bladder tumor and  fulgurated the resection bed.  Resection was close to the left ureteral orifice that did not involve it.  I collected the specimen and withdrew the scope and placed an 40 French silicone catheter.  This concluded the operation.  Patient tolerated the procedure well was stable postoperatively.  In the PACU, I instilled gemcitabine  into the bladder where it remained for approximately an hour prior to proper disposal.  Plan: Follow-up in 1 week for pathology review

## 2024-10-13 NOTE — Anesthesia Postprocedure Evaluation (Signed)
"   Anesthesia Post Note  Patient: Richard Austin  Procedure(s) Performed: TURBT, WITH CHEMOTHERAPEUTIC AGENT INSTILLATION INTO BLADDER (Bladder) CYSTOSCOPY, WITH RETROGRADE PYELOGRAM (Bilateral: Urethra)     Patient location during evaluation: PACU Anesthesia Type: General Level of consciousness: awake and alert Pain management: pain level controlled Vital Signs Assessment: post-procedure vital signs reviewed and stable Respiratory status: spontaneous breathing, nonlabored ventilation and respiratory function stable Cardiovascular status: blood pressure returned to baseline and stable Postop Assessment: no apparent nausea or vomiting Anesthetic complications: no   No notable events documented.  Last Vitals:  Vitals:   10/13/24 1015 10/13/24 1029  BP: 128/69 (!) 144/63  Pulse: 63 62  Resp: 19 20  Temp: (!) 36.3 C (!) 36.3 C  SpO2: 97% 98%    Last Pain:  Vitals:   10/13/24 1029  TempSrc:   PainSc: 0-No pain                 Gazella Anglin      "

## 2024-10-13 NOTE — Anesthesia Procedure Notes (Signed)
 Procedure Name: Intubation Date/Time: 10/13/2024 7:56 AM  Performed by: Dartha Meckel, CRNAPre-anesthesia Checklist: Patient identified, Emergency Drugs available, Suction available and Patient being monitored Patient Re-evaluated:Patient Re-evaluated prior to induction Oxygen Delivery Method: Circle system utilized Preoxygenation: Pre-oxygenation with 100% oxygen Induction Type: IV induction Ventilation: Mask ventilation without difficulty Laryngoscope Size: Mac and 4 Grade View: Grade I Tube type: Oral Tube size: 7.5 mm Number of attempts: 1 Airway Equipment and Method: Stylet and Oral airway Placement Confirmation: ETT inserted through vocal cords under direct vision, positive ETCO2 and breath sounds checked- equal and bilateral Secured at: 21 cm Tube secured with: Tape Dental Injury: Teeth and Oropharynx as per pre-operative assessment

## 2024-10-13 NOTE — Discharge Instructions (Signed)

## 2024-10-14 ENCOUNTER — Encounter (HOSPITAL_COMMUNITY): Payer: Self-pay | Admitting: Urology

## 2024-10-14 LAB — SURGICAL PATHOLOGY

## 2024-10-15 ENCOUNTER — Encounter (HOSPITAL_COMMUNITY)

## 2024-10-17 ENCOUNTER — Encounter (HOSPITAL_COMMUNITY)

## 2024-10-20 ENCOUNTER — Encounter (HOSPITAL_COMMUNITY)

## 2024-10-22 ENCOUNTER — Encounter (HOSPITAL_COMMUNITY)

## 2024-10-24 ENCOUNTER — Encounter (HOSPITAL_COMMUNITY)

## 2024-10-27 ENCOUNTER — Encounter (HOSPITAL_COMMUNITY)

## 2024-10-29 ENCOUNTER — Encounter (HOSPITAL_COMMUNITY)

## 2024-10-31 ENCOUNTER — Encounter (HOSPITAL_COMMUNITY)

## 2024-11-03 ENCOUNTER — Encounter (HOSPITAL_COMMUNITY)

## 2024-11-05 ENCOUNTER — Encounter (HOSPITAL_COMMUNITY)

## 2024-11-07 ENCOUNTER — Encounter (HOSPITAL_COMMUNITY)

## 2024-11-10 ENCOUNTER — Encounter (HOSPITAL_COMMUNITY)

## 2024-11-12 ENCOUNTER — Encounter (HOSPITAL_COMMUNITY)

## 2024-11-14 ENCOUNTER — Encounter (HOSPITAL_COMMUNITY)

## 2024-11-17 ENCOUNTER — Encounter (HOSPITAL_COMMUNITY)

## 2024-11-19 ENCOUNTER — Encounter (HOSPITAL_COMMUNITY)

## 2024-11-21 ENCOUNTER — Encounter (HOSPITAL_COMMUNITY)

## 2024-11-24 ENCOUNTER — Encounter (HOSPITAL_COMMUNITY)

## 2024-11-26 ENCOUNTER — Encounter (HOSPITAL_COMMUNITY)

## 2024-11-28 ENCOUNTER — Encounter (HOSPITAL_COMMUNITY)

## 2024-12-01 ENCOUNTER — Encounter (HOSPITAL_COMMUNITY)

## 2024-12-03 ENCOUNTER — Encounter (HOSPITAL_COMMUNITY)

## 2024-12-05 ENCOUNTER — Encounter (HOSPITAL_COMMUNITY)

## 2024-12-08 ENCOUNTER — Encounter (HOSPITAL_COMMUNITY)

## 2024-12-10 ENCOUNTER — Encounter (HOSPITAL_COMMUNITY)

## 2024-12-12 ENCOUNTER — Encounter (HOSPITAL_COMMUNITY)

## 2024-12-15 ENCOUNTER — Encounter (HOSPITAL_COMMUNITY)

## 2024-12-17 ENCOUNTER — Encounter (HOSPITAL_COMMUNITY)

## 2024-12-19 ENCOUNTER — Encounter (HOSPITAL_COMMUNITY)

## 2024-12-22 ENCOUNTER — Encounter (HOSPITAL_COMMUNITY)

## 2024-12-24 ENCOUNTER — Encounter (HOSPITAL_COMMUNITY)

## 2024-12-26 ENCOUNTER — Encounter (HOSPITAL_COMMUNITY)

## 2024-12-29 ENCOUNTER — Encounter (HOSPITAL_COMMUNITY)

## 2024-12-31 ENCOUNTER — Encounter (HOSPITAL_COMMUNITY)

## 2025-01-02 ENCOUNTER — Encounter (HOSPITAL_COMMUNITY)

## 2025-01-05 ENCOUNTER — Encounter (HOSPITAL_COMMUNITY)

## 2025-01-07 ENCOUNTER — Encounter (HOSPITAL_COMMUNITY)
